# Patient Record
Sex: Female | Born: 1950 | Race: White | Hispanic: No | Marital: Married | Smoking: Never smoker
Health system: Southern US, Community
[De-identification: ages and names within clinical notes are randomized; demographics above are authoritative.]

## PROBLEM LIST (undated history)

## (undated) DIAGNOSIS — I5189 Other ill-defined heart diseases: Secondary | ICD-10-CM

## (undated) DIAGNOSIS — Z85828 Personal history of other malignant neoplasm of skin: Secondary | ICD-10-CM

## (undated) DIAGNOSIS — R002 Palpitations: Secondary | ICD-10-CM

## (undated) DIAGNOSIS — M8580 Other specified disorders of bone density and structure, unspecified site: Secondary | ICD-10-CM

## (undated) DIAGNOSIS — R0981 Nasal congestion: Secondary | ICD-10-CM

## (undated) DIAGNOSIS — L570 Actinic keratosis: Secondary | ICD-10-CM

## (undated) DIAGNOSIS — C801 Malignant (primary) neoplasm, unspecified: Secondary | ICD-10-CM

## (undated) DIAGNOSIS — K219 Gastro-esophageal reflux disease without esophagitis: Secondary | ICD-10-CM

## (undated) DIAGNOSIS — G4730 Sleep apnea, unspecified: Secondary | ICD-10-CM

## (undated) DIAGNOSIS — I10 Essential (primary) hypertension: Secondary | ICD-10-CM

## (undated) DIAGNOSIS — R0789 Other chest pain: Secondary | ICD-10-CM

## (undated) DIAGNOSIS — T7840XA Allergy, unspecified, initial encounter: Secondary | ICD-10-CM

## (undated) DIAGNOSIS — N289 Disorder of kidney and ureter, unspecified: Secondary | ICD-10-CM

## (undated) HISTORY — DX: Palpitations: R00.2

## (undated) HISTORY — PX: BASAL CELL CARCINOMA EXCISION: SHX1214

## (undated) HISTORY — DX: Other chest pain: R07.89

## (undated) HISTORY — DX: Other ill-defined heart diseases: I51.89

## (undated) HISTORY — PX: TONSILLECTOMY AND ADENOIDECTOMY: SHX28

## (undated) HISTORY — DX: Gastro-esophageal reflux disease without esophagitis: K21.9

## (undated) HISTORY — DX: Allergy, unspecified, initial encounter: T78.40XA

## (undated) HISTORY — DX: Essential (primary) hypertension: I10

## (undated) HISTORY — DX: Actinic keratosis: L57.0

## (undated) HISTORY — DX: Other specified disorders of bone density and structure, unspecified site: M85.80

## (undated) HISTORY — DX: Sleep apnea, unspecified: G47.30

## (undated) HISTORY — DX: Nasal congestion: R09.81

---
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
## 2000-02-27 DIAGNOSIS — Z85828 Personal history of other malignant neoplasm of skin: Secondary | ICD-10-CM

## 2000-02-27 HISTORY — DX: Personal history of other malignant neoplasm of skin: Z85.828

## 2006-03-21 ENCOUNTER — Encounter: Payer: Self-pay | Admitting: General Practice

## 2006-03-29 ENCOUNTER — Encounter: Payer: Self-pay | Admitting: General Practice

## 2008-09-27 ENCOUNTER — Ambulatory Visit: Payer: Self-pay | Admitting: Gastroenterology

## 2009-08-17 ENCOUNTER — Ambulatory Visit: Payer: Self-pay | Admitting: Family Medicine

## 2010-08-18 ENCOUNTER — Ambulatory Visit: Payer: Self-pay | Admitting: Family Medicine

## 2010-09-20 ENCOUNTER — Ambulatory Visit: Payer: Self-pay | Admitting: Family Medicine

## 2010-12-15 ENCOUNTER — Ambulatory Visit: Payer: Self-pay | Admitting: Anesthesiology

## 2010-12-22 ENCOUNTER — Ambulatory Visit: Payer: Self-pay | Admitting: Unknown Physician Specialty

## 2011-01-24 ENCOUNTER — Encounter: Payer: Self-pay | Admitting: Unknown Physician Specialty

## 2011-01-27 ENCOUNTER — Encounter: Payer: Self-pay | Admitting: Unknown Physician Specialty

## 2011-10-16 ENCOUNTER — Ambulatory Visit: Payer: Self-pay | Admitting: Family Medicine

## 2012-06-09 ENCOUNTER — Ambulatory Visit: Payer: Self-pay

## 2012-09-23 ENCOUNTER — Ambulatory Visit: Payer: Self-pay | Admitting: Family Medicine

## 2012-10-16 ENCOUNTER — Ambulatory Visit: Payer: Self-pay | Admitting: Family Medicine

## 2013-10-22 ENCOUNTER — Ambulatory Visit: Payer: Self-pay | Admitting: Family Medicine

## 2013-12-25 ENCOUNTER — Ambulatory Visit: Payer: Self-pay | Admitting: Unknown Physician Specialty

## 2014-01-29 ENCOUNTER — Ambulatory Visit: Payer: Self-pay | Admitting: Unknown Physician Specialty

## 2014-10-13 ENCOUNTER — Encounter: Payer: Self-pay | Admitting: Family Medicine

## 2014-10-13 ENCOUNTER — Ambulatory Visit (INDEPENDENT_AMBULATORY_CARE_PROVIDER_SITE_OTHER): Payer: PRIVATE HEALTH INSURANCE | Admitting: Family Medicine

## 2014-10-13 VITALS — BP 136/74 | HR 81 | Temp 98.7°F | Ht 63.3 in | Wt 141.0 lb

## 2014-10-13 DIAGNOSIS — Z0000 Encounter for general adult medical examination without abnormal findings: Secondary | ICD-10-CM

## 2014-10-13 DIAGNOSIS — G4733 Obstructive sleep apnea (adult) (pediatric): Secondary | ICD-10-CM

## 2014-10-13 DIAGNOSIS — Z9989 Dependence on other enabling machines and devices: Secondary | ICD-10-CM

## 2014-10-13 NOTE — Progress Notes (Signed)
   BP 136/74 mmHg  Pulse 81  Temp(Src) 98.7 F (37.1 C)  Ht 5' 3.3" (1.608 m)  Wt 141 lb (63.957 kg)  BMI 24.74 kg/m2  SpO2 99%   Subjective:    Patient ID: Terri Melendez, female    DOB: [birth year], 64 y.o.   MRN: [id number]  HPI: Terri Melendez is a 64 y.o. female  Chief Complaint  Patient presents with  . Annual Exam   patient doing well with no specific complaints has recently been diagnosed with  sleep apnea using CPAP and doing well. Uses faithfully. Taking allergy shots and nose spray with good relief.   Relevant past medical, surgical, family and social history reviewed and updated as indicated. Interim medical history since our last visit reviewed. Allergies and medications reviewed and updated.  Review of Systems  Constitutional: Negative.   HENT: Negative.   Eyes: Negative.   Respiratory: Negative.   Cardiovascular: Negative.   Gastrointestinal: Negative.   Endocrine: Negative.   Genitourinary: Negative.   Musculoskeletal: Negative.   Skin: Negative.   Allergic/Immunologic: Negative.   Neurological: Negative.   Hematological: Negative.   Psychiatric/Behavioral: Negative.     Per HPI unless specifically indicated above     Objective:    BP 136/74 mmHg  Pulse 81  Temp(Src) 98.7 F (37.1 C)  Ht 5' 3.3" (1.608 m)  Wt 141 lb (63.957 kg)  BMI 24.74 kg/m2  SpO2 99%  Wt Readings from Last 3 Encounters:  10/13/14 141 lb (63.957 kg)  09/08/13 139 lb (63.05 kg)    Physical Exam  Constitutional: She is oriented to person, place, and time. She appears well-developed and well-nourished.  HENT:  Head: Normocephalic and atraumatic.  Right Ear: External ear normal.  Left Ear: External ear normal.  Nose: Nose normal.  Mouth/Throat: Oropharynx is clear and moist.  Eyes: Conjunctivae and EOM are normal. Pupils are equal, round, and reactive to light.  Neck: Normal range of motion. Neck supple. Carotid bruit is not present.  Cardiovascular:  Normal rate, regular rhythm and normal heart sounds.   No murmur heard. Pulmonary/Chest: Effort normal and breath sounds normal.  Abdominal: Soft. Bowel sounds are normal. There is no hepatosplenomegaly.  Musculoskeletal: Normal range of motion.  Neurological: She is alert and oriented to person, place, and time.  Skin: No rash noted.  Psychiatric: She has a normal mood and affect. Her behavior is normal. Judgment and thought content normal.    No results found for this or any previous visit.    Assessment & Plan:   Problem List Items Addressed This Visit      Respiratory   OSA on CPAP    The current medical regimen is effective;  continue present plan and medications.        Other Visit Diagnoses    PE (physical exam), annual    -  Primary      patient will have physical exam labs done at work  Follow up plan: Return if symptoms worsen or fail to improve.

## 2014-10-13 NOTE — Assessment & Plan Note (Signed)
The current medical regimen is effective;  continue present plan and medications.  

## 2015-02-14 ENCOUNTER — Encounter: Payer: Self-pay | Admitting: Physician Assistant

## 2015-02-14 ENCOUNTER — Ambulatory Visit: Payer: Self-pay | Admitting: Physician Assistant

## 2015-02-14 VITALS — BP 120/70 | HR 111 | Temp 98.9°F

## 2015-02-14 DIAGNOSIS — J069 Acute upper respiratory infection, unspecified: Secondary | ICD-10-CM

## 2015-02-14 MED ORDER — FLUTICASONE PROPIONATE 50 MCG/ACT NA SUSP: 2.0000 | Freq: Every day | NASAL | Status: DC

## 2015-02-14 MED ORDER — AZITHROMYCIN 250 MG PO TABS: ORAL_TABLET | ORAL | Status: DC

## 2015-02-14 NOTE — Progress Notes (Signed)
S: C/o runny nose and congestion for 3 days, + fever, chills, temp was around 100, denies cp/sob, v/d; mucus is mostly clear and bloody, cough is sporadic, c/o of facial and dental pain.   Using otc meds: sudafed, afrin  O: PE: vitals w elevated hr perrl eomi, normocephalic, tms dull, nasal mucosa red and swollen, throat injected, neck supple no lymph, lungs c t a, cv rrr, neuro intact  A:  Acute sinusitis, uri   P: zpack, flonase, stop sudafed; drink fluids, continue regular meds , use otc meds of choice, return if not improving in 5 days, return earlier if worsening

## 2015-04-18 ENCOUNTER — Ambulatory Visit: Payer: Self-pay | Admitting: Physician Assistant

## 2015-04-18 ENCOUNTER — Encounter: Payer: Self-pay | Admitting: Physician Assistant

## 2015-04-18 VITALS — BP 150/70 | HR 91 | Temp 98.3°F

## 2015-04-18 DIAGNOSIS — M545 Low back pain, unspecified: Secondary | ICD-10-CM

## 2015-04-18 LAB — POCT URINALYSIS DIPSTICK
BILIRUBIN UA: NEGATIVE
GLUCOSE UA: NEGATIVE
Ketones, UA: NEGATIVE
LEUKOCYTES UA: NEGATIVE
NITRITE UA: NEGATIVE
PH UA: 5.5
Protein, UA: NEGATIVE
Spec Grav, UA: 1.01
UROBILINOGEN UA: 0.2

## 2015-04-18 MED ORDER — MELOXICAM 15 MG PO TABS: 15.0000 mg | ORAL_TABLET | Freq: Every day | ORAL | Status: DC

## 2015-04-18 MED ORDER — CYCLOBENZAPRINE HCL 10 MG PO TABS: 10.0000 mg | ORAL_TABLET | Freq: Three times a day (TID) | ORAL | Status: DC | PRN

## 2015-04-18 NOTE — Progress Notes (Signed)
S:  C/o low back pain for few days no known injury, pain is worse with movement, increased with bending over, denies numbness, tingling, or changes in bowel/urinary habits, pain wrap around hip and into llq; tried a tylenol and aleve without any relief, pain makes it difficult to sleep bc she can't get comfortable, denies hx of kidney stones, no uti sx Remainder ros neg  O:  Vitals wnl, nad, lungs c t a, cv rrr, spine nontender, muscles in lower back spasmed , decreased rom with,  Neg slr, pt walks without difficulty, no foot drop noted, n/v intact, ua 1+ blood  A: acute back pain, hematuria  P: flexeril , mobic, use wet heat followed by ice, stretches, return to clinic if not better in 3 t 5 days, return earlier if worsening, return in one week to evaluate blood in urine, no hx of stones

## 2015-04-18 NOTE — Addendum Note (Signed)
Addended by: Rudene Anda T on: 04/18/2015 11:49 AM   Modules accepted: Orders

## 2015-04-21 ENCOUNTER — Ambulatory Visit: Payer: PRIVATE HEALTH INSURANCE | Admitting: Family Medicine

## 2015-04-25 ENCOUNTER — Encounter: Payer: Self-pay | Admitting: Physician Assistant

## 2015-04-25 ENCOUNTER — Ambulatory Visit: Payer: Self-pay | Admitting: Physician Assistant

## 2015-04-25 VITALS — BP 160/80 | HR 106 | Temp 97.9°F

## 2015-04-25 DIAGNOSIS — R319 Hematuria, unspecified: Secondary | ICD-10-CM

## 2015-04-25 LAB — POCT URINALYSIS DIPSTICK
Bilirubin, UA: NEGATIVE
Glucose, UA: NEGATIVE
Ketones, UA: NEGATIVE
Leukocytes, UA: NEGATIVE
Nitrite, UA: NEGATIVE
Protein, UA: NEGATIVE
Spec Grav, UA: 1.015
Urobilinogen, UA: 0.2
pH, UA: 6.5

## 2015-04-25 NOTE — Progress Notes (Signed)
   Subjective: back pain    Patient ID: Terri Melendez, female    DOB: [birth year], 65 y.o.   MRN: [id number]  HPI Patient follow up status one week for back pain. Patient was prescribed Mobic and Flexeril. States back pain has resolved. Also there was a concern 2nd to 1+ blood on dip UA on day of last visit. Patient denies urinary compliant.   Review of Systems Back back and GERD    Objective:   Physical Exam No acute distress. No spinal deformity, no CVA guarding, F/E ROM without spasms. Dip UA revealed Trace Blood.       Assessment & Plan: Resolved back pain  Discontinue Mobic and Flexeril.  Follow up with Family Doctor.

## 2015-09-20 ENCOUNTER — Encounter (INDEPENDENT_AMBULATORY_CARE_PROVIDER_SITE_OTHER): Payer: Self-pay

## 2015-10-05 ENCOUNTER — Ambulatory Visit (INDEPENDENT_AMBULATORY_CARE_PROVIDER_SITE_OTHER): Payer: Managed Care, Other (non HMO) | Admitting: Family Medicine

## 2015-10-05 ENCOUNTER — Encounter: Payer: Self-pay | Admitting: Family Medicine

## 2015-10-05 VITALS — BP 150/78 | HR 81 | Temp 98.0°F | Wt 141.0 lb

## 2015-10-05 DIAGNOSIS — M545 Low back pain, unspecified: Secondary | ICD-10-CM

## 2015-10-05 DIAGNOSIS — S39012A Strain of muscle, fascia and tendon of lower back, initial encounter: Secondary | ICD-10-CM | POA: Diagnosis not present

## 2015-10-05 DIAGNOSIS — S90122A Contusion of left lesser toe(s) without damage to nail, initial encounter: Secondary | ICD-10-CM | POA: Diagnosis not present

## 2015-10-05 LAB — URINALYSIS, ROUTINE W REFLEX MICROSCOPIC
BILIRUBIN UA: NEGATIVE
GLUCOSE, UA: NEGATIVE
KETONES UA: NEGATIVE
Leukocytes, UA: NEGATIVE
Nitrite, UA: NEGATIVE
PH UA: 5 (ref 5.0–7.5)
PROTEIN UA: NEGATIVE
Specific Gravity, UA: <1.005 — ABNORMAL LOW (ref 1.005–1.030)
UUROB: 0.2 mg/dL (ref 0.2–1.0)

## 2015-10-05 LAB — MICROSCOPIC EXAMINATION: WBC UA: NONE SEEN /HPF (ref 0–?)

## 2015-10-05 NOTE — Progress Notes (Signed)
BP (!) 150/78 (BP Location: Left Arm, Patient Position: Sitting, Cuff Size: Small)   Pulse 81   Temp 98 F (36.7 C)   Wt 141 lb (64 kg)   SpO2 96%   BMI 24.74 kg/m    Subjective:    Patient ID: Terri Melendez, female    DOB: Sep 23, 1950, 65 y.o.   MRN: [id number]  HPI: ARYKAH Melendez is a 65 y.o. female  Chief Complaint  Patient presents with  . Back Pain    lower back, right hip  Patient approximately 5 years ago developed some right hip pain treated with physical therapy got better and nearly completely resolved. Over the last year has had some intermittent right hip pain something like what was going on 5 years ago has done some home PT exercises which helped and is only intermittent problems now. In February this year some unknown incident caused right lower back pain treated in the employee clinic with meloxicam and Flexeril which to helped and symptoms resolved after a week or so. Patient had normal urinalysis during this time which was done 2 times to confirm everything was normal. All was well except for intermittent right hip pain until 5 days ago with slipping and sock feet on her floor at home stubbing her left second toe not falling but catching her self and reaching her back with acute onset of pain in the same area right lower back. Had some leftover Flexeril and meloxicam which is helped is somewhat better. Has not noticed any blood in stool or urine has some mild bowel changes this week but nothing particular. No radicular symptoms no weakness noted in legs. No loss of bowel or bladder control.  Relevant past medical, surgical, family and social history reviewed and updated as indicated. Interim medical history since our last visit reviewed. Allergies and medications reviewed and updated.  Review of Systems  Constitutional: Negative.   Respiratory: Negative.   Cardiovascular: Negative.     Per HPI unless specifically indicated above     Objective:    BP (!) 150/78 (BP Location: Left Arm, Patient Position: Sitting, Cuff Size: Small)   Pulse 81   Temp 98 F (36.7 C)   Wt 141 lb (64 kg)   SpO2 96%   BMI 24.74 kg/m   Wt Readings from Last 3 Encounters:  10/05/15 141 lb (64 kg)  10/13/14 141 lb (64 kg)  09/08/13 139 lb (63 kg)    Physical Exam  Constitutional: She is oriented to person, place, and time. She appears well-developed and well-nourished. No distress.  HENT:  Head: Normocephalic and atraumatic.  Right Ear: Hearing normal.  Left Ear: Hearing normal.  Nose: Nose normal.  Eyes: Conjunctivae and lids are normal. Right eye exhibits no discharge. Left eye exhibits no discharge. No scleral icterus.  Pulmonary/Chest: Effort normal. No respiratory distress.  Abdominal: Soft. Bowel sounds are normal. She exhibits no distension. There is no tenderness.  Musculoskeletal: Normal range of motion.  Back area clear no rash, DTRs normal normal musculoskeletal strength motion no pain with straight leg raising  Neurological: She is alert and oriented to person, place, and time.  Skin: Skin is intact. No rash noted.  Psychiatric: She has a normal mood and affect. Her speech is normal and behavior is normal. Judgment and thought content normal. Cognition and memory are normal.    Results for orders placed or performed in visit on 04/25/15  POCT Urinalysis Dipstick (CPT 81002)  Result Value Ref Range  Color, UA Yellow    Clarity, UA Clear    Glucose, UA Negative    Bilirubin, UA Negative    Ketones, UA Negative    Spec Grav, UA 1.015    Blood, UA Trace    pH, UA 6.5    Protein, UA Negative    Urobilinogen, UA 0.2    Nitrite, UA Negative    Leukocytes, UA Negative Negative  Reviewed urine which is clear    Assessment & Plan:   Problem List Items Addressed This Visit    None    Visit Diagnoses    Right-sided low back pain without sciatica    -  Primary   Relevant Orders   Urinalysis, Routine w reflex microscopic (not at  [facility])   Back strain, initial encounter       Patient had on back care exercise activity lifting use of medications has cyclobenzaprine and meloxicam   Toe contusion, left, initial encounter       Discussed toe contusion possible loss of toenail will observe patient education given       Follow up plan: Return for Physical Exam, As scheduled.

## 2015-10-07 ENCOUNTER — Encounter: Payer: Self-pay | Admitting: Family Medicine

## 2015-10-14 ENCOUNTER — Other Ambulatory Visit: Payer: Self-pay

## 2015-10-14 DIAGNOSIS — Z0000 Encounter for general adult medical examination without abnormal findings: Secondary | ICD-10-CM

## 2015-10-14 NOTE — Progress Notes (Signed)
Patient came in to have blood drawn for testing per Dr. Elta Guadeloupe Crissman's orders.  Patient wants results sent to Dr. Jeananne Rama when they are finalized.

## 2015-10-15 LAB — CMP12+LP+TP+TSH+6AC+CBC/D/PLT
A/G RATIO: 2 (ref 1.2–2.2)
ALBUMIN: 4.5 g/dL (ref 3.6–4.8)
ALT: 14 IU/L (ref 0–32)
AST: 16 IU/L (ref 0–40)
Alkaline Phosphatase: 113 IU/L (ref 39–117)
BASOS ABS: 0 10*3/uL (ref 0.0–0.2)
BUN/Creatinine Ratio: 21 (ref 12–28)
BUN: 14 mg/dL (ref 8–27)
Basos: 0 %
Bilirubin Total: 0.3 mg/dL (ref 0.0–1.2)
CALCIUM: 9.2 mg/dL (ref 8.7–10.3)
CHOL/HDL RATIO: 3.5 ratio (ref 0.0–4.4)
CHOLESTEROL TOTAL: 227 mg/dL — AB (ref 100–199)
Chloride: 100 mmol/L (ref 96–106)
Creatinine, Ser: 0.67 mg/dL (ref 0.57–1.00)
EOS (ABSOLUTE): 0.3 10*3/uL (ref 0.0–0.4)
Eos: 4 %
Estimated CHD Risk: 0.6 times avg. (ref 0.0–1.0)
FREE THYROXINE INDEX: 1.9 (ref 1.2–4.9)
GFR calc non Af Amer: 94 mL/min/{1.73_m2} (ref >59)
GFR, EST AFRICAN AMERICAN: 108 mL/min/{1.73_m2} (ref >59)
GGT: 16 IU/L (ref 0–60)
GLOBULIN, TOTAL: 2.2 g/dL (ref 1.5–4.5)
Glucose: 86 mg/dL (ref 65–99)
HDL: 65 mg/dL (ref >39)
Hematocrit: 38 % (ref 34.0–46.6)
Hemoglobin: 12.6 g/dL (ref 11.1–15.9)
IMMATURE GRANS (ABS): 0 10*3/uL (ref 0.0–0.1)
IMMATURE GRANULOCYTES: 0 %
Iron: 81 ug/dL (ref 27–139)
LDH: 178 IU/L (ref 119–226)
LDL Calculated: 146 mg/dL — ABNORMAL HIGH (ref 0–99)
LYMPHS: 24 %
Lymphocytes Absolute: 1.5 10*3/uL (ref 0.7–3.1)
MCH: 29 pg (ref 26.6–33.0)
MCHC: 33.2 g/dL (ref 31.5–35.7)
MCV: 88 fL (ref 79–97)
MONOCYTES: 7 %
MONOS ABS: 0.5 10*3/uL (ref 0.1–0.9)
NEUTROS ABS: 3.9 10*3/uL (ref 1.4–7.0)
NEUTROS PCT: 65 %
PHOSPHORUS: 3.4 mg/dL (ref 2.5–4.5)
PLATELETS: 263 10*3/uL (ref 150–379)
POTASSIUM: 5.1 mmol/L (ref 3.5–5.2)
RBC: 4.34 x10E6/uL (ref 3.77–5.28)
RDW: 14.1 % (ref 12.3–15.4)
Sodium: 139 mmol/L (ref 134–144)
T3 UPTAKE RATIO: 27 % (ref 24–39)
T4 TOTAL: 7.1 ug/dL (ref 4.5–12.0)
TRIGLYCERIDES: 82 mg/dL (ref 0–149)
TSH: 1.54 u[IU]/mL (ref 0.450–4.500)
Total Protein: 6.7 g/dL (ref 6.0–8.5)
Uric Acid: 4.9 mg/dL (ref 2.5–7.1)
VLDL Cholesterol Cal: 16 mg/dL (ref 5–40)
WBC: 6.1 10*3/uL (ref 3.4–10.8)

## 2015-10-15 LAB — HIV ANTIBODY (ROUTINE TESTING W REFLEX): HIV SCREEN 4TH GENERATION: NONREACTIVE

## 2015-10-15 LAB — URINALYSIS, COMPLETE
Bilirubin, UA: NEGATIVE
GLUCOSE, UA: NEGATIVE
KETONES UA: NEGATIVE
Leukocytes, UA: NEGATIVE
NITRITE UA: NEGATIVE
Protein, UA: NEGATIVE
SPEC GRAV UA: 1.012 (ref 1.005–1.030)
Urobilinogen, Ur: 0.2 mg/dL (ref 0.2–1.0)
pH, UA: 7 (ref 5.0–7.5)

## 2015-10-15 LAB — MICROSCOPIC EXAMINATION
CASTS: NONE SEEN /LPF
WBC, UA: NONE SEEN /hpf (ref 0–?)

## 2015-10-15 LAB — HEPATITIS C ANTIBODY (REFLEX): HCV Ab: <0.1 s/co ratio (ref 0.0–0.9)

## 2015-10-15 LAB — HCV COMMENT:

## 2015-10-17 ENCOUNTER — Ambulatory Visit (INDEPENDENT_AMBULATORY_CARE_PROVIDER_SITE_OTHER): Payer: Managed Care, Other (non HMO) | Admitting: Family Medicine

## 2015-10-17 ENCOUNTER — Encounter: Payer: Self-pay | Admitting: Family Medicine

## 2015-10-17 VITALS — BP 149/75 | HR 85 | Temp 98.0°F | Ht 63.8 in | Wt 139.0 lb

## 2015-10-17 DIAGNOSIS — G4733 Obstructive sleep apnea (adult) (pediatric): Secondary | ICD-10-CM

## 2015-10-17 DIAGNOSIS — Z13820 Encounter for screening for osteoporosis: Secondary | ICD-10-CM | POA: Diagnosis not present

## 2015-10-17 DIAGNOSIS — Z1231 Encounter for screening mammogram for malignant neoplasm of breast: Secondary | ICD-10-CM | POA: Diagnosis not present

## 2015-10-17 DIAGNOSIS — Z23 Encounter for immunization: Secondary | ICD-10-CM | POA: Diagnosis not present

## 2015-10-17 DIAGNOSIS — Z9989 Dependence on other enabling machines and devices: Secondary | ICD-10-CM

## 2015-10-17 NOTE — Progress Notes (Signed)
BP (!) 149/75 (BP Location: Left Arm, Patient Position: Sitting, Cuff Size: Small)   Pulse 85   Temp 98 F (36.7 C)   Ht 5' 3.8" (1.621 m)   Wt 139 lb (63 kg)   SpO2 99%   BMI 24.01 kg/m    Subjective:    Patient ID: [name], female    DOB: Oct 29, 1950, 65 y.o.   MRN: [id number]  HPI: NAKEMA BRUNETTE is a 64 y.o. female  Chief Complaint  Patient presents with  . Annual Exam   all in all doing well using CPAP without problems Uses rare Flexeril and Prilosec. Does use Nasonex every day  Relevant past medical, surgical, family and social history reviewed and updated as indicated. Interim medical history since our last visit reviewed. Allergies and medications reviewed and updated.  Review of Systems  Constitutional: Negative.   HENT: Negative.   Eyes: Negative.   Respiratory: Negative.   Cardiovascular: Negative.   Gastrointestinal: Negative.   Endocrine: Negative.   Genitourinary: Negative.   Musculoskeletal: Negative.   Skin: Negative.   Allergic/Immunologic: Negative.   Neurological: Negative.   Hematological: Negative.   Psychiatric/Behavioral: Negative.     Per HPI unless specifically indicated above     Objective:    BP (!) 149/75 (BP Location: Left Arm, Patient Position: Sitting, Cuff Size: Small)   Pulse 85   Temp 98 F (36.7 C)   Ht 5' 3.8" (1.621 m)   Wt 139 lb (63 kg)   SpO2 99%   BMI 24.01 kg/m   Wt Readings from Last 3 Encounters:  10/17/15 139 lb (63 kg)  10/05/15 141 lb (64 kg)  10/13/14 141 lb (64 kg)    Physical Exam  Constitutional: She is oriented to person, place, and time. She appears well-developed and well-nourished.  HENT:  Head: Normocephalic and atraumatic.  Right Ear: External ear normal.  Left Ear: External ear normal.  Nose: Nose normal.  Mouth/Throat: Oropharynx is clear and moist.  Eyes: Conjunctivae and EOM are normal. Pupils are equal, round, and reactive to light.  Neck: Normal range of motion.  Neck supple. Carotid bruit is not present.  Cardiovascular: Normal rate, regular rhythm and normal heart sounds.   No murmur heard. Pulmonary/Chest: Effort normal and breath sounds normal. She exhibits no mass. Right breast exhibits no mass, no skin change and no tenderness. Left breast exhibits no mass, no skin change and no tenderness. Breasts are symmetrical.  Abdominal: Soft. Bowel sounds are normal. There is no hepatosplenomegaly.  Musculoskeletal: Normal range of motion.  Neurological: She is alert and oriented to person, place, and time.  Skin: No rash noted.  Psychiatric: She has a normal mood and affect. Her behavior is normal. Judgment and thought content normal.    Results for orders placed or performed in visit on 10/14/15  Microscopic Examination  Result Value Ref Range   WBC, UA None seen 0 - 5 /hpf   RBC, UA 3-10 (A) 0 - 2 /hpf   Epithelial Cells (non renal) 0-10 0 - 10 /hpf   Casts None seen None seen /lpf   Mucus, UA Present Not Estab.   Bacteria, UA Few None seen/Few  Executive Panel  Result Value Ref Range   Glucose 86 65 - 99 mg/dL   Uric Acid 4.9 2.5 - 7.1 mg/dL   BUN 14 8 - 27 mg/dL   Creatinine, Ser 0.67 0.57 - 1.00 mg/dL   GFR calc non Af Amer 94 >59 mL/min/1.73  GFR calc Af Amer 108 >59 mL/min/1.73   BUN/Creatinine Ratio 21 12 - 28   Sodium 139 134 - 144 mmol/L   Potassium 5.1 3.5 - 5.2 mmol/L   Chloride 100 96 - 106 mmol/L   Calcium 9.2 8.7 - 10.3 mg/dL   Phosphorus 3.4 2.5 - 4.5 mg/dL   Total Protein 6.7 6.0 - 8.5 g/dL   Albumin 4.5 3.6 - 4.8 g/dL   Globulin, Total 2.2 1.5 - 4.5 g/dL   Albumin/Globulin Ratio 2.0 1.2 - 2.2   Bilirubin Total 0.3 0.0 - 1.2 mg/dL   Alkaline Phosphatase 113 39 - 117 IU/L   LDH 178 119 - 226 IU/L   AST 16 0 - 40 IU/L   ALT 14 0 - 32 IU/L   GGT 16 0 - 60 IU/L   Iron 81 27 - 139 ug/dL   Cholesterol, Total 227 (H) 100 - 199 mg/dL   Triglycerides 82 0 - 149 mg/dL   HDL 65 >39 mg/dL   VLDL Cholesterol Cal 16 5 - 40  mg/dL   LDL Calculated 146 (H) 0 - 99 mg/dL   Chol/HDL Ratio 3.5 0.0 - 4.4 ratio units   Estimated CHD Risk 0.6 0.0 - 1.0  times avg.   TSH 1.540 0.450 - 4.500 uIU/mL   T4, Total 7.1 4.5 - 12.0 ug/dL   T3 Uptake Ratio 27 24 - 39 %   Free Thyroxine Index 1.9 1.2 - 4.9   WBC 6.1 3.4 - 10.8 x10E3/uL   RBC 4.34 3.77 - 5.28 x10E6/uL   Hemoglobin 12.6 11.1 - 15.9 g/dL   Hematocrit 38.0 34.0 - 46.6 %   MCV 88 79 - 97 fL   MCH 29.0 26.6 - 33.0 pg   MCHC 33.2 31.5 - 35.7 g/dL   RDW 14.1 12.3 - 15.4 %   Platelets 263 150 - 379 x10E3/uL   Neutrophils 65 %   Lymphs 24 %   Monocytes 7 %   Eos 4 %   Basos 0 %   Neutrophils Absolute 3.9 1.4 - 7.0 x10E3/uL   Lymphocytes Absolute 1.5 0.7 - 3.1 x10E3/uL   Monocytes Absolute 0.5 0.1 - 0.9 x10E3/uL   EOS (ABSOLUTE) 0.3 0.0 - 0.4 x10E3/uL   Basophils Absolute 0.0 0.0 - 0.2 x10E3/uL   Immature Granulocytes 0 %   Immature Grans (Abs) 0.0 0.0 - 0.1 x10E3/uL  Urinalysis, Complete  Result Value Ref Range   Specific Gravity, UA 1.012 1.005 - 1.030   pH, UA 7.0 5.0 - 7.5   Color, UA Yellow Yellow   Appearance Ur Clear Clear   Leukocytes, UA Negative Negative   Protein, UA Negative Negative/Trace   Glucose, UA Negative Negative   Ketones, UA Negative Negative   RBC, UA 1+ (A) Negative   Bilirubin, UA Negative Negative   Urobilinogen, Ur 0.2 0.2 - 1.0 mg/dL   Nitrite, UA Negative Negative   Microscopic Examination See below:   Hepatitis C antibody (reflex)  Result Value Ref Range   HCV Ab <0.1 0.0 - 0.9 s/co ratio  HIV antibody (with reflex)  Result Value Ref Range   HIV Screen 4th Generation wRfx Non Reactive Non Reactive  HCV Comment:  Result Value Ref Range   Comment: Comment       Assessment & Plan:   Problem List Items Addressed This Visit      Respiratory   OSA on CPAP    The current medical regimen is effective;  continue present plan and medications.  Other Visit Diagnoses    Need for Tdap vaccination    -  Primary     Relevant Orders   Tdap vaccine greater than or equal to 7yo IM (Completed)   Encounter for screening mammogram for breast cancer       Relevant Orders   MM Digital Screening   Encounter for screening for osteoporosis       Relevant Orders   DG Bone Density       Follow up plan: Return in about 1 year (around 10/16/2016), or if symptoms worsen or fail to improve, for Physical Exam.

## 2015-10-17 NOTE — Patient Instructions (Signed)
Tdap Vaccine (Tetanus, Diphtheria and Pertussis): What You Need to Know 1. Why get vaccinated? Tetanus, diphtheria and pertussis are very serious diseases. Tdap vaccine can protect us from these diseases. And, Tdap vaccine given to pregnant women can protect newborn babies against pertussis. TETANUS (Lockjaw) is rare in the [place] today. It causes painful muscle tightening and stiffness, usually all over the body.  It can lead to tightening of muscles in the head and neck so you can't open your mouth, swallow, or sometimes even breathe. Tetanus kills about 1 out of 10 people who are infected even after receiving the best medical care. DIPHTHERIA is also rare in the [place] today. It can cause a thick coating to form in the back of the throat.  It can lead to breathing problems, heart failure, paralysis, and death. PERTUSSIS (Whooping Cough) causes severe coughing spells, which can cause difficulty breathing, vomiting and disturbed sleep.  It can also lead to weight loss, incontinence, and rib fractures. Up to 2 in 100 adolescents and 5 in 100 adults with pertussis are hospitalized or have complications, which could include pneumonia or death. These diseases are caused by bacteria. Diphtheria and pertussis are spread from person to person through secretions from coughing or sneezing. Tetanus enters the body through cuts, scratches, or wounds. Before vaccines, as many as 200,000 cases of diphtheria, 200,000 cases of pertussis, and hundreds of cases of tetanus, were reported in the [place] each year. Since vaccination began, reports of cases for tetanus and diphtheria have dropped by about 99% and for pertussis by about 80%. 2. Tdap vaccine Tdap vaccine can protect adolescents and adults from tetanus, diphtheria, and pertussis. One dose of Tdap is routinely given at age 11 or 12. People who did not get Tdap at that age should get it as soon as possible. Tdap is especially important  for healthcare professionals and anyone having close contact with a baby younger than 12 months. Pregnant women should get a dose of Tdap during every pregnancy, to protect the newborn from pertussis. Infants are most at risk for severe, life-threatening complications from pertussis. Another vaccine, called Td, protects against tetanus and diphtheria, but not pertussis. A Td booster should be given every 10 years. Tdap may be given as one of these boosters if you have never gotten Tdap before. Tdap may also be given after a severe cut or burn to prevent tetanus infection. Your doctor or the person giving you the vaccine can give you more information. Tdap may safely be given at the same time as other vaccines. 3. Some people should not get this vaccine  A person who has ever had a life-threatening allergic reaction after a previous dose of any diphtheria, tetanus or pertussis containing vaccine, OR has a severe allergy to any part of this vaccine, should not get Tdap vaccine. Tell the person giving the vaccine about any severe allergies.  Anyone who had coma or long repeated seizures within 7 days after a childhood dose of DTP or DTaP, or a previous dose of Tdap, should not get Tdap, unless a cause other than the vaccine was found. They can still get Td.  Talk to your doctor if you:  have seizures or another nervous system problem,  had severe pain or swelling after any vaccine containing diphtheria, tetanus or pertussis,  ever had a condition called Guillain-Barr Syndrome (GBS),  aren't feeling well on the day the shot is scheduled. 4. Risks With any medicine, including vaccines, there is   a chance of side effects. These are usually mild and go away on their own. Serious reactions are also possible but are rare. Most people who get Tdap vaccine do not have any problems with it. Mild problems following Tdap (Did not interfere with activities)  Pain where the shot was given (about 3 in 4  adolescents or 2 in 3 adults)  Redness or swelling where the shot was given (about 1 person in 5)  Mild fever of at least 100.4F (up to about 1 in 25 adolescents or 1 in 100 adults)  Headache (about 3 or 4 people in 10)  Tiredness (about 1 person in 3 or 4)  Nausea, vomiting, diarrhea, stomach ache (up to 1 in 4 adolescents or 1 in 10 adults)  Chills, sore joints (about 1 person in 10)  Body aches (about 1 person in 3 or 4)  Rash, swollen glands (uncommon) Moderate problems following Tdap (Interfered with activities, but did not require medical attention)  Pain where the shot was given (up to 1 in 5 or 6)  Redness or swelling where the shot was given (up to about 1 in 16 adolescents or 1 in 12 adults)  Fever over 102F (about 1 in 100 adolescents or 1 in 250 adults)  Headache (about 1 in 7 adolescents or 1 in 10 adults)  Nausea, vomiting, diarrhea, stomach ache (up to 1 or 3 people in 100)  Swelling of the entire arm where the shot was given (up to about 1 in 500). Severe problems following Tdap (Unable to perform usual activities; required medical attention)  Swelling, severe pain, bleeding and redness in the arm where the shot was given (rare). Problems that could happen after any vaccine:  People sometimes faint after a medical procedure, including vaccination. Sitting or lying down for about 15 minutes can help prevent fainting, and injuries caused by a fall. Tell your doctor if you feel dizzy, or have vision changes or ringing in the ears.  Some people get severe pain in the shoulder and have difficulty moving the arm where a shot was given. This happens very rarely.  Any medication can cause a severe allergic reaction. Such reactions from a vaccine are very rare, estimated at fewer than 1 in a million doses, and would happen within a few minutes to a few hours after the vaccination. As with any medicine, there is a very remote chance of a vaccine causing a serious  injury or death. The safety of vaccines is always being monitored. For more information, visit: www.cdc.gov/vaccinesafety/ 5. What if there is a serious problem? What should I look for?  Look for anything that concerns you, such as signs of a severe allergic reaction, very high fever, or unusual behavior.  Signs of a severe allergic reaction can include hives, swelling of the face and throat, difficulty breathing, a fast heartbeat, dizziness, and weakness. These would usually start a few minutes to a few hours after the vaccination. What should I do?  If you think it is a severe allergic reaction or other emergency that can't wait, call 9-1-1 or get the person to the nearest hospital. Otherwise, call your doctor.  Afterward, the reaction should be reported to the Vaccine Adverse Event Reporting System (VAERS). Your doctor might file this report, or you can do it yourself through the VAERS web site at www.vaers.hhs.gov, or by calling 1-800-822-7967. VAERS does not give medical advice.  6. The National Vaccine Injury Compensation Program The National Vaccine Injury Compensation Program (  VICP) is a federal program that was created to compensate people who may have been injured by certain vaccines. Persons who believe they may have been injured by a vaccine can learn about the program and about filing a claim by calling 1-800-338-2382 or visiting the VICP website at www.hrsa.gov/vaccinecompensation. There is a time limit to file a claim for compensation. 7. How can I learn more?  Ask your doctor. He or she can give you the vaccine package insert or suggest other sources of information.  Call your local or state health department.  Contact the Centers for Disease Control and Prevention (CDC):  Call 1-800-232-4636 (1-800-CDC-INFO) or  Visit CDC's website at www.cdc.gov/vaccines CDC Tdap Vaccine VIS (04/21/13)   This information is not intended to replace advice given to you by your health care  provider. Make sure you discuss any questions you have with your health care provider.   Document Released: 08/14/2011 Document Revised: 03/05/2014 Document Reviewed: 05/27/2013 Elsevier Interactive Patient Education 2016 [employer]  

## 2015-10-17 NOTE — Assessment & Plan Note (Signed)
The current medical regimen is effective;  continue present plan and medications.  

## 2015-11-17 ENCOUNTER — Other Ambulatory Visit: Payer: Self-pay | Admitting: Family Medicine

## 2015-11-17 ENCOUNTER — Ambulatory Visit
Admission: RE | Admit: 2015-11-17 | Discharge: 2015-11-17 | Disposition: A | Discharge status: Home / Self Care | Payer: Managed Care, Other (non HMO) | Source: Ambulatory Visit | Attending: Family Medicine | Admitting: Family Medicine

## 2015-11-17 DIAGNOSIS — Z1231 Encounter for screening mammogram for malignant neoplasm of breast: Secondary | ICD-10-CM | POA: Diagnosis not present

## 2015-11-17 DIAGNOSIS — M85852 Other specified disorders of bone density and structure, left thigh: Secondary | ICD-10-CM | POA: Diagnosis not present

## 2015-11-17 DIAGNOSIS — Z13820 Encounter for screening for osteoporosis: Secondary | ICD-10-CM | POA: Diagnosis present

## 2016-01-13 ENCOUNTER — Ambulatory Visit: Payer: Self-pay | Admitting: Physician Assistant

## 2016-01-13 DIAGNOSIS — Z299 Encounter for prophylactic measures, unspecified: Secondary | ICD-10-CM

## 2016-01-16 NOTE — Progress Notes (Signed)
Patient in office today for influenza shot only given IM left deltoid

## 2016-02-06 ENCOUNTER — Encounter: Payer: Self-pay | Admitting: Family Medicine

## 2016-05-07 ENCOUNTER — Encounter: Payer: Self-pay | Admitting: Physician Assistant

## 2016-05-07 ENCOUNTER — Ambulatory Visit: Payer: Self-pay | Admitting: Physician Assistant

## 2016-05-07 VITALS — BP 160/90 | HR 114 | Temp 98.5°F

## 2016-05-07 DIAGNOSIS — M25512 Pain in left shoulder: Secondary | ICD-10-CM

## 2016-05-07 MED ORDER — MELOXICAM 15 MG PO TABS: 15.0000 mg | ORAL_TABLET | Freq: Every day | ORAL | 0 refills | Status: DC

## 2016-05-07 MED ORDER — METHYLPREDNISOLONE 4 MG PO TBPK: ORAL_TABLET | ORAL | 0 refills | Status: DC

## 2016-05-07 MED ORDER — CYCLOBENZAPRINE HCL 10 MG PO TABS: 10.0000 mg | ORAL_TABLET | Freq: Three times a day (TID) | ORAL | 0 refills | Status: DC | PRN

## 2016-05-07 NOTE — Progress Notes (Signed)
S: c/o left shoulder and left arm pain for 5 days, thought she slept wrong at first, took mobic and flexeril which helped but they are both expired meds, pain reproduced with movement, some tingling into both arms but no weakness, denies cp/sob/neck or jaw pain, remainder ros neg  O: vitals wnl, nad, appears well, cspine normal, left shoulder tender at trapezious and supraspinatus, tender at area below scapula, grips = b/l, lungs c t a, cv rrr  A: acute left shoulder pain secondary to muscle spasm and ?bursitis  P: medrol dose pack, flexeril, rx for mobic, to only be used after finished with steroids if needed

## 2016-05-09 ENCOUNTER — Emergency Department: Payer: Managed Care, Other (non HMO)

## 2016-05-09 ENCOUNTER — Encounter: Payer: Self-pay | Admitting: Emergency Medicine

## 2016-05-09 ENCOUNTER — Emergency Department
Admission: EM | Admit: 2016-05-09 | Discharge: 2016-05-09 | Disposition: A | Discharge status: Home / Self Care | Payer: Managed Care, Other (non HMO) | Attending: Emergency Medicine | Admitting: Emergency Medicine

## 2016-05-09 DIAGNOSIS — E041 Nontoxic single thyroid nodule: Secondary | ICD-10-CM | POA: Diagnosis not present

## 2016-05-09 DIAGNOSIS — R0789 Other chest pain: Secondary | ICD-10-CM | POA: Insufficient documentation

## 2016-05-09 DIAGNOSIS — Z859 Personal history of malignant neoplasm, unspecified: Secondary | ICD-10-CM | POA: Diagnosis not present

## 2016-05-09 DIAGNOSIS — Z79899 Other long term (current) drug therapy: Secondary | ICD-10-CM | POA: Diagnosis not present

## 2016-05-09 DIAGNOSIS — I10 Essential (primary) hypertension: Secondary | ICD-10-CM | POA: Diagnosis not present

## 2016-05-09 HISTORY — DX: Malignant (primary) neoplasm, unspecified: C80.1

## 2016-05-09 LAB — BASIC METABOLIC PANEL
Anion gap: 8 (ref 5–15)
BUN: 26 mg/dL — ABNORMAL HIGH (ref 6–20)
CHLORIDE: 103 mmol/L (ref 101–111)
CO2: 26 mmol/L (ref 22–32)
Calcium: 9.4 mg/dL (ref 8.9–10.3)
Creatinine, Ser: 0.56 mg/dL (ref 0.44–1.00)
GFR calc non Af Amer: >60 mL/min (ref >60)
Glucose, Bld: 149 mg/dL — ABNORMAL HIGH (ref 65–99)
POTASSIUM: 4.2 mmol/L (ref 3.5–5.1)
SODIUM: 137 mmol/L (ref 135–145)

## 2016-05-09 LAB — CBC
HEMATOCRIT: 39.4 % (ref 35.0–47.0)
Hemoglobin: 13.5 g/dL (ref 12.0–16.0)
MCH: 30.3 pg (ref 26.0–34.0)
MCHC: 34.1 g/dL (ref 32.0–36.0)
MCV: 88.7 fL (ref 80.0–100.0)
Platelets: 310 10*3/uL (ref 150–440)
RBC: 4.45 MIL/uL (ref 3.80–5.20)
RDW: 13.6 % (ref 11.5–14.5)
WBC: 14.2 10*3/uL — AB (ref 3.6–11.0)

## 2016-05-09 LAB — TROPONIN I

## 2016-05-09 MED ORDER — IOPAMIDOL (ISOVUE-370) INJECTION 76%
75.0000 mL | Freq: Once | INTRAVENOUS | Status: AC | PRN
  Administered 2016-05-09: 75 mL via INTRAVENOUS
  Filled 2016-05-09: qty 75

## 2016-05-09 NOTE — ED Provider Notes (Signed)
[facility] Emergency Department Provider Note   ____________________________________________   First MD Initiated Contact with Patient 05/09/16 1703     (approximate)  I have reviewed the triage vital signs and the nursing notes.   HISTORY  Chief Complaint Chest Pain    HPI Terri Melendez is a 66 y.o. female reports me she is having pain over the left shoulders, hence been present for about a week and she is been on steroid Dosepak, pain medication, and seemed like he was getting better but then yesterday the pain worsen seemed to go from her left shoulder towards the left upper chest. Pain is better now. Not worsened by exertion, but is worsened by use of the left upper extremity. No numbness tingling or weakness.  She denies having "chest pain" but more describes that she would say is a feeling of tightness across the shoulder upper arm that radiates towards her left upper chest. Denies any personal history of heart disease. Symptoms have been present for about a week.  No nausea or vomiting. No rash. No trouble breathing    Past Medical History:  Diagnosis Date  . Cancer ([place])   . Hypertension   . Osteopenia   . Sinus congestion     Patient Active Problem List   Diagnosis Date Noted  . OSA on CPAP 10/13/2014    Past Surgical History:  Procedure Laterality Date  . BASAL CELL CARCINOMA EXCISION    . TONSILLECTOMY AND ADENOIDECTOMY      Prior to Admission medications   Medication Sig Start Date End Date Taking? Authorizing Provider  cyclobenzaprine (FLEXERIL) 10 MG tablet Take 1 tablet (10 mg total) by mouth 3 (three) times daily as needed for muscle spasms. 05/07/16   Versie Starks, PA-C  fluocinonide gel (LIDEX) 0.05 % apply to affected area [place]. 10/06/15   Historical Provider, MD  meloxicam (MOBIC) 15 MG tablet Take 1 tablet (15 mg total) by mouth daily. 05/07/16   Versie Starks, PA-C  methylPREDNISolone  (MEDROL DOSEPAK) 4 MG TBPK tablet Take 6 pills on day one then decrease by 1 pill each day 05/07/16   Versie Starks, PA-C  mometasone (NASONEX) 50 MCG/ACT nasal spray Place 2 sprays into the nose daily.    Historical Provider, MD  omeprazole (PRILOSEC) 40 MG capsule Take 40 mg by mouth daily.    Historical Provider, MD    Allergies Patient has no known allergies.  Family History  Problem Relation Age of Onset  . Cancer Mother     lung  . Breast cancer Maternal Aunt     Social History Social History  Substance Use Topics  . Smoking status: Never Smoker  . Smokeless tobacco: Never Used  . Alcohol use Yes     Comment: on occasion    Review of Systems Constitutional: No fever/chills Eyes: No visual changes. ENT: No sore throat. Cardiovascular: See history of present illness Respiratory: Denies shortness of breath. Gastrointestinal: No abdominal pain.  No nausea, no vomiting.  No diarrhea.  No constipation. Genitourinary: Negative for dysuria. Musculoskeletal: Negative for back pain. Skin: Negative for rash. Neurological: Negative for headaches, focal weakness or numbness.  10-point ROS otherwise negative.  ____________________________________________   PHYSICAL EXAM:  VITAL SIGNS: ED Triage Vitals [05/09/16 1341]  Enc Vitals Group     BP (!) 161/76     Pulse Rate (!) 120     Resp 20     Temp 98.9 F (  37.2 C)     Temp Source Oral     SpO2 100 %     Weight 139 lb (63 kg)     Height      Head Circumference      Peak Flow      Pain Score 5     Pain Loc      Pain Edu?      Excl. in [place]?     Constitutional: Alert and oriented. Well appearing and in no acute distress. Eyes: Conjunctivae are normal. PERRL. EOMI. Head: Atraumatic. Nose: No congestion/rhinnorhea. Mouth/Throat: Mucous membranes are moist.  Oropharynx non-erythematous. Neck: No stridor.  No cervical tenderness. No pain or tenderness reported over the carotids. Moves the neck well without pain or  difficulty. Patient does have tenderness across the left superior trapezius region, reports a somewhat sharp pain and discomfort over the trapezius with range of motion. Cardiovascular: Normal rate, regular rhythm. Grossly normal heart sounds.  Good peripheral circulation. Respiratory: Normal respiratory effort.  No retractions. Lungs CTAB. Gastrointestinal: Soft and nontender. No distention.  Musculoskeletal: No lower extremity tenderness nor edema.  No joint effusions. No edema or swelling in the upper arms bilaterally. Full range of motion with distal median ulnar and radial function preserved the upper extremities bilaterally, though she does report discomfort over the trapezius with range of motion on the left arm. No deformities. No rash. Neurologic:  Normal speech and language. No gross focal neurologic deficits are appreciated.  Skin:  Skin is warm, dry and intact. No rash noted. Psychiatric: Mood and affect are normal. Speech and behavior are normal.  ____________________________________________   LABS (all labs ordered are listed, but only abnormal results are displayed)  Labs Reviewed  BASIC METABOLIC PANEL - Abnormal; Notable for the following:       Result Value   Glucose, Bld 149 (*)    BUN 26 (*)    All other components within normal limits  CBC - Abnormal; Notable for the following:    WBC 14.2 (*)    All other components within normal limits  TROPONIN I  TROPONIN I   ____________________________________________  EKG  Reviewed and interpreted by me at 1335 Ventricular rate 120 PR 140 QTc 460 Sinus tachycardia, no evidence of ischemic change ____________________________________________  RADIOLOGY  Dg Chest 2 View  Result Date: 05/09/2016 CLINICAL DATA:  Mid chest pain and tightness today. EXAM: CHEST  2 VIEW COMPARISON:  None. FINDINGS: The lungs are clear. Heart size is normal. No pneumothorax or pleural effusion. No bony abnormality. IMPRESSION: Negative  chest. Electronically Signed   By: Inge Rise M.D.   On: 05/09/2016 14:22   Ct Angio Chest Pe W Or Wo Contrast  Result Date: 05/09/2016 CLINICAL DATA:  Chest and left shoulder pain EXAM: CT ANGIOGRAPHY CHEST WITH CONTRAST TECHNIQUE: Multidetector CT imaging of the chest was performed using the standard protocol during bolus administration of intravenous contrast. Multiplanar CT image reconstructions and MIPs were obtained to evaluate the vascular anatomy. CONTRAST:  75 mL Isovue 370 intravenous COMPARISON:  Chest x-ray 05/09/2016 FINDINGS: Cardiovascular: Satisfactory opacification of the pulmonary arteries to the segmental level. No evidence of pulmonary embolism. Non aneurysmal aorta. Mild atherosclerotic calcification. No dissection. Minimal coronary artery calcification. Normal heart size. No pericardial effusion. Mediastinum/Nodes: No enlarged mediastinal, hilar, or axillary lymph nodes. Trachea and esophagus demonstrate no significant findings. Subcentimeter hypodense nodule right lobe of thyroid gland. Lungs/Pleura: Lungs are clear. No pleural effusion or pneumothorax. Upper Abdomen: Heterogenous fatty infiltration of  the pancreas. No acute abnormality in the upper abdomen. Musculoskeletal: No chest wall abnormality. No acute or significant osseous findings. Degenerative changes of the spine. Review of the MIP images confirms the above findings. IMPRESSION: 1. Negative for acute pulmonary embolus or aortic dissection 2. 8 mm hypodense nodule right lobe of thyroid gland. 3. Clear lung fields Electronically Signed   By: Donavan Foil M.D.   On: 05/09/2016 18:25                                Repeat EKG performed at [facility] rate 90 QRS 80 QTc 450 No signs of ischemia or ectopy.  ____________                    ________________________________   PROCEDURES  Procedure(s) performed: None  Procedures  Critical Care performed:  No  ____________________________________________   INITIAL IMPRESSION / ASSESSMENT AND PLAN / ED COURSE  Pertinent labs & imaging results that were available during my care of the patient were reviewed by me and considered in my medical decision making (see chart for details).  Patient presents for evaluation of ongoing pain over the left upper back and shoulder. History seems most consistent with musculoskeletal, noticed after moving bookshelves and cleaning the floor, and somewhat reproducible by examination. No associated neurologic defects. Very atypical of coronary disease, has no obvious risk factors for acute coronary syndrome and her EKG and troponins are reassuring. Symptoms atypical of coronary syndrome.  ----------------------------------------- 7:06 PM on 05/09/2016 -----------------------------------------  Resting comfortably. Heart score, less than 3, low risk. Patient's symptoms very atypical of coronary disease. Appears most likely musculoskeletal.  Discharge patient, careful return precautions and follow-up recommendations discussed. She'll set up follow-up with cardiology, and also with her ear nose and throat doctor.        ____________________________________________   FINAL CLINICAL IMPRESSION(S) / ED DIAGNOSES  Final diagnoses:  Chest discomfort  Thyroid nodule      NEW MEDICATIONS STARTED DURING THIS VISIT:  New Prescriptions   No medications on file     Note:  This document was prepared using Dragon voice recognition software and may include unintentional dictation errors.     Delman Kitten, MD 05/09/16 [phone]

## 2016-05-09 NOTE — Discharge Instructions (Signed)
°  Please follow up with the recommended doctor as instructed above in these documents regarding today?s emergent visit and your recent symptoms to discuss further management.  Continue to take your regular medications. If you are not doing so already, please also take a daily baby aspirin (81 mg), at least until you follow up with your doctor.  Return to the Emergency Department (ED) if you experience any further chest pain/pressure/tightness, difficulty breathing, or sudden sweating, or other symptoms that concern you.  Please also set up follow-up with your ENT doctor, Dr. Tami Ribas for further follow-up on your thyroid nodule.

## 2016-05-09 NOTE — ED Triage Notes (Signed)
Pt with c/o chest pain and left shoulder pain started last week. Started on prednisone, flexeril and mobic for possible pulled muscle. Not sure if symptoms are related to new meds or something different.

## 2016-05-14 ENCOUNTER — Encounter: Payer: Self-pay | Admitting: Family Medicine

## 2016-05-14 ENCOUNTER — Ambulatory Visit (INDEPENDENT_AMBULATORY_CARE_PROVIDER_SITE_OTHER): Payer: Managed Care, Other (non HMO) | Admitting: Family Medicine

## 2016-05-14 VITALS — BP 147/86 | HR 70 | Ht 64.0 in | Wt 142.8 lb

## 2016-05-14 DIAGNOSIS — E7800 Pure hypercholesterolemia, unspecified: Secondary | ICD-10-CM

## 2016-05-14 DIAGNOSIS — Z09 Encounter for follow-up examination after completed treatment for conditions other than malignant neoplasm: Secondary | ICD-10-CM | POA: Diagnosis not present

## 2016-05-14 LAB — LP+ALT+AST PICCOLO, WAIVED
ALT (SGPT) Piccolo, Waived: 15 U/L (ref 10–47)
AST (SGOT) Piccolo, Waived: 20 U/L (ref 11–38)
CHOL/HDL RATIO PICCOLO,WAIVE: 3.5 mg/dL
Cholesterol Piccolo, Waived: 216 mg/dL — ABNORMAL HIGH (ref <200)
HDL Chol Piccolo, Waived: 62 mg/dL (ref >59)
LDL CHOL CALC PICCOLO WAIVED: 114 mg/dL — AB (ref <100)
Triglycerides Piccolo,Waived: 200 mg/dL — ABNORMAL HIGH (ref <150)
VLDL Chol Calc Piccolo,Waive: 40 mg/dL — ABNORMAL HIGH (ref <30)

## 2016-05-14 NOTE — Assessment & Plan Note (Signed)
On chart review patient showing history of elevated cholesterol. With coronary atherosclerosis on CT scan consider cholesterol medications. Patient's cholesterol repeated today showing LDL of 114. We will wait on further recommendation from cardiology before starting a statin.

## 2016-05-14 NOTE — Progress Notes (Signed)
BP (!) 147/86   Pulse 70   Ht 5\' 4"  (1.626 m)   Wt 142 lb 12.8 oz (64.8 kg)   SpO2 99%   BMI 24.51 kg/m    Subjective:    Patient ID: Terri Melendez, female    DOB: 03-Feb-1951, 66 y.o.   MRN: [id number]  HPI: Terri Melendez is a 66 y.o. female  Chief Complaint  Patient presents with  . Hospitalization Follow-up    Patient presents for evaluation of ongoing pain over the left upper back and shoulder. History seems most consistent with musculoskeletal, noticed after moving bookshelves and cleaning the floor, and somewhat reproducible by examination. No associated neurologic defects. Very atypical of coronary disease, has no obvious risk factors for acute coronary syndrome and her EKG and troponins are reassuring. Symptoms atypical of coronary syndrome.  . Shoulder Pain    Left pain, 2 weeks ago banged elbow very bad, could it be contributing?   Patient with complicated history see ER notes for more details including CT scan report. Patient with continued some left shoulder pain and nonspecific neck and back shoulder symptoms.  Relevant past medical, surgical, family and social history reviewed and updated as indicated. Interim medical history since our last visit reviewed. Allergies and medications reviewed and updated.  Review of Systems  Constitutional: Negative.   Respiratory: Negative.   Cardiovascular: Negative.     Per HPI unless specifically indicated above     Objective:    BP (!) 147/86   Pulse 70   Ht 5\' 4"  (1.626 m)   Wt 142 lb 12.8 oz (64.8 kg)   SpO2 99%   BMI 24.51 kg/m   Wt Readings from Last 3 Encounters:  05/14/16 142 lb 12.8 oz (64.8 kg)  05/09/16 139 lb (63 kg)  10/17/15 139 lb (63 kg)    Physical Exam  Constitutional: She is oriented to person, place, and time. She appears well-developed and well-nourished.  HENT:  Head: Normocephalic and atraumatic.  Eyes: Conjunctivae and EOM are normal.  Neck: Normal range of motion.    Cardiovascular: Normal rate, regular rhythm and normal heart sounds.   Pulmonary/Chest: Effort normal and breath sounds normal.  Musculoskeletal: Normal range of motion.  Left arm elbow full normal exam with normal strength no bony or muscular or tendinous tenderness. Neck shoulder clear.  Neurological: She is alert and oriented to person, place, and time.  Skin: No erythema.  Psychiatric: She has a normal mood and affect. Her behavior is normal. Judgment and thought content normal.    Results for orders placed or performed during the hospital encounter of 26/94/85  Basic metabolic panel  Result Value Ref Range   Sodium 137 135 - 145 mmol/L   Potassium 4.2 3.5 - 5.1 mmol/L   Chloride 103 101 - 111 mmol/L   CO2 26 22 - 32 mmol/L   Glucose, Bld 149 (H) 65 - 99 mg/dL   BUN 26 (H) 6 - 20 mg/dL   Creatinine, Ser 0.56 0.44 - 1.00 mg/dL   Calcium 9.4 8.9 - 10.3 mg/dL   GFR calc non Af Amer >60 >60 mL/min   GFR calc Af Amer >60 >60 mL/min   Anion gap 8 5 - 15  CBC  Result Value Ref Range   WBC 14.2 (H) 3.6 - 11.0 K/uL   RBC 4.45 3.80 - 5.20 MIL/uL   Hemoglobin 13.5 12.0 - 16.0 g/dL   HCT 39.4 35.0 - 47.0 %   MCV 88.7 80.0 -  100.0 fL   MCH 30.3 26.0 - 34.0 pg   MCHC 34.1 32.0 - 36.0 g/dL   RDW 13.6 11.5 - 14.5 %   Platelets 310 150 - 440 K/uL  Troponin I  Result Value Ref Range   Troponin I <0.03 <0.03 ng/mL  Troponin I  Result Value Ref Range   Troponin I <0.03 <0.03 ng/mL      Assessment & Plan:   Problem List Items Addressed This Visit      Other   Hypercholesteremia    On chart review patient showing history of elevated cholesterol. With coronary atherosclerosis on CT scan consider cholesterol medications. Patient's cholesterol repeated today showing LDL of 114. We will wait on further recommendation from cardiology before starting a statin.      Relevant Orders   LP+ALT+AST [place], Waived    Other Visit Diagnoses    Hospital discharge follow-up    -  Primary      For nonspecific chest symptoms patient has cardiology appointment later this week.  Follow up plan: Return for As scheduled.

## 2016-06-06 ENCOUNTER — Other Ambulatory Visit: Payer: Self-pay | Admitting: Unknown Physician Specialty

## 2016-06-06 DIAGNOSIS — E041 Nontoxic single thyroid nodule: Secondary | ICD-10-CM

## 2016-06-11 ENCOUNTER — Ambulatory Visit
Admission: RE | Admit: 2016-06-11 | Discharge: 2016-06-11 | Disposition: A | Discharge status: Home / Self Care | Payer: Managed Care, Other (non HMO) | Source: Ambulatory Visit | Attending: Unknown Physician Specialty | Admitting: Unknown Physician Specialty

## 2016-06-11 DIAGNOSIS — E041 Nontoxic single thyroid nodule: Secondary | ICD-10-CM | POA: Insufficient documentation

## 2016-06-12 ENCOUNTER — Other Ambulatory Visit: Payer: Self-pay | Admitting: Unknown Physician Specialty

## 2016-06-12 DIAGNOSIS — E041 Nontoxic single thyroid nodule: Secondary | ICD-10-CM

## 2016-10-05 ENCOUNTER — Telehealth: Payer: Self-pay | Admitting: Family Medicine

## 2016-10-05 DIAGNOSIS — Z131 Encounter for screening for diabetes mellitus: Secondary | ICD-10-CM

## 2016-10-05 DIAGNOSIS — I10 Essential (primary) hypertension: Secondary | ICD-10-CM

## 2016-10-05 DIAGNOSIS — E7800 Pure hypercholesterolemia, unspecified: Secondary | ICD-10-CM

## 2016-10-05 DIAGNOSIS — Z1329 Encounter for screening for other suspected endocrine disorder: Secondary | ICD-10-CM

## 2016-10-05 NOTE — Telephone Encounter (Signed)
Labs order, printed and faxed to Occup[facility] at [phone].

## 2016-10-05 NOTE — Telephone Encounter (Signed)
Pt would like to get [place] [facility] free of cost and she would like to have the orders sent to occupational health phone number is [phone].

## 2016-10-10 ENCOUNTER — Other Ambulatory Visit: Payer: Self-pay

## 2016-10-10 DIAGNOSIS — Z299 Encounter for prophylactic measures, unspecified: Secondary | ICD-10-CM

## 2016-10-10 NOTE — Progress Notes (Signed)
Patient came in to have blood drawn per Dr. Rance Muir orders.

## 2016-10-11 LAB — CMP12+LP+TP+TSH+6AC+CBC/D/PLT
ALBUMIN: 4.3 g/dL (ref 3.6–4.8)
ALK PHOS: 114 IU/L (ref 39–117)
ALT: 11 IU/L (ref 0–32)
AST: 15 IU/L (ref 0–40)
Albumin/Globulin Ratio: 2.2 (ref 1.2–2.2)
BILIRUBIN TOTAL: 0.3 mg/dL (ref 0.0–1.2)
BUN/Creatinine Ratio: 17 (ref 12–28)
BUN: 12 mg/dL (ref 8–27)
Basophils Absolute: 0 10*3/uL (ref 0.0–0.2)
Basos: 1 %
CHOLESTEROL TOTAL: 217 mg/dL — AB (ref 100–199)
Calcium: 9.1 mg/dL (ref 8.7–10.3)
Chloride: 103 mmol/L (ref 96–106)
Chol/HDL Ratio: 3.9 ratio (ref 0.0–4.4)
Creatinine, Ser: 0.69 mg/dL (ref 0.57–1.00)
EOS (ABSOLUTE): 0.2 10*3/uL (ref 0.0–0.4)
Eos: 4 %
Estimated CHD Risk: 0.8 times avg. (ref 0.0–1.0)
FREE THYROXINE INDEX: 1.9 (ref 1.2–4.9)
GFR calc Af Amer: 106 mL/min/{1.73_m2} (ref >59)
GFR calc non Af Amer: 92 mL/min/{1.73_m2} (ref >59)
GGT: 15 IU/L (ref 0–60)
Globulin, Total: 2 g/dL (ref 1.5–4.5)
Glucose: 80 mg/dL (ref 65–99)
HDL: 55 mg/dL (ref >39)
HEMOGLOBIN: 12.5 g/dL (ref 11.1–15.9)
Hematocrit: 38.1 % (ref 34.0–46.6)
IMMATURE GRANS (ABS): 0 10*3/uL (ref 0.0–0.1)
IMMATURE GRANULOCYTES: 0 %
Iron: 78 ug/dL (ref 27–139)
LDH: 178 IU/L (ref 119–226)
LDL CALC: 145 mg/dL — AB (ref 0–99)
LYMPHS: 26 %
Lymphocytes Absolute: 1.5 10*3/uL (ref 0.7–3.1)
MCH: 29 pg (ref 26.6–33.0)
MCHC: 32.8 g/dL (ref 31.5–35.7)
MCV: 88 fL (ref 79–97)
MONOS ABS: 0.5 10*3/uL (ref 0.1–0.9)
Monocytes: 8 %
NEUTROS PCT: 61 %
Neutrophils Absolute: 3.5 10*3/uL (ref 1.4–7.0)
Phosphorus: 3.6 mg/dL (ref 2.5–4.5)
Platelets: 255 10*3/uL (ref 150–379)
Potassium: 4.7 mmol/L (ref 3.5–5.2)
RBC: 4.31 x10E6/uL (ref 3.77–5.28)
RDW: 13.6 % (ref 12.3–15.4)
Sodium: 140 mmol/L (ref 134–144)
T3 Uptake Ratio: 27 % (ref 24–39)
T4, Total: 7 ug/dL (ref 4.5–12.0)
TRIGLYCERIDES: 87 mg/dL (ref 0–149)
TSH: 1.08 u[IU]/mL (ref 0.450–4.500)
Total Protein: 6.3 g/dL (ref 6.0–8.5)
Uric Acid: 4.1 mg/dL (ref 2.5–7.1)
VLDL CHOLESTEROL CAL: 17 mg/dL (ref 5–40)
WBC: 5.7 10*3/uL (ref 3.4–10.8)

## 2016-10-15 ENCOUNTER — Encounter: Payer: Self-pay | Admitting: Family Medicine

## 2016-10-17 ENCOUNTER — Encounter: Payer: Self-pay | Admitting: Family Medicine

## 2016-10-17 ENCOUNTER — Ambulatory Visit (INDEPENDENT_AMBULATORY_CARE_PROVIDER_SITE_OTHER): Payer: Managed Care, Other (non HMO) | Admitting: Family Medicine

## 2016-10-17 VITALS — BP 155/83 | HR 72 | Ht 64.96 in | Wt 139.0 lb

## 2016-10-17 DIAGNOSIS — G4733 Obstructive sleep apnea (adult) (pediatric): Secondary | ICD-10-CM | POA: Diagnosis not present

## 2016-10-17 DIAGNOSIS — Z124 Encounter for screening for malignant neoplasm of cervix: Secondary | ICD-10-CM | POA: Diagnosis not present

## 2016-10-17 DIAGNOSIS — Z0000 Encounter for general adult medical examination without abnormal findings: Secondary | ICD-10-CM | POA: Diagnosis not present

## 2016-10-17 DIAGNOSIS — Z131 Encounter for screening for diabetes mellitus: Secondary | ICD-10-CM | POA: Diagnosis not present

## 2016-10-17 DIAGNOSIS — Z1329 Encounter for screening for other suspected endocrine disorder: Secondary | ICD-10-CM

## 2016-10-17 DIAGNOSIS — Z9989 Dependence on other enabling machines and devices: Secondary | ICD-10-CM | POA: Diagnosis not present

## 2016-10-17 DIAGNOSIS — E7800 Pure hypercholesterolemia, unspecified: Secondary | ICD-10-CM | POA: Diagnosis not present

## 2016-10-17 DIAGNOSIS — Z13220 Encounter for screening for lipoid disorders: Secondary | ICD-10-CM

## 2016-10-17 LAB — URINALYSIS, ROUTINE W REFLEX MICROSCOPIC
Bilirubin, UA: NEGATIVE
GLUCOSE, UA: NEGATIVE
KETONES UA: NEGATIVE
LEUKOCYTES UA: NEGATIVE
NITRITE UA: NEGATIVE
Protein, UA: NEGATIVE
Urobilinogen, Ur: 0.2 mg/dL (ref 0.2–1.0)
pH, UA: 5.5 (ref 5.0–7.5)

## 2016-10-17 LAB — MICROSCOPIC EXAMINATION
Bacteria, UA: NONE SEEN
RBC, UA: NONE SEEN /hpf (ref 0–?)

## 2016-10-17 NOTE — Addendum Note (Signed)
Addended by: Gerda Diss A on: 10/17/2016 04:18 PM   Modules accepted: Orders

## 2016-10-17 NOTE — Assessment & Plan Note (Addendum)
The current medical regimen is effective;  continue present plan and medications. Uses faithfully and without problems

## 2016-10-17 NOTE — Assessment & Plan Note (Signed)
Diet no sx or problems

## 2016-10-17 NOTE — Progress Notes (Signed)
BP (!) 155/83   Pulse 72   Ht 5' 4.96" (1.65 m)   Wt 139 lb (63 kg)   SpO2 97%   BMI 23.16 kg/m    Subjective:    Patient ID: Terri Melendez, female    DOB: [birth year], 66 y.o.   MRN: [id number]  HPI: Terri Melendez is a 66 y.o. female  Chief Complaint  Patient presents with  . Annual Exam  Patient follow-up had cardiac workup which was all normal from cardiology with through physical therapy for shoulder and neck and symptoms have resolved and doing well. Uses CPAP without problems and faithfully. Takes allergy medication and reflux medication otherwise stable.   Relevant past medical, surgical, family and social history reviewed and updated as indicated. Interim medical history since our last visit reviewed. Allergies and medications reviewed and updated.  Review of Systems  Constitutional: Negative.   HENT: Negative.   Eyes: Negative.   Respiratory: Negative.   Cardiovascular: Negative.   Gastrointestinal: Negative.   Endocrine: Negative.   Genitourinary: Negative.   Musculoskeletal: Negative.   Skin: Negative.   Allergic/Immunologic: Negative.   Neurological: Negative.   Hematological: Negative.   Psychiatric/Behavioral: Negative.     Per HPI unless specifically indicated above     Objective:    BP (!) 155/83   Pulse 72   Ht 5' 4.96" (1.65 m)   Wt 139 lb (63 kg)   SpO2 97%   BMI 23.16 kg/m   Wt Readings from Last 3 Encounters:  10/17/16 139 lb (63 kg)  05/14/16 142 lb 12.8 oz (64.8 kg)  05/09/16 139 lb (63 kg)    Physical Exam  Constitutional: She is oriented to person, place, and time. She appears well-developed and well-nourished.  HENT:  Head: Normocephalic and atraumatic.  Right Ear: External ear normal.  Left Ear: External ear normal.  Nose: Nose normal.  Mouth/Throat: Oropharynx is clear and moist.  Eyes: Pupils are equal, round, and reactive to light. Conjunctivae and EOM are normal.  Neck: Normal range of motion. Neck  supple. Carotid bruit is not present.  Cardiovascular: Normal rate, regular rhythm and normal heart sounds.   No murmur heard. Pulmonary/Chest: Effort normal and breath sounds normal. She exhibits no mass. Right breast exhibits no mass, no skin change and no tenderness. Left breast exhibits no mass, no skin change and no tenderness. Breasts are symmetrical.  Abdominal: Soft. Bowel sounds are normal. There is no hepatosplenomegaly.  Genitourinary: Vagina normal and uterus normal. No vaginal discharge found.  Musculoskeletal: Normal range of motion.  Neurological: She is alert and oriented to person, place, and time.  Skin: No rash noted.  Psychiatric: She has a normal mood and affect. Her behavior is normal. Judgment and thought content normal.    Results for orders placed or performed in visit on 10/10/16  Executive Panel  Result Value Ref Range   Glucose 80 65 - 99 mg/dL   Uric Acid 4.1 2.5 - 7.1 mg/dL   BUN 12 8 - 27 mg/dL   Creatinine, Ser 0.69 0.57 - 1.00 mg/dL   GFR calc non Af Amer 92 >59 mL/min/1.73   GFR calc Af Amer 106 >59 mL/min/1.73   BUN/Creatinine Ratio 17 12 - 28   Sodium 140 134 - 144 mmol/L   Potassium 4.7 3.5 - 5.2 mmol/L   Chloride 103 96 - 106 mmol/L   Calcium 9.1 8.7 - 10.3 mg/dL   Phosphorus 3.6 2.5 - 4.5 mg/dL   Total Protein  6.3 6.0 - 8.5 g/dL   Albumin 4.3 3.6 - 4.8 g/dL   Globulin, Total 2.0 1.5 - 4.5 g/dL   Albumin/Globulin Ratio 2.2 1.2 - 2.2   Bilirubin Total 0.3 0.0 - 1.2 mg/dL   Alkaline Phosphatase 114 39 - 117 IU/L   LDH 178 119 - 226 IU/L   AST 15 0 - 40 IU/L   ALT 11 0 - 32 IU/L   GGT 15 0 - 60 IU/L   Iron 78 27 - 139 ug/dL   Cholesterol, Total 217 (H) 100 - 199 mg/dL   Triglycerides 87 0 - 149 mg/dL   HDL 55 >39 mg/dL   VLDL Cholesterol Cal 17 5 - 40 mg/dL   LDL Calculated 145 (H) 0 - 99 mg/dL   Chol/HDL Ratio 3.9 0.0 - 4.4 ratio   Estimated CHD Risk 0.8 0.0 - 1.0 times avg.   TSH 1.080 0.450 - 4.500 uIU/mL   T4, Total 7.0 4.5 - 12.0  ug/dL   T3 Uptake Ratio 27 24 - 39 %   Free Thyroxine Index 1.9 1.2 - 4.9   WBC 5.7 3.4 - 10.8 x10E3/uL   RBC 4.31 3.77 - 5.28 x10E6/uL   Hemoglobin 12.5 11.1 - 15.9 g/dL   Hematocrit 38.1 34.0 - 46.6 %   MCV 88 79 - 97 fL   MCH 29.0 26.6 - 33.0 pg   MCHC 32.8 31.5 - 35.7 g/dL   RDW 13.6 12.3 - 15.4 %   Platelets 255 150 - 379 x10E3/uL   Neutrophils 61 Not Estab. %   Lymphs 26 Not Estab. %   Monocytes 8 Not Estab. %   Eos 4 Not Estab. %   Basos 1 Not Estab. %   Neutrophils Absolute 3.5 1.4 - 7.0 x10E3/uL   Lymphocytes Absolute 1.5 0.7 - 3.1 x10E3/uL   Monocytes Absolute 0.5 0.1 - 0.9 x10E3/uL   EOS (ABSOLUTE) 0.2 0.0 - 0.4 x10E3/uL   Basophils Absolute 0.0 0.0 - 0.2 x10E3/uL   Immature Granulocytes 0 Not Estab. %   Immature Grans (Abs) 0.0 0.0 - 0.1 x10E3/uL      Assessment & Plan:   Problem List Items Addressed This Visit      Respiratory   OSA on CPAP    The current medical regimen is effective;  continue present plan and medications. Uses faithfully and without problems         Other   Hypercholesteremia - Primary    Diet no sx or problems        Other Visit Diagnoses    Annual physical exam       Relevant Orders   Urinalysis, Routine w reflex microscopic   Screening cholesterol level       Thyroid disorder screen       Screening for diabetes mellitus (DM)       Relevant Orders   Urinalysis, Routine w reflex microscopic       Follow up plan: Return for Physical Exam.

## 2016-10-19 LAB — IGP, APTIMA HPV, RFX 16/18,45
HPV Aptima: NEGATIVE
PAP SMEAR COMMENT: 0

## 2016-10-21 ENCOUNTER — Encounter: Payer: Self-pay | Admitting: Family Medicine

## 2016-11-04 IMAGING — MG MM DIGITAL SCREENING BILAT W/ CAD
5 series · 5 of 5 positions shown · non-contrast
Comparison: Previous exam(s).

CLINICAL DATA: Screening.

EXAM:
DIGITAL SCREENING BILATERAL MAMMOGRAM WITH CAD

[L MLO (1 of 2)]
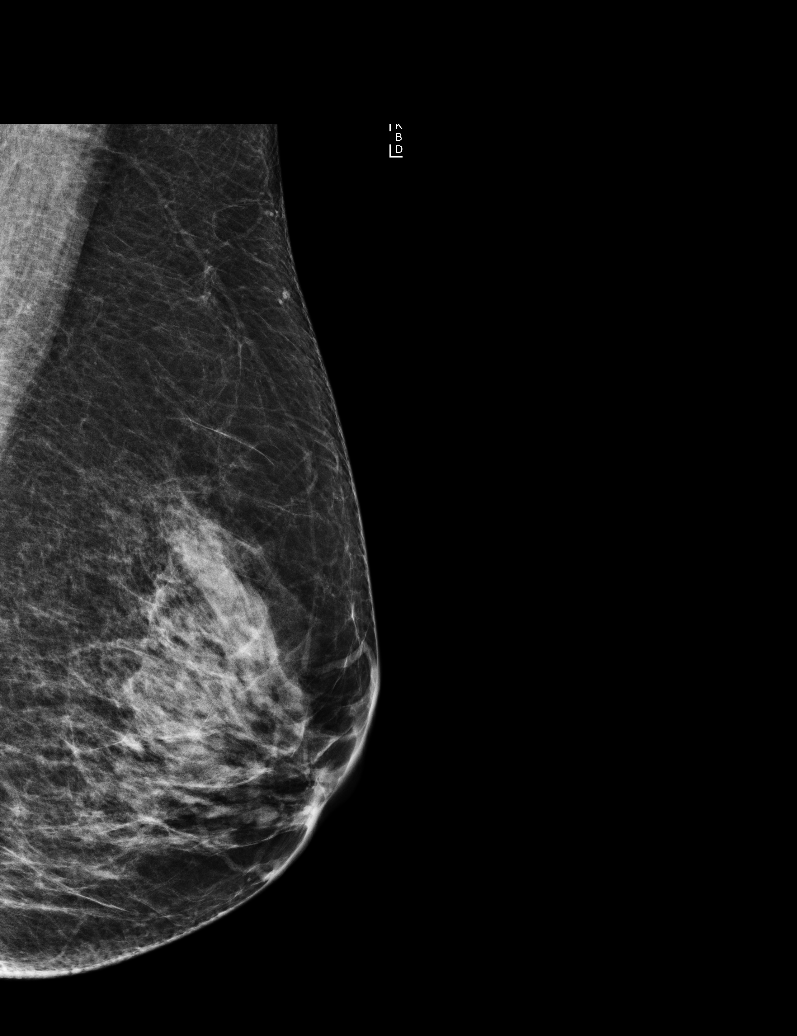

[R MLO]
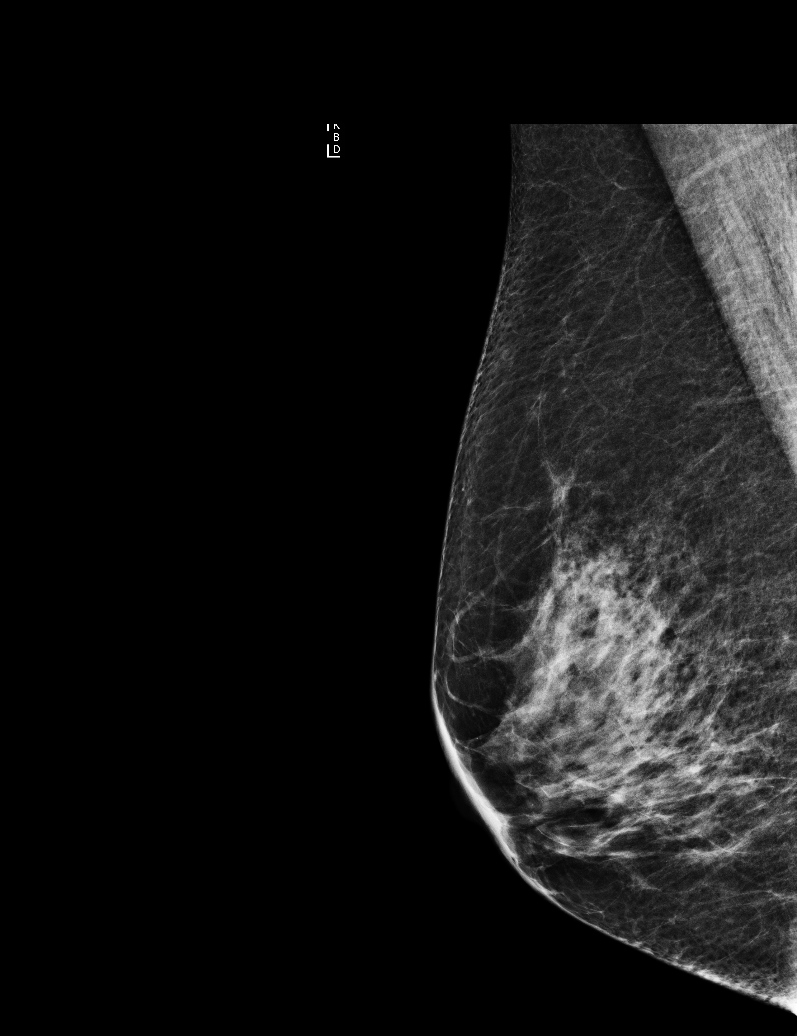

[L CC]
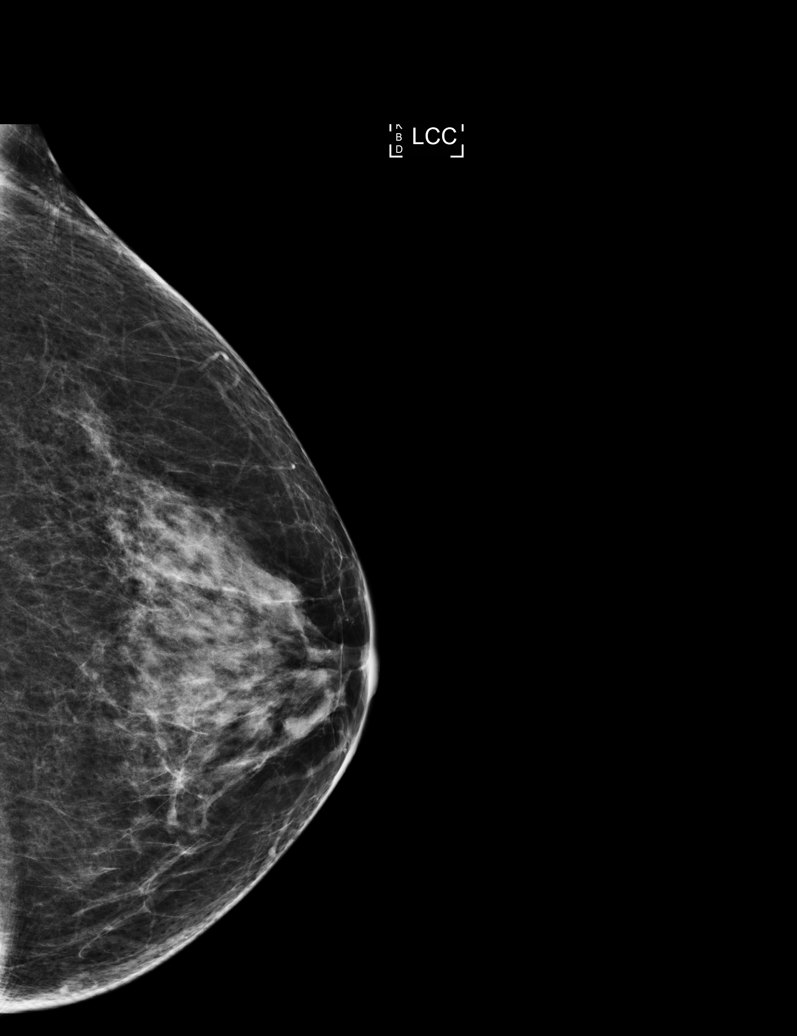

[L MLO (2 of 2)]
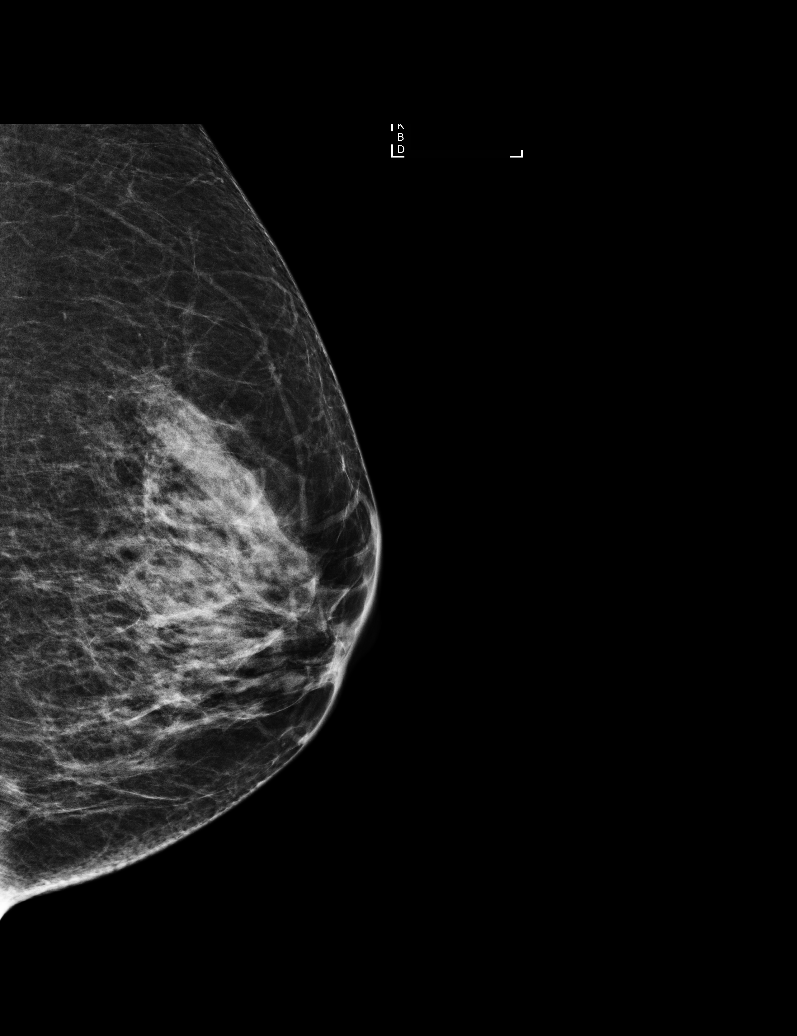

[R CC]
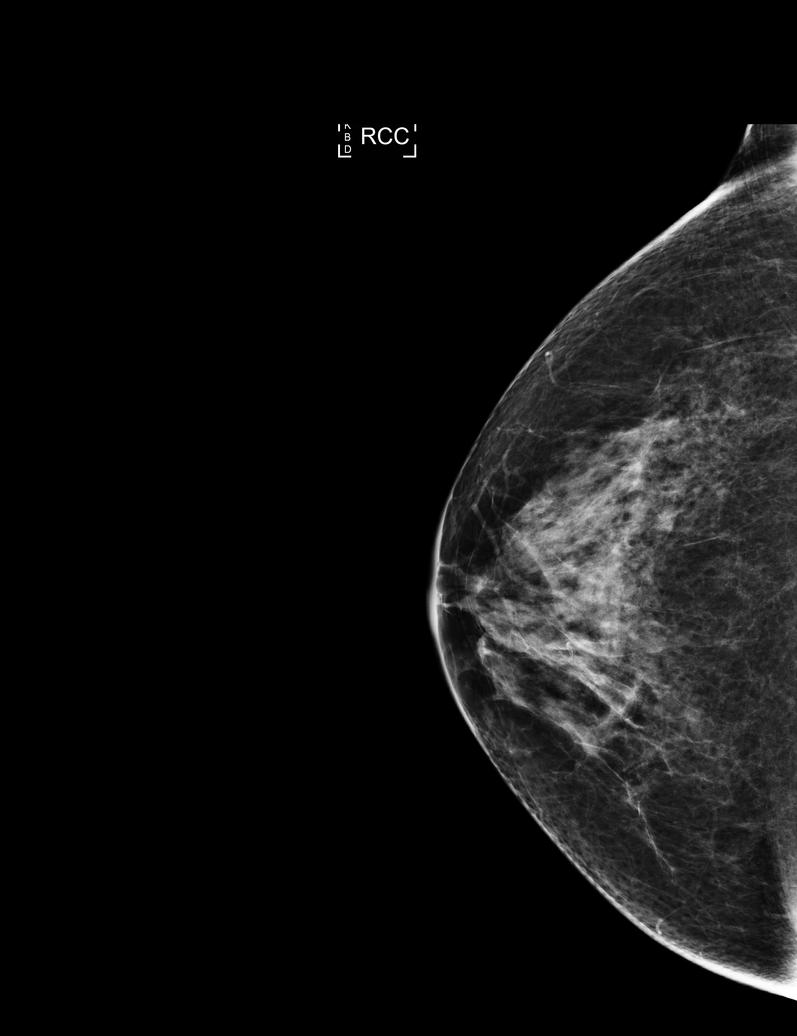

[5 of 5 positions shown; findings below may reference images not displayed]

ACR Breast Density Category c: The breast tissue is heterogeneously
dense, which may obscure small masses.
FINDINGS: There are no findings suspicious for malignancy. Images were
processed with CAD.
IMPRESSION: No mammographic evidence of malignancy. A result letter of this
screening mammogram will be mailed directly to the patient.

RECOMMENDATION:
Screening mammogram in one year. (Code:YJ-2-FEZ)

BI-RADS CATEGORY  1: Negative.

## 2016-12-31 ENCOUNTER — Encounter: Payer: Self-pay | Admitting: Family Medicine

## 2017-01-04 ENCOUNTER — Other Ambulatory Visit: Payer: Self-pay | Admitting: Family Medicine

## 2017-01-04 DIAGNOSIS — Z1231 Encounter for screening mammogram for malignant neoplasm of breast: Secondary | ICD-10-CM

## 2017-01-30 ENCOUNTER — Ambulatory Visit
Admission: RE | Admit: 2017-01-30 | Discharge: 2017-01-30 | Disposition: A | Discharge status: Home / Self Care | Payer: Managed Care, Other (non HMO) | Source: Ambulatory Visit | Attending: Family Medicine | Admitting: Family Medicine

## 2017-01-30 DIAGNOSIS — Z1231 Encounter for screening mammogram for malignant neoplasm of breast: Secondary | ICD-10-CM | POA: Insufficient documentation

## 2017-02-25 ENCOUNTER — Ambulatory Visit: Payer: Managed Care, Other (non HMO) | Admitting: Family Medicine

## 2017-02-25 ENCOUNTER — Encounter: Payer: Self-pay | Admitting: Family Medicine

## 2017-02-25 VITALS — BP 152/70 | HR 81 | Temp 98.2°F | Wt 143.2 lb

## 2017-02-25 DIAGNOSIS — J0100 Acute maxillary sinusitis, unspecified: Secondary | ICD-10-CM | POA: Diagnosis not present

## 2017-02-25 MED ORDER — AMOXICILLIN-POT CLAVULANATE 875-125 MG PO TABS: 1.0000 | ORAL_TABLET | Freq: Two times a day (BID) | ORAL | 0 refills | Status: DC

## 2017-02-25 MED ORDER — GUAIFENESIN ER 600 MG PO TB12: 600.0000 mg | ORAL_TABLET | Freq: Two times a day (BID) | ORAL | 0 refills | Status: DC | PRN

## 2017-02-25 NOTE — Progress Notes (Signed)
BP (!) 152/70 (BP Location: Right Arm, Patient Position: Sitting, Cuff Size: Normal)   Pulse 81   Temp 98.2 F (36.8 C) (Oral)   Wt 143 lb 3.2 oz (65 kg)   SpO2 100%   BMI 23.86 kg/m    Subjective:    Patient ID: Terri Melendez, female    DOB: March 11, 1950, 66 y.o.   MRN: [id number]  HPI: Terri Melendez is a 66 y.o. female  Chief Complaint  Patient presents with  . Sinusitis    x's 5 days. Been taking dyquil/nyquil, not much relief. Very tight, unable to successfully blow nose.  . Nasal Congestion  . Cough   Congestion, sinus pain and pressure, sore throat, cough, fatigue x 5+ days. Denis fever, chills, body aches, CP. Taking dayquil and nyquil with temporary relief and dose the nasonex spray regularly. Husband sick with similar sxs. Does have a hx of sinus issues.   Past Medical History:  Diagnosis Date  . Cancer ([place])   . Hypertension   . Osteopenia   . Sinus congestion    Social History   Socioeconomic History  . Marital status: Married    Spouse name: Not on file  . Number of children: Not on file  . Years of education: Not on file  . Highest education level: Not on file  Social Needs  . Financial resource strain: Not on file  . Food insecurity - worry: Not on file  . Food insecurity - inability: Not on file  . Transportation needs - medical: Not on file  . Transportation needs - non-medical: Not on file  Occupational History  . Not on file  Tobacco Use  . Smoking status: Never Smoker  . Smokeless tobacco: Never Used  Substance and Sexual Activity  . Alcohol use: Yes    Comment: on occasion  . Drug use: No  . Sexual activity: Not on file  Other Topics Concern  . Not on file  Social History Narrative  . Not on file    Relevant past medical, surgical, family and social history reviewed and updated as indicated. Interim medical history since our last visit reviewed. Allergies and medications reviewed and updated.  Review of Systems    Constitutional: Positive for fatigue.  HENT: Positive for congestion, sinus pressure, sinus pain and sore throat.   Eyes: Negative.   Respiratory: Positive for cough.   Cardiovascular: Negative.   Gastrointestinal: Negative.   Genitourinary: Negative.   Musculoskeletal: Negative.   Skin: Negative.   Neurological: Negative.   Psychiatric/Behavioral: Negative.     Per HPI unless specifically indicated above     Objective:    BP (!) 152/70 (BP Location: Right Arm, Patient Position: Sitting, Cuff Size: Normal)   Pulse 81   Temp 98.2 F (36.8 C) (Oral)   Wt 143 lb 3.2 oz (65 kg)   SpO2 100%   BMI 23.86 kg/m   Wt Readings from Last 3 Encounters:  02/25/17 143 lb 3.2 oz (65 kg)  10/17/16 139 lb (63 kg)  05/14/16 142 lb 12.8 oz (64.8 kg)    Physical Exam  Results for orders placed or performed in visit on 10/17/16  Microscopic Examination  Result Value Ref Range   WBC, UA 0-5 0 - 5 /hpf   RBC, UA None seen 0 - 2 /hpf   Epithelial Cells (non renal) 0-10 0 - 10 /hpf   Bacteria, UA None seen None seen/Few  Urinalysis, Routine w reflex microscopic  Result Value Ref  Range   Specific Gravity, UA <1.005 (L) 1.005 - 1.030   pH, UA 5.5 5.0 - 7.5   Color, UA Yellow Yellow   Appearance Ur Cloudy (A) Clear   Leukocytes, UA Negative Negative   Protein, UA Negative Negative/Trace   Glucose, UA Negative Negative   Ketones, UA Negative Negative   RBC, UA 2+ (A) Negative   Bilirubin, UA Negative Negative   Urobilinogen, Ur 0.2 0.2 - 1.0 mg/dL   Nitrite, UA Negative Negative   Microscopic Examination See below:   IGP, Aptima HPV, rfx 16/18,45  Result Value Ref Range   DIAGNOSIS: Comment    Specimen adequacy: Comment    Clinician Provided ICD10 Comment    Performed by: Comment    PAP Smear Comment .    Note: Comment    Test Methodology Comment    HPV Aptima Negative Negative      Assessment & Plan:   Problem List Items Addressed This Visit    None    Visit Diagnoses     Acute maxillary sinusitis, recurrence not specified    -  Primary   Will treat with augmentin, plain mucinex, sinus rinses, nasonex spray. Discussed supportive care. F/u if no improvement   Relevant Medications   amoxicillin-clavulanate (AUGMENTIN) 875-125 MG tablet   guaiFENesin (MUCINEX) 600 MG 12 hr tablet       Follow up plan: Return if symptoms worsen or fail to improve.

## 2017-02-28 NOTE — Patient Instructions (Signed)
Follow up as needed

## 2017-03-15 ENCOUNTER — Ambulatory Visit: Payer: Self-pay | Admitting: Medical

## 2017-03-15 VITALS — BP 135/65 | HR 99 | Temp 99.0°F | Resp 16

## 2017-03-15 DIAGNOSIS — R0982 Postnasal drip: Secondary | ICD-10-CM

## 2017-03-15 NOTE — Progress Notes (Signed)
   Subjective:    Patient ID: Terri Melendez, female    DOB: 01/19/1951, 67 y.o.   MRN: [id number]  HPI 67 yo female in non acute distress Seen on the  31st of December by Dr. Tami Ribas treated with Amoxil x 10 days and Mucinex. Did seem to get better. Then las week started with a "tickle in my throat", and  coughing last night while laying down.Denies fever or chills , denies shortness of breath, or chest pain.  Mild nasal congestion at times.  Review of Systems  Constitutional: Negative for chills and fever.  HENT: Positive for congestion (mild) and postnasal drip. Negative for ear pain, nosebleeds, rhinorrhea, sinus pressure, sinus pain and sore throat.   Respiratory: Positive for cough. Negative for shortness of breath.   Cardiovascular: Negative for chest pain.  Gastrointestinal: Negative for abdominal pain.  Genitourinary: Negative for dysuria.  Skin: Negative for rash.  Neurological: Negative for dizziness, syncope, light-headedness and headaches.  Psychiatric/Behavioral: Negative for behavioral problems, self-injury and suicidal ideas. The patient is not nervous/anxious.        Objective:   Physical Exam  Constitutional: She is oriented to person, place, and time. She appears well-developed and well-nourished.  HENT:  Head: Normocephalic and atraumatic.  Right Ear: External ear normal.  Left Ear: External ear normal.  Nose: Nose normal.  Eyes: Conjunctivae and EOM are normal. Pupils are equal, round, and reactive to light.  Neck: Normal range of motion. Neck supple.  Cardiovascular: Normal rate, regular rhythm and normal heart sounds.  Pulmonary/Chest: Effort normal and breath sounds normal.  Lymphadenopathy:    She has no cervical adenopathy.  Neurological: She is alert and oriented to person, place, and time.  Skin: Skin is warm and dry.  Psychiatric: She has a normal mood and affect. Her behavior is normal. Judgment and thought content normal.     Vascular  looking uvula no erythema or swelling noted. Clearing throat in room a lot.    Assessment & Plan:  PND/ sore throat most likely from PND OTC Zyrtec or Claritin , take as directed.  Stop Nasonex x 7 days to allow right medial nare to heal. Avoid picking nose. No need to do nettie pot. After one week can restart the Nasonex. Follow up with Dr. Tami Ribas or return  here if PND does not improve.  Patient verbalizes understanding and has no questions at discharge.

## 2017-05-07 ENCOUNTER — Ambulatory Visit: Payer: Managed Care, Other (non HMO) | Admitting: Family Medicine

## 2017-05-08 ENCOUNTER — Encounter: Payer: Self-pay | Admitting: Family Medicine

## 2017-05-08 ENCOUNTER — Ambulatory Visit: Payer: Managed Care, Other (non HMO) | Admitting: Family Medicine

## 2017-05-08 VITALS — BP 142/75 | HR 82 | Temp 98.2°F | Wt 141.8 lb

## 2017-05-08 DIAGNOSIS — L989 Disorder of the skin and subcutaneous tissue, unspecified: Secondary | ICD-10-CM

## 2017-05-08 DIAGNOSIS — H6592 Unspecified nonsuppurative otitis media, left ear: Secondary | ICD-10-CM | POA: Diagnosis not present

## 2017-05-08 DIAGNOSIS — H60542 Acute eczematoid otitis externa, left ear: Secondary | ICD-10-CM | POA: Diagnosis not present

## 2017-05-08 NOTE — Patient Instructions (Signed)
Epsom salt soaks and pumice stone to exfoliate the thickened areas Salicylic acid solution (usually found in the wart remover section) as directed on bottle. Can also get corn or wart pads to cover the area and further treat

## 2017-05-08 NOTE — Progress Notes (Signed)
BP (!) 142/75   Pulse 82   Temp 98.2 F (36.8 C) (Oral)   Wt 141 lb 12.8 oz (64.3 kg)   SpO2 95%   BMI 23.63 kg/m    Subjective:    Patient ID: Terri Melendez, female    DOB: 1950/11/24, 67 y.o.   MRN: [id number]  HPI: Terri Melendez is a 67 y.o. female  Chief Complaint  Patient presents with  . Ear Itching    pt states she has been having left ear itching for the past couple of weeks  . Foot Problem    pt states she has a place on her foot that she would like looked at. States it has been there for a couple of weeks as well   Left ear itching x several weeks. Denies pain, muffled hearing, discharge, fevers, recent rhinorrhea or other allergy sxs. Only concerned as she's flying across the country in a few days and does not want to have ear issues during this. Has some of her husband's steroid ear drops but has not tried them yet.    Noticed a place on her left foot for about a month, does not bother her unless it's bumped into something, then it's tender. No known injury, has not tried anything OTC for it.   Relevant past medical, surgical, family and social history reviewed and updated as indicated. Interim medical history since our last visit reviewed. Allergies and medications reviewed and updated.  Review of Systems  Per HPI unless specifically indicated above     Objective:    BP (!) 142/75   Pulse 82   Temp 98.2 F (36.8 C) (Oral)   Wt 141 lb 12.8 oz (64.3 kg)   SpO2 95%   BMI 23.63 kg/m   Wt Readings from Last 3 Encounters:  05/08/17 141 lb 12.8 oz (64.3 kg)  02/25/17 143 lb 3.2 oz (65 kg)  10/17/16 139 lb (63 kg)    Physical Exam  Constitutional: She is oriented to person, place, and time. She appears well-developed and well-nourished. No distress.  HENT:  Head: Atraumatic.  Nose: Nose normal.  Mouth/Throat: Oropharynx is clear and moist. No oropharyngeal exudate.  Very mild middle ear effusion left ear Mildly erythematous and flaking  left EAC  Eyes: Conjunctivae are normal. Pupils are equal, round, and reactive to light. No scleral icterus.  Neck: Normal range of motion. Neck supple.  Cardiovascular: Normal rate and normal heart sounds.  Pulmonary/Chest: Effort normal and breath sounds normal.  Musculoskeletal: Normal range of motion.  Neurological: She is alert and oriented to person, place, and time.  Skin: Skin is warm and dry.  Calloused area with central indention forming on posterior left foot  Psychiatric: She has a normal mood and affect. Her behavior is normal.  Nursing note and vitals reviewed.   Results for orders placed or performed in visit on 10/17/16  Microscopic Examination  Result Value Ref Range   WBC, UA 0-5 0 - 5 /hpf   RBC, UA None seen 0 - 2 /hpf   Epithelial Cells (non renal) 0-10 0 - 10 /hpf   Bacteria, UA None seen None seen/Few  Urinalysis, Routine w reflex microscopic  Result Value Ref Range   Specific Gravity, UA <1.005 (L) 1.005 - 1.030   pH, UA 5.5 5.0 - 7.5   Color, UA Yellow Yellow   Appearance Ur Cloudy (A) Clear   Leukocytes, UA Negative Negative   Protein, UA Negative Negative/Trace   Glucose,  UA Negative Negative   Ketones, UA Negative Negative   RBC, UA 2+ (A) Negative   Bilirubin, UA Negative Negative   Urobilinogen, Ur 0.2 0.2 - 1.0 mg/dL   Nitrite, UA Negative Negative   Microscopic Examination See below:   IGP, Aptima HPV, rfx 16/18,45  Result Value Ref Range   DIAGNOSIS: Comment    Specimen adequacy: Comment    Clinician Provided ICD10 Comment    Performed by: Comment    PAP Smear Comment .    Note: Comment    Test Methodology Comment    HPV Aptima Negative Negative      Assessment & Plan:   Problem List Items Addressed This Visit    None    Visit Diagnoses    Eczema of left external ear    -  Primary   States steroid drops are very expensive so will try the ones she already has at home. Recommended some moisturizing oil for canals additionally    Otitis media with effusion, left       Very mild but given upcoming flight recommended a few days of sudafed and regular use of her steroid nasal spray twice daily to keep inner ear pressure down   Skin lesion of foot       Appears to be an early plantar wart on posterior foot, soaks, exfoliation, salycylic acid, wart pads to reduce friction to the area.        Follow up plan: Return if symptoms worsen or fail to improve.

## 2017-05-10 ENCOUNTER — Ambulatory Visit: Payer: Self-pay

## 2017-06-12 ENCOUNTER — Ambulatory Visit
Admission: RE | Admit: 2017-06-12 | Discharge: 2017-06-12 | Disposition: A | Discharge status: Home / Self Care | Payer: Managed Care, Other (non HMO) | Source: Ambulatory Visit | Attending: Unknown Physician Specialty | Admitting: Unknown Physician Specialty

## 2017-06-12 DIAGNOSIS — E041 Nontoxic single thyroid nodule: Secondary | ICD-10-CM | POA: Diagnosis not present

## 2017-10-17 DIAGNOSIS — G4733 Obstructive sleep apnea (adult) (pediatric): Secondary | ICD-10-CM | POA: Diagnosis not present

## 2017-10-22 ENCOUNTER — Encounter: Payer: Self-pay | Admitting: Family Medicine

## 2017-10-22 ENCOUNTER — Ambulatory Visit (INDEPENDENT_AMBULATORY_CARE_PROVIDER_SITE_OTHER): Payer: Medicare HMO | Admitting: Family Medicine

## 2017-10-22 VITALS — BP 145/78 | HR 73 | Ht 64.17 in | Wt 138.0 lb

## 2017-10-22 DIAGNOSIS — Z9989 Dependence on other enabling machines and devices: Secondary | ICD-10-CM | POA: Diagnosis not present

## 2017-10-22 DIAGNOSIS — E7800 Pure hypercholesterolemia, unspecified: Secondary | ICD-10-CM | POA: Diagnosis not present

## 2017-10-22 DIAGNOSIS — Z7189 Other specified counseling: Secondary | ICD-10-CM | POA: Insufficient documentation

## 2017-10-22 DIAGNOSIS — Z1329 Encounter for screening for other suspected endocrine disorder: Secondary | ICD-10-CM | POA: Diagnosis not present

## 2017-10-22 DIAGNOSIS — I10 Essential (primary) hypertension: Secondary | ICD-10-CM

## 2017-10-22 DIAGNOSIS — M545 Low back pain, unspecified: Secondary | ICD-10-CM

## 2017-10-22 DIAGNOSIS — G4733 Obstructive sleep apnea (adult) (pediatric): Secondary | ICD-10-CM | POA: Diagnosis not present

## 2017-10-22 LAB — URINALYSIS, ROUTINE W REFLEX MICROSCOPIC
Bilirubin, UA: NEGATIVE
GLUCOSE, UA: NEGATIVE
Ketones, UA: NEGATIVE
Leukocytes, UA: NEGATIVE
Nitrite, UA: NEGATIVE
Protein, UA: NEGATIVE
Specific Gravity, UA: 1.01 (ref 1.005–1.030)
Urobilinogen, Ur: 0.2 mg/dL (ref 0.2–1.0)
pH, UA: 7.5 (ref 5.0–7.5)

## 2017-10-22 LAB — MICROSCOPIC EXAMINATION
BACTERIA UA: NONE SEEN
WBC, UA: NONE SEEN /hpf (ref 0–5)

## 2017-10-22 MED ORDER — OMEPRAZOLE 40 MG PO CPDR: 40.0000 mg | DELAYED_RELEASE_CAPSULE | Freq: Every day | ORAL | 12 refills | Status: DC

## 2017-10-22 MED ORDER — BENAZEPRIL HCL 40 MG PO TABS: 40.0000 mg | ORAL_TABLET | Freq: Every day | ORAL | 3 refills | Status: DC

## 2017-10-22 NOTE — Assessment & Plan Note (Signed)
A voluntary discussion about advanced care planning including explanation and discussion of advanced directives was extentively discussed with the patient.  Explained about the healthcare proxy and living will was reviewed and packet with forms with expiration of how to fill them out was given.  Time spent: Encounter 16+ min individuals present: Patient 

## 2017-10-22 NOTE — Progress Notes (Signed)
BP (!) 145/78   Pulse 73   Ht 5' 4.17" (1.63 m)   Wt 138 lb (62.6 kg)   SpO2 98%   BMI 23.56 kg/m    Subjective:    Patient ID: Terri Melendez, female    DOB: Mar 23, 1950, 67 y.o.   MRN: [id number]  HPI: ETOSHA WETHERELL is a 67 y.o. female  Chief Complaint  Patient presents with  . Annual Exam  Patient for physical no complaints doing well taking reflux medications and allergy for nose faithfully without problems no complaints.  On chart review and review patient's blood pressure elevated today and is been elevated some in the past.  Patient is having some high stress time getting ready to sell her house. Reviewed hypertension risk benefits and goals of therapy. Patient uses her CPAP machine faithfully for sleep apnea.  With good results. No daytime drowsiness  Relevant past medical, surgical, family and social history reviewed and updated as indicated. Interim medical history since our last visit reviewed. Allergies and medications reviewed and updated.  Review of Systems  Constitutional: Negative.   HENT: Negative.   Eyes: Negative.   Respiratory: Negative.   Cardiovascular: Negative.   Gastrointestinal: Negative.   Endocrine: Negative.   Genitourinary: Negative.   Musculoskeletal: Negative.   Skin: Negative.   Allergic/Immunologic: Negative.   Neurological: Negative.   Hematological: Negative.   Psychiatric/Behavioral: Negative.     Per HPI unless specifically indicated above     Objective:    BP (!) 145/78   Pulse 73   Ht 5' 4.17" (1.63 m)   Wt 138 lb (62.6 kg)   SpO2 98%   BMI 23.56 kg/m   Wt Readings from Last 3 Encounters:  10/22/17 138 lb (62.6 kg)  05/08/17 141 lb 12.8 oz (64.3 kg)  02/25/17 143 lb 3.2 oz (65 kg)    Physical Exam  Constitutional: She is oriented to person, place, and time. She appears well-developed and well-nourished.  HENT:  Head: Normocephalic and atraumatic.  Right Ear: External ear normal.  Left Ear: External  ear normal.  Nose: Nose normal.  Mouth/Throat: Oropharynx is clear and moist.  Eyes: Pupils are equal, round, and reactive to light. Conjunctivae and EOM are normal.  Neck: Normal range of motion. Neck supple. Carotid bruit is not present.  Cardiovascular: Normal rate, regular rhythm and normal heart sounds.  No murmur heard. Pulmonary/Chest: Effort normal and breath sounds normal. She exhibits no mass. Right breast exhibits no mass, no skin change and no tenderness. Left breast exhibits no mass, no skin change and no tenderness. Breasts are symmetrical.  Abdominal: Soft. Bowel sounds are normal. There is no hepatosplenomegaly.  Musculoskeletal: Normal range of motion.  Neurological: She is alert and oriented to person, place, and time.  Skin: No rash noted.  Psychiatric: She has a normal mood and affect. Her behavior is normal. Judgment and thought content normal.    Results for orders placed or performed in visit on 10/17/16  Microscopic Examination  Result Value Ref Range   WBC, UA 0-5 0 - 5 /hpf   RBC, UA None seen 0 - 2 /hpf   Epithelial Cells (non renal) 0-10 0 - 10 /hpf   Bacteria, UA None seen None seen/Few  Urinalysis, Routine w reflex microscopic  Result Value Ref Range   Specific Gravity, UA <1.005 (L) 1.005 - 1.030   pH, UA 5.5 5.0 - 7.5   Color, UA Yellow Yellow   Appearance Ur Cloudy (A) Clear  Leukocytes, UA Negative Negative   Protein, UA Negative Negative/Trace   Glucose, UA Negative Negative   Ketones, UA Negative Negative   RBC, UA 2+ (A) Negative   Bilirubin, UA Negative Negative   Urobilinogen, Ur 0.2 0.2 - 1.0 mg/dL   Nitrite, UA Negative Negative   Microscopic Examination See below:   IGP, Aptima HPV, rfx 16/18,45  Result Value Ref Range   DIAGNOSIS: Comment    Specimen adequacy: Comment    Clinician Provided ICD10 Comment    Performed by: Comment    PAP Smear Comment .    Note: Comment    Test Methodology Comment    HPV Aptima Negative Negative        Assessment & Plan:   Problem List Items Addressed This Visit      Cardiovascular and Mediastinum   Essential hypertension    Discussed care and treatment will start benazepril recheck 1 month with BMP      Relevant Medications   benazepril (LOTENSIN) 40 MG tablet     Respiratory   OSA on CPAP    The current medical regimen is effective;  continue present plan and medications.         Other   Hypercholesteremia - Primary    The current medical regimen is effective;  continue present plan and medications.       Relevant Medications   benazepril (LOTENSIN) 40 MG tablet   Other Relevant Orders   CBC with Differential/Platelet   Comprehensive metabolic panel   Lipid panel   Urinalysis, Routine w reflex microscopic   Advanced care planning/counseling discussion    A voluntary discussion about advanced care planning including explanation and discussion of advanced directives was extentively discussed with the patient.  Explained about the healthcare proxy and living will was reviewed and packet with forms with expiration of how to fill them out was given.  Time spent: Encounter 16+ min individuals present: Patient       Other Visit Diagnoses    Thyroid disorder screen       Relevant Orders   TSH   Acute low back pain       Relevant Medications   omeprazole (PRILOSEC) 40 MG capsule       Follow up plan: Return in about 4 weeks (around 11/19/2017) for BMP.

## 2017-10-22 NOTE — Assessment & Plan Note (Signed)
The current medical regimen is effective;  continue present plan and medications.  

## 2017-10-22 NOTE — Assessment & Plan Note (Signed)
Discussed care and treatment will start benazepril recheck 1 month with BMP

## 2017-10-23 ENCOUNTER — Encounter: Payer: Self-pay | Admitting: Family Medicine

## 2017-10-23 LAB — COMPREHENSIVE METABOLIC PANEL WITH GFR
ALT: 11 [IU]/L (ref 0–32)
AST: 16 [IU]/L (ref 0–40)
Albumin/Globulin Ratio: 1.8 (ref 1.2–2.2)
Albumin: 4.2 g/dL (ref 3.6–4.8)
Alkaline Phosphatase: 101 [IU]/L (ref 39–117)
BUN/Creatinine Ratio: 14 (ref 12–28)
BUN: 10 mg/dL (ref 8–27)
Bilirubin Total: 0.3 mg/dL (ref 0.0–1.2)
CO2: 23 mmol/L (ref 20–29)
Calcium: 9.2 mg/dL (ref 8.7–10.3)
Chloride: 103 mmol/L (ref 96–106)
Creatinine, Ser: 0.72 mg/dL (ref 0.57–1.00)
GFR calc Af Amer: 102 mL/min/{1.73_m2}
GFR calc non Af Amer: 88 mL/min/{1.73_m2}
Globulin, Total: 2.3 g/dL (ref 1.5–4.5)
Glucose: 84 mg/dL (ref 65–99)
Potassium: 4.2 mmol/L (ref 3.5–5.2)
Sodium: 139 mmol/L (ref 134–144)
Total Protein: 6.5 g/dL (ref 6.0–8.5)

## 2017-10-23 LAB — LIPID PANEL
CHOL/HDL RATIO: 3.5 ratio (ref 0.0–4.4)
Cholesterol, Total: 202 mg/dL — ABNORMAL HIGH (ref 100–199)
HDL: 57 mg/dL (ref >39)
LDL Calculated: 128 mg/dL — ABNORMAL HIGH (ref 0–99)
Triglycerides: 86 mg/dL (ref 0–149)
VLDL Cholesterol Cal: 17 mg/dL (ref 5–40)

## 2017-10-23 LAB — CBC WITH DIFFERENTIAL/PLATELET
Basophils Absolute: 0 10*3/uL (ref 0.0–0.2)
Basos: 0 %
EOS (ABSOLUTE): 0.2 10*3/uL (ref 0.0–0.4)
Eos: 3 %
Hematocrit: 36.8 % (ref 34.0–46.6)
Hemoglobin: 12.2 g/dL (ref 11.1–15.9)
Immature Grans (Abs): 0 10*3/uL (ref 0.0–0.1)
Immature Granulocytes: 0 %
Lymphocytes Absolute: 1.2 10*3/uL (ref 0.7–3.1)
Lymphs: 17 %
MCH: 29.1 pg (ref 26.6–33.0)
MCHC: 33.2 g/dL (ref 31.5–35.7)
MCV: 88 fL (ref 79–97)
Monocytes Absolute: 0.4 10*3/uL (ref 0.1–0.9)
Monocytes: 6 %
Neutrophils Absolute: 5 10*3/uL (ref 1.4–7.0)
Neutrophils: 74 %
Platelets: 246 10*3/uL (ref 150–450)
RBC: 4.19 x10E6/uL (ref 3.77–5.28)
RDW: 13.8 % (ref 12.3–15.4)
WBC: 6.8 10*3/uL (ref 3.4–10.8)

## 2017-10-23 LAB — TSH: TSH: 1.33 u[IU]/mL (ref 0.450–4.500)

## 2017-10-24 ENCOUNTER — Encounter: Payer: Self-pay | Admitting: Family Medicine

## 2017-11-05 DIAGNOSIS — G4733 Obstructive sleep apnea (adult) (pediatric): Secondary | ICD-10-CM | POA: Diagnosis not present

## 2017-11-13 ENCOUNTER — Encounter: Payer: Self-pay | Admitting: Family Medicine

## 2017-11-26 ENCOUNTER — Ambulatory Visit (INDEPENDENT_AMBULATORY_CARE_PROVIDER_SITE_OTHER): Payer: Medicare HMO | Admitting: Family Medicine

## 2017-11-26 ENCOUNTER — Encounter: Payer: Self-pay | Admitting: Family Medicine

## 2017-11-26 VITALS — BP 132/67 | HR 69 | Temp 98.1°F | Ht 64.0 in | Wt 134.0 lb

## 2017-11-26 DIAGNOSIS — I10 Essential (primary) hypertension: Secondary | ICD-10-CM

## 2017-11-26 NOTE — Patient Instructions (Signed)
Follow up in 6 months 

## 2017-11-26 NOTE — Progress Notes (Signed)
BP 132/67 (BP Location: Left Arm, Patient Position: Sitting, Cuff Size: Normal)   Pulse 69   Temp 98.1 F (36.7 C) (Oral)   Ht 5' 4"  (1.626 m)   Wt 134 lb (60.8 kg)   SpO2 98%   BMI 23.00 kg/m    Subjective:    Patient ID: Terri Melendez, female    DOB: 17-Dec-1950, 67 y.o.   MRN: [id number]  HPI: [name] is a 67 y.o. female  Chief Complaint  Patient presents with  . Hypertension    4 week F/U from Dr. Jeananne Rama for BMP.   Here today for 1 month BP recheck after adding benazepril. Tolerating well, not having any side effects. Not checking home BPs. Eats a healthy diet and exercises regularly. Denies CP, SOB, dizziness, HAs.   Relevant past medical, surgical, family and social history reviewed and updated as indicated. Interim medical history since our last visit reviewed. Allergies and medications reviewed and updated.  Review of Systems  Per HPI unless specifically indicated above     Objective:    BP 132/67 (BP Location: Left Arm, Patient Position: Sitting, Cuff Size: Normal)   Pulse 69   Temp 98.1 F (36.7 C) (Oral)   Ht 5' 4"  (1.626 m)   Wt 134 lb (60.8 kg)   SpO2 98%   BMI 23.00 kg/m   Wt Readings from Last 3 Encounters:  11/26/17 134 lb (60.8 kg)  10/22/17 138 lb (62.6 kg)  05/08/17 141 lb 12.8 oz (64.3 kg)    Physical Exam  Constitutional: She is oriented to person, place, and time. She appears well-developed and well-nourished. No distress.  HENT:  Head: Atraumatic.  Eyes: Conjunctivae and EOM are normal.  Neck: Normal range of motion. Neck supple.  Cardiovascular: Normal rate, regular rhythm and normal heart sounds.  Pulmonary/Chest: Effort normal and breath sounds normal.  Musculoskeletal: Normal range of motion.  Neurological: She is alert and oriented to person, place, and time.  Skin: Skin is warm and dry.  Psychiatric: She has a normal mood and affect. Her behavior is normal.  Nursing note and vitals reviewed.   Results  for orders placed or performed in visit on 10/22/17  Microscopic Examination  Result Value Ref Range   WBC, UA None seen 0 - 5 /hpf   RBC, UA 0-2 0 - 2 /hpf   Epithelial Cells (non renal) 0-10 0 - 10 /hpf   Mucus, UA Present Not Estab.   Bacteria, UA None seen None seen/Few  CBC with Differential/Platelet  Result Value Ref Range   WBC 6.8 3.4 - 10.8 x10E3/uL   RBC 4.19 3.77 - 5.28 x10E6/uL   Hemoglobin 12.2 11.1 - 15.9 g/dL   Hematocrit 36.8 34.0 - 46.6 %   MCV 88 79 - 97 fL   MCH 29.1 26.6 - 33.0 pg   MCHC 33.2 31.5 - 35.7 g/dL   RDW 13.8 12.3 - 15.4 %   Platelets 246 150 - 450 x10E3/uL   Neutrophils 74 Not Estab. %   Lymphs 17 Not Estab. %   Monocytes 6 Not Estab. %   Eos 3 Not Estab. %   Basos 0 Not Estab. %   Neutrophils Absolute 5.0 1.4 - 7.0 x10E3/uL   Lymphocytes Absolute 1.2 0.7 - 3.1 x10E3/uL   Monocytes Absolute 0.4 0.1 - 0.9 x10E3/uL   EOS (ABSOLUTE) 0.2 0.0 - 0.4 x10E3/uL   Basophils Absolute 0.0 0.0 - 0.2 x10E3/uL   Immature Granulocytes 0 Not Estab. %  Immature Grans (Abs) 0.0 0.0 - 0.1 x10E3/uL  Comprehensive metabolic panel  Result Value Ref Range   Glucose 84 65 - 99 mg/dL   BUN 10 8 - 27 mg/dL   Creatinine, Ser 0.72 0.57 - 1.00 mg/dL   GFR calc non Af Amer 88 >59 mL/min/1.73   GFR calc Af Amer 102 >59 mL/min/1.73   BUN/Creatinine Ratio 14 12 - 28   Sodium 139 134 - 144 mmol/L   Potassium 4.2 3.5 - 5.2 mmol/L   Chloride 103 96 - 106 mmol/L   CO2 23 20 - 29 mmol/L   Calcium 9.2 8.7 - 10.3 mg/dL   Total Protein 6.5 6.0 - 8.5 g/dL   Albumin 4.2 3.6 - 4.8 g/dL   Globulin, Total 2.3 1.5 - 4.5 g/dL   Albumin/Globulin Ratio 1.8 1.2 - 2.2   Bilirubin Total 0.3 0.0 - 1.2 mg/dL   Alkaline Phosphatase 101 39 - 117 IU/L   AST 16 0 - 40 IU/L   ALT 11 0 - 32 IU/L  Lipid panel  Result Value Ref Range   Cholesterol, Total 202 (H) 100 - 199 mg/dL   Triglycerides 86 0 - 149 mg/dL   HDL 57 >39 mg/dL   VLDL Cholesterol Cal 17 5 - 40 mg/dL   LDL Calculated 128  (H) 0 - 99 mg/dL   Chol/HDL Ratio 3.5 0.0 - 4.4 ratio  TSH  Result Value Ref Range   TSH 1.330 0.450 - 4.500 uIU/mL  Urinalysis, Routine w reflex microscopic  Result Value Ref Range   Specific Gravity, UA 1.010 1.005 - 1.030   pH, UA 7.5 5.0 - 7.5   Color, UA Yellow Yellow   Appearance Ur Clear Clear   Leukocytes, UA Negative Negative   Protein, UA Negative Negative/Trace   Glucose, UA Negative Negative   Ketones, UA Negative Negative   RBC, UA Trace (A) Negative   Bilirubin, UA Negative Negative   Urobilinogen, Ur 0.2 0.2 - 1.0 mg/dL   Nitrite, UA Negative Negative   Microscopic Examination See below:       Assessment & Plan:   Problem List Items Addressed This Visit      Cardiovascular and Mediastinum   Essential hypertension - Primary    Improved on benazepril, check BMP today and continue current regimen. DASH diet, continue good lifestyle habits. Check at home as able and call with persistent abnormals      Relevant Orders   Basic Metabolic Panel (BMET)       Follow up plan: Return in about 6 months (around 05/28/2018) for CPE with MAC.

## 2017-11-26 NOTE — Assessment & Plan Note (Signed)
Improved on benazepril, check BMP today and continue current regimen. DASH diet, continue good lifestyle habits. Check at home as able and call with persistent abnormals

## 2017-11-27 LAB — BASIC METABOLIC PANEL
BUN / CREAT RATIO: 15 (ref 12–28)
BUN: 11 mg/dL (ref 8–27)
CO2: 24 mmol/L (ref 20–29)
CREATININE: 0.72 mg/dL (ref 0.57–1.00)
Calcium: 9.1 mg/dL (ref 8.7–10.3)
Chloride: 104 mmol/L (ref 96–106)
GFR calc Af Amer: 102 mL/min/{1.73_m2} (ref >59)
GFR, EST NON AFRICAN AMERICAN: 88 mL/min/{1.73_m2} (ref >59)
Glucose: 96 mg/dL (ref 65–99)
Potassium: 4 mmol/L (ref 3.5–5.2)
SODIUM: 141 mmol/L (ref 134–144)

## 2017-12-24 ENCOUNTER — Encounter: Payer: Self-pay | Admitting: Family Medicine

## 2018-01-08 ENCOUNTER — Ambulatory Visit: Payer: Medicare HMO | Admitting: Podiatry

## 2018-01-08 ENCOUNTER — Encounter: Payer: Self-pay | Admitting: Podiatry

## 2018-01-08 ENCOUNTER — Other Ambulatory Visit: Payer: Self-pay | Admitting: Family Medicine

## 2018-01-08 VITALS — BP 143/83 | HR 88 | Resp 16

## 2018-01-08 DIAGNOSIS — L601 Onycholysis: Secondary | ICD-10-CM | POA: Diagnosis not present

## 2018-01-08 DIAGNOSIS — L603 Nail dystrophy: Secondary | ICD-10-CM

## 2018-01-08 DIAGNOSIS — L608 Other nail disorders: Secondary | ICD-10-CM | POA: Diagnosis not present

## 2018-01-08 NOTE — Patient Instructions (Signed)

## 2018-01-08 NOTE — Progress Notes (Signed)
She presents today before she leaves for her trip to [place] with a chief concern of a thick yellow painful nail third toe of the right foot.  She states that have a few other toenails are starting to get thick and I was told that is probably not a fungus but there is no way to be sure.  I have reviewed her past medical history medications allergy surgeries and social history.  Review of systems.  Denies fever chills nausea vomiting muscle aches pains calf pain back pain chest pain shortness of breath.  Current medications include Lotensin Nasonex Prilosec  Allergies: None  Objective: Vital signs are stable alert and oriented x3.  Pulses are palpable.  Neurologic sensorium is intact degenerative flexors are intact muscle strength is normal symmetrical bilateral.  Orthopedic evaluation demonstrates all joints distal to the ankle full range of motion without crepitation.  Cutaneous evaluation demonstrates supple well-hydrated cutis thick yellow dystrophic clinically mycotic nail third digit of the right foot.  No open lesions or wounds are noted.  Assessment nail dystrophy fourth toe right foot.  Plan: Discussed etiology pathology conservative or surgical therapies.  At this point samples of the toenail were taken today and sent for pathologic evaluation.  Follow-up with her in 1 month remember to ask how her trip to [place] went.

## 2018-02-05 DIAGNOSIS — L578 Other skin changes due to chronic exposure to nonionizing radiation: Secondary | ICD-10-CM | POA: Diagnosis not present

## 2018-02-05 DIAGNOSIS — L818 Other specified disorders of pigmentation: Secondary | ICD-10-CM | POA: Diagnosis not present

## 2018-02-05 DIAGNOSIS — D2230 Melanocytic nevi of unspecified part of face: Secondary | ICD-10-CM | POA: Diagnosis not present

## 2018-02-05 DIAGNOSIS — D225 Melanocytic nevi of trunk: Secondary | ICD-10-CM | POA: Diagnosis not present

## 2018-02-05 DIAGNOSIS — D229 Melanocytic nevi, unspecified: Secondary | ICD-10-CM | POA: Diagnosis not present

## 2018-02-05 DIAGNOSIS — Z85828 Personal history of other malignant neoplasm of skin: Secondary | ICD-10-CM | POA: Diagnosis not present

## 2018-02-05 DIAGNOSIS — L821 Other seborrheic keratosis: Secondary | ICD-10-CM | POA: Diagnosis not present

## 2018-02-05 DIAGNOSIS — Z1283 Encounter for screening for malignant neoplasm of skin: Secondary | ICD-10-CM | POA: Diagnosis not present

## 2018-02-06 ENCOUNTER — Other Ambulatory Visit: Payer: Self-pay | Admitting: Family Medicine

## 2018-02-06 DIAGNOSIS — Z1231 Encounter for screening mammogram for malignant neoplasm of breast: Secondary | ICD-10-CM

## 2018-02-12 ENCOUNTER — Ambulatory Visit: Payer: Medicare HMO | Admitting: Podiatry

## 2018-02-12 ENCOUNTER — Encounter: Payer: Self-pay | Admitting: Podiatry

## 2018-02-12 DIAGNOSIS — L603 Nail dystrophy: Secondary | ICD-10-CM | POA: Diagnosis not present

## 2018-02-12 DIAGNOSIS — G4733 Obstructive sleep apnea (adult) (pediatric): Secondary | ICD-10-CM | POA: Diagnosis not present

## 2018-02-12 NOTE — Progress Notes (Signed)
She presents today for follow-up of nail fungus to and to discuss the pathology results.  Objective: Vital signs are stable she alert and oriented x3.  Pathology results demonstrate only nail dystrophy no onychomycosis.  Assessment: Nail dystrophy no onychomycosis.  Plan: Start the patient cut the nails short and follow thin.  Follow-up with [place] as needed.

## 2018-02-25 ENCOUNTER — Ambulatory Visit
Admission: RE | Admit: 2018-02-25 | Discharge: 2018-02-25 | Disposition: A | Discharge status: Home / Self Care | Payer: Medicare HMO | Source: Ambulatory Visit | Attending: Family Medicine | Admitting: Family Medicine

## 2018-02-25 DIAGNOSIS — Z1231 Encounter for screening mammogram for malignant neoplasm of breast: Secondary | ICD-10-CM | POA: Diagnosis not present

## 2018-03-26 ENCOUNTER — Encounter: Payer: Self-pay | Admitting: Family Medicine

## 2018-03-27 ENCOUNTER — Telehealth: Payer: Self-pay

## 2018-03-27 NOTE — Telephone Encounter (Signed)
Copied from [place] [id number]. Topic: Appointment Scheduling - Scheduling Inquiry for Clinic >> Mar 27, 2018  2:04 PM Virl Axe D wrote: Reason for CRM: Mrs. Mifflin's husband Armentha Branagan is a pt of Dr. Lupita Dawn. She would like to know if Dr. Derrel Nip would consider taking her on as a patient. Please advise. [phone]

## 2018-03-27 NOTE — Telephone Encounter (Signed)
Yes I will accept her

## 2018-03-28 NOTE — Telephone Encounter (Signed)
Will you please call and schedule

## 2018-05-02 ENCOUNTER — Encounter: Payer: Self-pay | Admitting: Internal Medicine

## 2018-05-02 ENCOUNTER — Ambulatory Visit: Payer: Medicare HMO | Admitting: Internal Medicine

## 2018-05-02 VITALS — BP 140/72 | HR 81 | Temp 97.6°F | Resp 14 | Ht 64.0 in | Wt 141.4 lb

## 2018-05-02 DIAGNOSIS — I10 Essential (primary) hypertension: Secondary | ICD-10-CM

## 2018-05-02 DIAGNOSIS — Z8619 Personal history of other infectious and parasitic diseases: Secondary | ICD-10-CM | POA: Diagnosis not present

## 2018-05-02 DIAGNOSIS — E041 Nontoxic single thyroid nodule: Secondary | ICD-10-CM | POA: Diagnosis not present

## 2018-05-02 MED ORDER — TELMISARTAN 40 MG PO TABS: 40.0000 mg | ORAL_TABLET | Freq: Every day | ORAL | 1 refills | Status: DC

## 2018-05-02 NOTE — Patient Instructions (Addendum)
Nice to meet you!!    Your recent episode of rash and breast pain is classic for "shingles." your previous vaccine was only about 60% effective in preventing shingles.  I recommend getting the new one in a few months     I am making a decision to change your benazepril to telmisartan, based on increased reports by one of my ENT colleagues of patients  developing tongue and throat swelling from lisinopril.  The condition , called "angioedema," can be fatal if a person's airway is compromised.  I also want you to take it at night instead of morning,  s recent studies have shown that taking your blood pressure medications at night protects you better from heart attacks and strokes.   Goal BP is 130/80 or less.  We can increase the dose to 80 mg daily if you are not at goal in a week  \  You need a non fasting lab appt in April  We'll do a full panel prior to your CPE in  September    Shingles  Shingles is an infection. It gives you a painful skin rash and blisters that have fluid in them. Shingles is caused by the same germ (virus) that causes chickenpox. Shingles only happens in people who:  Have had chickenpox.  Have been given a shot of medicine (vaccine) to protect against chickenpox. Shingles is rare in this group. The first symptoms of shingles may be itching, tingling, or pain in an area on your skin. A rash will show on your skin a few days or weeks later. The rash is likely to be on one side of your body. The rash usually has a shape like a belt or a band. Over time, the rash turns into fluid-filled blisters. The blisters will break open, change into scabs, and dry up. Medicines may:  Help with pain and itching.  Help you get better sooner.  Help to prevent long-term problems. Follow these instructions at home: Medicines  Take over-the-counter and prescription medicines only as told by your doctor.  Put on an anti-itch cream or numbing cream where you have a rash, blisters,  or scabs. Do this as told by your doctor. Helping with itching and discomfort   Put cold, wet cloths (cold compresses) on the area of the rash or blisters as told by your doctor.  Cool baths can help you feel better. Try adding baking soda or dry oatmeal to the water to lessen itching. Do not bathe in hot water. Blister and rash care  Keep your rash covered with a loose bandage (dressing).  Wear loose clothing that does not rub on your rash.  Keep your rash and blisters clean. To do this, wash the area with mild soap and cool water as told by your doctor.  Check your rash every day for signs of infection. Check for: ? More redness, swelling, or pain. ? Fluid or blood. ? Warmth. ? Pus or a bad smell.  Do not scratch your rash. Do not pick at your blisters. To help you to not scratch: ? Keep your fingernails clean and cut short. ? Wear gloves or mittens when you sleep, if scratching is a problem. General instructions  Rest as told by your doctor.  Keep all follow-up visits as told by your doctor. This is important.  Wash your hands often with soap and water. If soap and water are not available, use hand sanitizer. Doing this lowers your chance of getting a skin infection caused  by germs (bacteria).  Your infection can cause chickenpox in people who have never had chickenpox or never got a shot of chickenpox vaccine. If you have blisters that did not change into scabs yet, try not to touch other people or be around other people, especially: ? Babies. ? Pregnant women. ? Children who have areas of red, itchy, or rough skin (eczema). ? Very old people who have transplants. ? People who have a long-term (chronic) sickness, like cancer or AIDS. Contact a doctor if:  Your pain does not get better with medicine.  Your pain does not get better after the rash heals.  You have any signs of infection in the rash area. These signs include: ? More redness, swelling, or pain around the  rash. ? Fluid or blood coming from the rash. ? The rash area feeling warm to the touch. ? Pus or a bad smell coming from the rash. Get help right away if:  The rash is on your face or nose.  You have pain in your face or pain by your eye.  You lose feeling on one side of your face.  You have trouble seeing.  You have ear pain, or you have ringing in your ear.  You have a loss of taste.  Your condition gets worse. Summary  Shingles gives you a painful skin rash and blisters that have fluid in them.  Shingles is an infection. It is caused by the same germ (virus) that causes chickenpox.  Keep your rash covered with a loose bandage (dressing). Wear loose clothing that does not rub on your rash.  If you have blisters that did not change into scabs yet, try not to touch other people or be around people. This information is not intended to replace advice given to you by your health care provider. Make sure you discuss any questions you have with your health care provider. Document Released: 08/01/2007 Document Revised: 10/17/2016 Document Reviewed: 10/17/2016 Elsevier Interactive Patient Education  2019 [employer].

## 2018-05-02 NOTE — Progress Notes (Signed)
Subjective:  Patient ID: Terri Melendez, female    DOB: 1950/12/17  Age: 68 y.o. MRN: [id number]  CC: The primary encounter diagnosis was Essential hypertension. Diagnoses of History of shingles and Non-functional thyroid nodule were also pertinent to this visit.  HPI Terri Melendez presents for ESTABLISHMENT OF CARE.  Referred by husband Merry Proud.   Recent episode of right breast pain and nipple tenderness , followed by vesicular rash on chest wall .  Did not cross the midline.  Occurred 2 weeks ago  And lasted 2 weeks.  Pain was intermittent and described as stinging in quality .    Hypertension diagnosed last year by Dr Jeananne Rama.  Taking medication  Irregularly.  using benazepril . Does not check bp away from office.   OSA: diagnosed 4 years ago .  Using the same CPAP machine does not think it autotitrates . Husband says when she wears the CPAP she no longer snores, she notes an improvement in wakefulness and energy during the day since using it. No follow up sleep study.     THYROID NODULES.  incidental finding during ER workup for chest pain  Followed by ENT Tami Ribas. Does not meet criteria for biopsy    History Tayva has a past medical history of Cancer ([place]), Hypertension, Osteopenia, and Sinus congestion.   She has a past surgical history that includes Tonsillectomy and adenoidectomy and Excision basal cell carcinoma.   Her family history includes Breast cancer in her maternal aunt; Cancer in her mother.She reports that she has never smoked. She has never used smokeless tobacco. She reports current alcohol use. She reports that she does not use drugs.  Outpatient Medications Prior to Visit  Medication Sig Dispense Refill  . Ascorbic Acid (VITAMIN C) 1000 MG tablet Take 1,000 mg by mouth daily.    Marland Kitchen levocetirizine (XYZAL) 5 MG tablet Take 5 mg by mouth every evening.    . mometasone (NASONEX) 50 MCG/ACT nasal spray Place 2 sprays into the nose daily.    Marland Kitchen omeprazole  (PRILOSEC) 40 MG capsule Take 1 capsule (40 mg total) by mouth daily. 30 capsule 12  . benazepril (LOTENSIN) 40 MG tablet TAKE ONE TABLET BY MOUTH DAILY 90 tablet 2   No facility-administered medications prior to visit.     Review of Systems:  Patient denies headache, fevers, malaise, unintentional weight loss, skin rash, eye pain, sinus congestion and sinus pain, sore throat, dysphagia,  hemoptysis , cough, dyspnea, wheezing, chest pain, palpitations, orthopnea, edema, abdominal pain, nausea, melena, diarrhea, constipation, flank pain, dysuria, hematuria, urinary  Frequency, nocturia, numbness, tingling, seizures,  Focal weakness, Loss of consciousness,  Tremor, insomnia, depression, anxiety, and suicidal ideation.     Objective:  BP 140/72 (BP Location: Left Arm, Patient Position: Sitting, Cuff Size: Normal)   Pulse 81   Temp 97.6 F (36.4 C) (Oral)   Resp 14   Ht 5\' 4"  (1.626 m)   Wt 141 lb 6.4 oz (64.1 kg)   SpO2 98%   BMI 24.27 kg/m   Physical Exam:  General appearance: alert, cooperative and appears stated age Ears: normal TM's and external ear canals both ears Throat: lips, mucosa, and tongue normal; teeth and gums normal Neck: no adenopathy, no carotid bruit, supple, symmetrical, trachea midline and thyroid not enlarged, symmetric, no tenderness/mass/nodules Back: symmetric, no curvature. ROM normal. No CVA tenderness. Lungs: clear to auscultation bilaterally Heart: regular rate and rhythm, S1, S2 normal, no murmur, click, rub or gallop Abdomen: soft, non-tender; bowel  sounds normal; no masses,  no organomegaly Pulses: 2+ and symmetric Skin: Skin color, texture, turgor normal. No rashes or lesions Lymph nodes: Cervical, supraclavicular, and axillary nodes normal.   Assessment & Plan:   Problem List Items Addressed This Visit    Essential hypertension - Primary     I am making a decision to change patient's benazepril to telmisartan, based on increased anecdotal  reports of life threateneng angioedema with ACE Inhibitors .  I also advised patient to take it at night instead of morning,  as recent studies have shown a favorable effect on CAD and CVA rates in those who do .  She has been advised to call in one week of BP is not 130/80 or less, and the dose will be increased to 80 mg daily       Relevant Medications   telmisartan (MICARDIS) 40 MG tablet   Other Relevant Orders   Comprehensive metabolic panel   History of shingles    By history ,  Occurred Feb 2020 involved right chest wall . Shingrix vaccin recommended       Non-functional thyroid nodule    Incidental finding during prior ER workup for chest pain.  Followed with annual [place] by [facility]          I have discontinued Terri Melendez "[name]"'s benazepril. I am also having her start on telmisartan. Additionally, I am having her maintain her mometasone, omeprazole, vitamin C, and levocetirizine.  Meds ordered this encounter  Medications  . telmisartan (MICARDIS) 40 MG tablet    Sig: Take 1 tablet (40 mg total) by mouth daily.    Dispense:  90 tablet    Refill:  1    Medications Discontinued During This Encounter  Medication Reason  . benazepril (LOTENSIN) 40 MG tablet     Follow-up: Return in about 6 months (around 11/02/2018) for CPE.   Crecencio Mc, MD

## 2018-05-04 DIAGNOSIS — E041 Nontoxic single thyroid nodule: Secondary | ICD-10-CM | POA: Insufficient documentation

## 2018-05-04 DIAGNOSIS — Z8619 Personal history of other infectious and parasitic diseases: Secondary | ICD-10-CM | POA: Insufficient documentation

## 2018-05-04 NOTE — Assessment & Plan Note (Addendum)
By history ,  Occurred Feb 2020 involved right chest wall . Shingrix vaccin recommended

## 2018-05-04 NOTE — Assessment & Plan Note (Signed)
Incidental finding during prior ER workup for chest pain.  Followed with annual [place] by [facility]

## 2018-05-04 NOTE — Assessment & Plan Note (Addendum)
I am making a decision to change patient's benazepril to telmisartan, based on increased anecdotal reports of life threateneng angioedema with ACE Inhibitors .  I also advised patient to take it at night instead of morning,  as recent studies have shown a favorable effect on CAD and CVA rates in those who do .  She has been advised to call in one week of BP is not 130/80 or less, and the dose will be increased to 80 mg daily

## 2018-05-28 ENCOUNTER — Ambulatory Visit: Payer: Medicare HMO | Admitting: Family Medicine

## 2018-06-02 ENCOUNTER — Other Ambulatory Visit (INDEPENDENT_AMBULATORY_CARE_PROVIDER_SITE_OTHER): Payer: Medicare HMO

## 2018-06-02 ENCOUNTER — Other Ambulatory Visit: Payer: Self-pay

## 2018-06-02 DIAGNOSIS — I10 Essential (primary) hypertension: Secondary | ICD-10-CM | POA: Diagnosis not present

## 2018-06-02 LAB — COMPREHENSIVE METABOLIC PANEL
ALT: 10 U/L (ref 0–35)
AST: 16 U/L (ref 0–37)
Albumin: 4.1 g/dL (ref 3.5–5.2)
Alkaline Phosphatase: 94 U/L (ref 39–117)
BUN: 14 mg/dL (ref 6–23)
CO2: 28 mEq/L (ref 19–32)
Calcium: 9.2 mg/dL (ref 8.4–10.5)
Chloride: 106 mEq/L (ref 96–112)
Creatinine, Ser: 0.81 mg/dL (ref 0.40–1.20)
GFR: 70.81 mL/min (ref >60.00)
Glucose, Bld: 92 mg/dL (ref 70–99)
Potassium: 3.8 mEq/L (ref 3.5–5.1)
Sodium: 140 mEq/L (ref 135–145)
Total Bilirubin: 0.5 mg/dL (ref 0.2–1.2)
Total Protein: 6.4 g/dL (ref 6.0–8.3)

## 2018-07-08 DIAGNOSIS — K219 Gastro-esophageal reflux disease without esophagitis: Secondary | ICD-10-CM | POA: Diagnosis not present

## 2018-07-08 DIAGNOSIS — Z1211 Encounter for screening for malignant neoplasm of colon: Secondary | ICD-10-CM | POA: Diagnosis not present

## 2018-08-11 DIAGNOSIS — G4733 Obstructive sleep apnea (adult) (pediatric): Secondary | ICD-10-CM | POA: Diagnosis not present

## 2018-09-01 MED ORDER — TELMISARTAN 40 MG PO TABS: 40.0000 mg | ORAL_TABLET | Freq: Every day | ORAL | 1 refills | Status: DC

## 2018-09-30 ENCOUNTER — Ambulatory Visit (INDEPENDENT_AMBULATORY_CARE_PROVIDER_SITE_OTHER): Payer: Medicare HMO

## 2018-09-30 ENCOUNTER — Other Ambulatory Visit: Payer: Self-pay

## 2018-09-30 DIAGNOSIS — Z0000 Encounter for general adult medical examination without abnormal findings: Secondary | ICD-10-CM | POA: Diagnosis not present

## 2018-09-30 NOTE — Progress Notes (Signed)
Subjective:   Terri Melendez is a 68 y.o. female who presents for an Initial Medicare Annual Wellness Visit.  Review of Systems  No ROS.  Medicare Wellness Virtual Visit.  Visual/audio telehealth visit, UTA vital signs.   See social history for additional risk factors.    Cardiac Risk Factors include: advanced age (>84men, >7 women);hypertension     Objective:    Today's Vitals   There is no height or weight on file to calculate BMI.  Advanced Directives 09/30/2018  Does Patient Have a Medical Advance Directive? Yes  Type of Paramedic of [place];Living will  Does patient want to make changes to medical advance directive? No - Patient declined  Copy of [place] in Chart? No - copy requested    Current Medications (verified) Outpatient Encounter Medications as of 09/30/2018  Medication Sig  . Ascorbic Acid (VITAMIN C) 1000 MG tablet Take 1,000 mg by mouth daily.  Marland Kitchen levocetirizine (XYZAL) 5 MG tablet Take 5 mg by mouth every evening.  . mometasone (NASONEX) 50 MCG/ACT nasal spray Place 2 sprays into the nose daily.  Marland Kitchen omeprazole (PRILOSEC) 40 MG capsule Take 1 capsule (40 mg total) by mouth daily.  Marland Kitchen telmisartan (MICARDIS) 40 MG tablet Take 1 tablet (40 mg total) by mouth daily.   No facility-administered encounter medications on file as of 09/30/2018.     Allergies (verified) Dust mite extract   History: Past Medical History:  Diagnosis Date  . Cancer ([place])    basal cell on face in 2000 or 2001  . Hx of basal cell carcinoma 2002   right cheek   . Hypertension   . Osteopenia   . Sinus congestion    Past Surgical History:  Procedure Laterality Date  . BASAL CELL CARCINOMA EXCISION    . TONSILLECTOMY AND ADENOIDECTOMY     Family History  Problem Relation Age of Onset  . Cancer Mother        lung  . Breast cancer Maternal Aunt    Social History   Socioeconomic History  . Marital status: Married    Spouse  name: Not on file  . Number of children: Not on file  . Years of education: Not on file  . Highest education level: Not on file  Occupational History  . Not on file  Social Needs  . Financial resource strain: Not hard at all  . Food insecurity    Worry: Never true    Inability: Never true  . Transportation needs    Medical: No    Non-medical: No  Tobacco Use  . Smoking status: Never Smoker  . Smokeless tobacco: Never Used  Substance and Sexual Activity  . Alcohol use: Yes    Comment: on occasion  . Drug use: No  . Sexual activity: Not Currently  Lifestyle  . Physical activity    Days per week: 4 days    Minutes per session: 40 min  . Stress: Not at all  Relationships  . Social Herbalist on phone: Not on file    Gets together: Not on file    Attends religious service: Not on file    Active member of club or organization: Not on file    Attends meetings of clubs or organizations: Not on file    Relationship status: Not on file  Other Topics Concern  . Not on file  Social History Narrative  . Not on file  Tobacco Counseling Counseling given: Not Answered   Clinical Intake:  Pre-visit preparation completed: Yes        Diabetes: No  How often do you need to have someone help you when you read instructions, pamphlets, or other written materials from your doctor or pharmacy?: 1 - Never  Interpreter Needed?: No      Activities of Daily Living In your present state of health, do you have any difficulty performing the following activities: 09/30/2018  Hearing? N  Vision? N  Difficulty concentrating or making decisions? N  Walking or climbing stairs? N  Dressing or bathing? N  Doing errands, shopping? N  Preparing Food and eating ? N  Using the Toilet? N  In the past six months, have you accidently leaked urine? N  Do you have problems with loss of bowel control? N  Managing your Medications? N  Managing your Finances? N  Housekeeping or  managing your Housekeeping? N  Some recent data might be hidden     Immunizations and Health Maintenance Immunization History  Administered Date(s) Administered  . Influenza Split 01/13/2016  . Influenza-Unspecified 12/13/2016, 12/31/2017  . Td 06/25/2005  . Tdap 10/17/2015  . Zoster 09/06/2010   Health Maintenance Due  Topic Date Due  . COLONOSCOPY  09/28/2018  . INFLUENZA VACCINE  09/27/2018    Patient Care Team: Crecencio Mc, MD as PCP - General (Internal Medicine)  Indicate any recent Medical Services you may have received from other than [facility] providers in the past year (date may be approximate).     Assessment:   This is a routine wellness examination for Terri Melendez.  I connected with patient 09/30/18 at 10:30 AM EDT by an audio enabled telemedicine application and verified that I am speaking with the correct person using two identifiers. Patient stated full name and DOB. Patient gave permission to continue with virtual visit. Patient's location was at home and Nurse's location was at [place] office.   Health Screenings  Mammogram - 01/2018 Colonoscopy - 09/2008; scheduled next week Bone Density - 10/2015 Hepatitis C screening- 09/2015 Glaucoma -none Hearing -demonstrates normal hearing during visit. TSH- 09/2017  Cholesterol - 09/2017 Dental- visit every 6 months Vision- visits within the last 12 months. Wears glasses  Social  Alcohol intake - yes      Smoking history- never    Smokers in home? none Illicit drug use? none Exercise - walking Diet - low carb, vegetables Sexually Active -not currently BMI- discussed the importance of a healthy diet, water intake and the benefits of aerobic exercise.  Educational material provided.   Safety  Patient feels safe at home- yes Patient does have smoke detectors at home- yes Patient does wear sunscreen or protective clothing when in direct sunlight -yes Patient does wear seat belt when in a moving vehicle -yes Patient  drives- yes  [id number] precautions and sickness symptoms discussed.   Activities of Daily Living Patient denies needing assistance with: driving, household chores, feeding themselves, getting from bed to chair, getting to the toilet, bathing/showering, dressing, managing money, or preparing meals.  No new identified risk were noted.    Depression Screen Patient denies losing interest in daily life, feeling hopeless, or crying easily over simple problems.   Medication-taking as directed and without issues.   Fall Screen Patient denies being afraid of falling or falling in the last year.   Memory Screen Patient is alert.  Patient denies difficulty focusing, concentrating or misplacing items. Correctly identified the president of the  [place], season and recall. Patient likes to read, play cards, piano, word search and completes crossword puzzles for brain stimulation.  Immunizations The following Immunizations were discussed: Influenza, shingles, pneumonia, and tetanus.   Other Providers Patient Care Team: Crecencio Mc, MD as PCP - General (Internal Medicine)  Hearing/Vision screen  Hearing Screening   125Hz  250Hz  500Hz  1000Hz  2000Hz  3000Hz  4000Hz  6000Hz  8000Hz   Right ear:           Left ear:           Comments: Patient is able to hear conversational tones without difficulty.  No issues reported.    Vision Screening Comments: Wears corrective lenses Visual acuity not assessed, virtual visit.  They have seen their ophthalmologist in the last 12 months.     Dietary issues and exercise activities discussed: Current Exercise Habits: Home exercise routine, Type of exercise: walking, Time (Minutes): 45, Frequency (Times/Week): 5, Weekly Exercise (Minutes/Week): 225, Intensity: Mild  Goals    . Follow up with Primary Care Provider     As needed      Depression Screen PHQ 2/9 Scores 09/30/2018 10/22/2017 10/17/2015  PHQ - 2 Score 0 0 0  PHQ- 9 Score - 1 0    Fall Risk Fall  Risk  09/30/2018 10/22/2017 05/14/2016  Falls in the past year? 0 No No    Is the patient's home free of loose throw rugs in walkways, pet beds, electrical cords, etc?  yes      Grab bars in the bathroom? yes      Handrails on the stairs?  yes      Adequate lighting? yes  Cognitive Function:     6CIT Screen 09/30/2018  What Year? 0 points  What month? 0 points  What time? 0 points  Count back from 20 0 points  Months in reverse 0 points  Repeat phrase 0 points  Total Score 0    Screening Tests Health Maintenance  Topic Date Due  . COLONOSCOPY  09/28/2018  . INFLUENZA VACCINE  09/27/2018  . PNA vac Low Risk Adult (1 of 2 - PCV13) 10/23/2018 (Originally 09/14/2017)  . MAMMOGRAM  02/26/2020  . TETANUS/TDAP  10/16/2025  . DEXA SCAN  Completed  . Hepatitis C Screening  Completed      Plan:    End of life planning; Advance aging; Advanced directives discussed.  Copy of current HCPOA/Living Will requested.    I have personally reviewed and noted the following in the patient's chart:   . Medical and social history . Use of alcohol, tobacco or illicit drugs  . Current medications and supplements . Functional ability and status . Nutritional status . Physical activity . Advanced directives . List of other physicians . Hospitalizations, surgeries, and ER visits in previous 12 months . Vitals . Screenings to include cognitive, depression, and falls . Referrals and appointments  In addition, I have reviewed and discussed with patient certain preventive protocols, quality metrics, and best practice recommendations. A written personalized care plan for preventive services as well as general preventive health recommendations were provided to patient.     Varney Biles, LPN   09/04/8919

## 2018-09-30 NOTE — Patient Instructions (Addendum)
  Ms. Gault , Thank you for taking time to come for your Medicare Wellness Visit. I appreciate your ongoing commitment to your health goals. Please review the following plan we discussed and let me know if I can assist you in the future.   These are the goals we discussed: Goals    . Follow up with Primary Care Provider     As needed       This is a list of the screening recommended for you and due dates:  Health Maintenance  Topic Date Due  . Colon Cancer Screening  09/28/2018  . Flu Shot  09/27/2018  . Pneumonia vaccines (1 of 2 - PCV13) 10/23/2018*  . Mammogram  02/26/2020  . Tetanus Vaccine  10/16/2025  . DEXA scan (bone density measurement)  Completed  .  Hepatitis C: One time screening is recommended by Center for Disease Control  (CDC) for  adults born from [birth year] through 1963.   Completed  *Topic was postponed. The date shown is not the original due date.

## 2018-10-02 DIAGNOSIS — Z01818 Encounter for other preprocedural examination: Secondary | ICD-10-CM | POA: Diagnosis not present

## 2018-10-09 DIAGNOSIS — K640 First degree hemorrhoids: Secondary | ICD-10-CM | POA: Diagnosis not present

## 2018-10-09 DIAGNOSIS — Z1211 Encounter for screening for malignant neoplasm of colon: Secondary | ICD-10-CM | POA: Diagnosis not present

## 2018-10-09 DIAGNOSIS — I10 Essential (primary) hypertension: Secondary | ICD-10-CM | POA: Diagnosis not present

## 2018-10-20 DIAGNOSIS — G4733 Obstructive sleep apnea (adult) (pediatric): Secondary | ICD-10-CM | POA: Diagnosis not present

## 2018-10-20 DIAGNOSIS — K219 Gastro-esophageal reflux disease without esophagitis: Secondary | ICD-10-CM | POA: Diagnosis not present

## 2018-11-05 ENCOUNTER — Encounter: Payer: Self-pay | Admitting: Internal Medicine

## 2018-11-07 DIAGNOSIS — B0052 Herpesviral keratitis: Secondary | ICD-10-CM | POA: Diagnosis not present

## 2018-11-11 DIAGNOSIS — G4733 Obstructive sleep apnea (adult) (pediatric): Secondary | ICD-10-CM | POA: Diagnosis not present

## 2018-11-13 ENCOUNTER — Encounter: Payer: Medicare HMO | Admitting: Internal Medicine

## 2018-11-17 ENCOUNTER — Telehealth: Payer: Self-pay | Admitting: *Deleted

## 2018-11-17 NOTE — Telephone Encounter (Signed)
Copied from [place] [id number]. Topic: Appointment Scheduling - Scheduling Inquiry for Clinic >> Nov 17, 2018 10:48 AM Sheran Luz wrote: Patient's husband is scheduled for flu shot on 9/29- Patient would like to come at same time. No availability to schedule.

## 2018-11-22 ENCOUNTER — Ambulatory Visit (INDEPENDENT_AMBULATORY_CARE_PROVIDER_SITE_OTHER): Payer: Medicare HMO

## 2018-11-22 ENCOUNTER — Other Ambulatory Visit: Payer: Self-pay

## 2018-11-22 DIAGNOSIS — Z23 Encounter for immunization: Secondary | ICD-10-CM

## 2018-12-09 ENCOUNTER — Encounter: Payer: Medicare HMO | Admitting: Internal Medicine

## 2018-12-09 ENCOUNTER — Encounter

## 2018-12-09 ENCOUNTER — Other Ambulatory Visit: Payer: Self-pay

## 2018-12-11 ENCOUNTER — Other Ambulatory Visit: Payer: Self-pay

## 2018-12-11 ENCOUNTER — Ambulatory Visit (INDEPENDENT_AMBULATORY_CARE_PROVIDER_SITE_OTHER): Payer: Medicare HMO | Admitting: Internal Medicine

## 2018-12-11 ENCOUNTER — Encounter: Payer: Self-pay | Admitting: Internal Medicine

## 2018-12-11 VITALS — BP 118/58 | HR 74 | Temp 97.3°F | Resp 14 | Ht 64.0 in | Wt 140.8 lb

## 2018-12-11 DIAGNOSIS — Z23 Encounter for immunization: Secondary | ICD-10-CM | POA: Diagnosis not present

## 2018-12-11 DIAGNOSIS — E041 Nontoxic single thyroid nodule: Secondary | ICD-10-CM | POA: Diagnosis not present

## 2018-12-11 DIAGNOSIS — E7800 Pure hypercholesterolemia, unspecified: Secondary | ICD-10-CM

## 2018-12-11 DIAGNOSIS — Z0000 Encounter for general adult medical examination without abnormal findings: Secondary | ICD-10-CM | POA: Diagnosis not present

## 2018-12-11 DIAGNOSIS — E559 Vitamin D deficiency, unspecified: Secondary | ICD-10-CM | POA: Diagnosis not present

## 2018-12-11 DIAGNOSIS — R5383 Other fatigue: Secondary | ICD-10-CM | POA: Diagnosis not present

## 2018-12-11 DIAGNOSIS — G4733 Obstructive sleep apnea (adult) (pediatric): Secondary | ICD-10-CM | POA: Diagnosis not present

## 2018-12-11 MED ORDER — ZOSTER VAC RECOMB ADJUVANTED 50 MCG/0.5ML IM SUSR: 0.5000 mL | Freq: Once | INTRAMUSCULAR | 1 refills | Status: AC

## 2018-12-11 NOTE — Progress Notes (Signed)
Patient ID: Terri Melendez, female    DOB: Jun 26, 1950  Age: 68 y.o. MRN: [id number]  The patient is here for annual  Preventive  examination and management of other chronic and acute problems.  Colonoscopy at [facility] In  August 2020 no polyps  [facility] thyroid  US for nodules by [email or web address]  PAP SMEAR NORMAL 2018    The risk factors are reflected in the social history.   The roster of all physicians providing medical care to patient - is listed in the Snapshot section of the chart.  Activities of daily living:  The patient is 100% independent in all ADLs: dressing, toileting, feeding as well as independent mobility  Home safety : The patient has smoke detectors in the home. They wear seatbelts.  There are no firearms at home. There is no violence in the home.   There is no risks for hepatitis, STDs or HIV. There is no   history of blood transfusion. They have no travel history to infectious disease endemic areas of the world.  The patient has seen their dentist in the last six month. They have seen their eye doctor in the last year. They admit to slight hearing difficulty with regard to whispered voices and some television programs.  They have deferred audiologic testing in the last year.  They do not  have excessive sun exposure. Discussed the need for sun protection: hats, long sleeves and use of sunscreen if there is significant sun exposure.   Diet: the importance of a healthy diet is discussed. They do have a healthy diet.  The benefits of regular aerobic exercise were discussed. Terri Melendez walks 4 times per week ,  20 minutes.   Depression screen: there are no signs or vegative symptoms of depression- irritability, change in appetite, anhedonia, sadness/tearfullness.  Cognitive assessment: the patient manages all their financial and personal affairs and is actively engaged. They could relate day,date,year and events; recalled 2/3 objects at 3 minutes; performed clock-face test  normally.  The following portions of the patient's history were reviewed and updated as appropriate: allergies, current medications, past family history, past medical history,  past surgical history, past social history  and problem list.  Visual acuity was not assessed per patient preference since Terri Melendez has regular follow up with Terri Melendez ophthalmologist. Hearing and body mass index were assessed and reviewed.   During the course of the visit the patient was educated and counseled about appropriate screening and preventive services including : fall prevention , diabetes screening, nutrition counseling, colorectal cancer screening, and recommended immunizations.    CC: The primary encounter diagnosis was Non-functional thyroid nodule. Diagnoses of Hypercholesteremia, Vitamin D deficiency, Fatigue, unspecified type, Need for 23-polyvalent pneumococcal polysaccharide vaccine, Need for vaccination with 13-polyvalent pneumococcal conjugate vaccine, and Encounter for preventive health examination were also pertinent to this visit.  History Terri Melendez has a past medical history of Cancer ([place]), basal cell carcinoma (2002), Hypertension, Osteopenia, and Sinus congestion.   Terri Melendez has a past surgical history that includes Tonsillectomy and adenoidectomy and Excision basal cell carcinoma.   Terri Melendez family history includes Breast cancer in Terri Melendez maternal aunt; Cancer in Terri Melendez mother.Terri Melendez reports that Terri Melendez has never smoked. Terri Melendez has never used smokeless tobacco. Terri Melendez reports current alcohol use. Terri Melendez reports that Terri Melendez does not use drugs.  Outpatient Medications Prior to Visit  Medication Sig Dispense Refill  . levocetirizine (XYZAL) 5 MG tablet Take 5 mg by mouth every evening.    . mometasone (NASONEX) 50 MCG/ACT  nasal spray Place 2 sprays into the nose daily.    Marland Kitchen telmisartan (MICARDIS) 40 MG tablet Take 1 tablet (40 mg total) by mouth daily. 90 tablet 1  . pantoprazole (PROTONIX) 40 MG tablet Take 40 mg by mouth daily.     .  Ascorbic Acid (VITAMIN C) 1000 MG tablet Take 1,000 mg by mouth daily.    Marland Kitchen omeprazole (PRILOSEC) 40 MG capsule Take 1 capsule (40 mg total) by mouth daily. 30 capsule 12   No facility-administered medications prior to visit.     Review of Systems   Patient denies headache, fevers, malaise, unintentional weight loss, skin rash, eye pain, sinus congestion and sinus pain, sore throat, dysphagia,  hemoptysis , cough, dyspnea, wheezing, chest pain, palpitations, orthopnea, edema, abdominal pain, nausea, melena, diarrhea, constipation, flank pain, dysuria, hematuria, urinary  Frequency, nocturia, numbness, tingling, seizures,  Focal weakness, Loss of consciousness,  Tremor, insomnia, depression, anxiety, and suicidal ideation.      Objective:  BP (!) 118/58 (BP Location: Left Arm, Patient Position: Sitting, Cuff Size: Normal)   Pulse 74   Temp (!) 97.3 F (36.3 C) (Temporal)   Resp 14   Ht 5\' 4"  (1.626 m)   Wt 140 lb 12.8 oz (63.9 kg)   SpO2 98%   BMI 24.17 kg/m   Physical Exam  General appearance: alert, cooperative and appears stated age Ears: normal TM's and external ear canals both ears Throat: lips, mucosa, and tongue normal; teeth and gums normal Neck: no adenopathy, no carotid bruit, supple, symmetrical, trachea midline and thyroid not enlarged, symmetric, no tenderness/mass/nodules Back: symmetric, no curvature. ROM normal. No CVA tenderness. Lungs: clear to auscultation bilaterally Heart: regular rate and rhythm, S1, S2 normal, no murmur, click, rub or gallop Abdomen: soft, non-tender; bowel sounds normal; no masses,  no organomegaly Pulses: 2+ and symmetric Skin: Skin color, texture, turgor normal. No rashes or lesions Lymph nodes: Cervical, supraclavicular, and axillary nodes normal.   Assessment & Plan:   Problem List Items Addressed This Visit      Unprioritized   Hypercholesteremia   Relevant Orders   Lipid panel   Non-functional thyroid nodule - Primary    Relevant Orders   TSH   Encounter for preventive health examination    age appropriate education and counseling updated, referrals for preventative services and immunizations addressed, dietary and smoking counseling addressed, most recent labs reviewed.  I have personally reviewed and have noted:  1) the patient's medical and social history 2) The pt's use of alcohol, tobacco, and illicit drugs 3) The patient's current medications and supplements 4) Functional ability including ADL's, fall risk, home safety risk, hearing and visual impairment 5) Diet and physical activities 6) Evidence for depression or mood disorder 7) The patient's height, weight, and BMI have been recorded in the chart  I have made referrals, and provided counseling and education based on review of the above       Other Visit Diagnoses    Vitamin D deficiency       Relevant Orders   VITAMIN D 25 Hydroxy (Vit-D Deficiency, Fractures)   Fatigue, unspecified type       Relevant Orders   Comprehensive metabolic panel   CBC with Differential/Platelet   Need for 23-polyvalent pneumococcal polysaccharide vaccine       Need for vaccination with 13-polyvalent pneumococcal conjugate vaccine       Relevant Orders   Pneumococcal conjugate vaccine 13-valent IM (Completed)      I have discontinued  Terri Risen "[name]"'s omeprazole and vitamin C. I am also having Terri Melendez start on Zoster Vaccine Adjuvanted. Additionally, I am having Terri Melendez maintain Terri Melendez mometasone, levocetirizine, telmisartan, and pantoprazole.  Meds ordered this encounter  Medications  . Zoster Vaccine Adjuvanted [facility]) injection    Sig: Inject 0.5 mLs into the muscle once for 1 dose.    Dispense:  1 each    Refill:  1    Medications Discontinued During This Encounter  Medication Reason  . omeprazole (PRILOSEC) 40 MG capsule Change in therapy  . Ascorbic Acid (VITAMIN C) 1000 MG tablet Patient has not taken in last 30 days    Follow-up: No  follow-ups on file.   Crecencio Mc, MD

## 2018-12-11 NOTE — Patient Instructions (Addendum)
Please return for fasting labs at your leisure   You received the Pneumonia vaccine today called "pREVNAR"  The ShingRx vaccine is now available in local pharmacIes and is much more protective than the old one  Zostavax  (it is about 97%  Effective in preventing shingles). .   It is therefore ADVISED for all interested adults over 50 to prevent shingles so I have printed you a prescription for it.  (it requires a 2nd dose 2 to 6 months after the first one) .  It will cause you to have flu  like symptoms for 2 days If your pharmacy is not available to give it to you,  The [facility] pharmacy will give it to you.  They are located on the ground floor of the [place] [facility] After Age 85 After age 52, you are at a higher risk for certain long-term diseases and infections as well as injuries from falls. Falls are a major cause of broken bones and head injuries in people who are older than age 21. Getting regular preventive care can help to keep you healthy and well. Preventive care includes getting regular testing and making lifestyle changes as recommended by your health care provider. Talk with your health care provider about:  Which screenings and tests you should have. A screening is a test that checks for a disease when you have no symptoms.  A diet and exercise plan that is right for you. What should I know about screenings and tests to prevent falls? Screening and testing are the best ways to find a health problem early. Early diagnosis and treatment give you the best chance of managing medical conditions that are common after age 66. Certain conditions and lifestyle choices may make you more likely to have a fall. Your health care provider may recommend:  Regular vision checks. Poor vision and conditions such as cataracts can make you more likely to have a fall. If you wear glasses, make sure to get your prescription updated if your vision changes.   Medicine review. Work with your health care provider to regularly review all of the medicines you are taking, including over-the-counter medicines. Ask your health care provider about any side effects that may make you more likely to have a fall. Tell your health care provider if any medicines that you take make you feel dizzy or sleepy.  Osteoporosis screening. Osteoporosis is a condition that causes the bones to get weaker. This can make the bones weak and cause them to break more easily.  Blood pressure screening. Blood pressure changes and medicines to control blood pressure can make you feel dizzy.  Strength and balance checks. Your health care provider may recommend certain tests to check your strength and balance while standing, walking, or changing positions.  Foot health exam. Foot pain and numbness, as well as not wearing proper footwear, can make you more likely to have a fall.  Depression screening. You may be more likely to have a fall if you have a fear of falling, feel emotionally low, or feel unable to do activities that you used to do.  Alcohol use screening. Using too much alcohol can affect your balance and may make you more likely to have a fall. What actions can I take to lower my risk of falls? General instructions  Talk with your health care provider about your risks for falling. Tell your health care provider if: ? You fall. Be sure to tell  your health care provider about all falls, even ones that seem minor. ? You feel dizzy, sleepy, or off-balance.  Take over-the-counter and prescription medicines only as told by your health care provider. These include any supplements.  Eat a healthy diet and maintain a healthy weight. A healthy diet includes low-fat dairy products, low-fat (lean) meats, and fiber from whole grains, beans, and lots of fruits and vegetables. Home safety  Remove any tripping hazards, such as rugs, cords, and clutter.  Install safety equipment such  as grab bars in bathrooms and safety rails on stairs.  Keep rooms and walkways well-lit. Activity   Follow a regular exercise program to stay fit. This will help you maintain your balance. Ask your health care provider what types of exercise are appropriate for you.  If you need a cane or walker, use it as recommended by your health care provider.  Wear supportive shoes that have nonskid soles. Lifestyle  Do not drink alcohol if your health care provider tells you not to drink.  If you drink alcohol, limit how much you have: ? 0-1 drink a day for women. ? 0-2 drinks a day for men.  Be aware of how much alcohol is in your drink. In the [place] one drink equals one typical bottle of beer (12 oz), one-half glass of wine (5 oz), or one shot of hard liquor (1 oz).  Do not use any products that contain nicotine or tobacco, such as cigarettes and e-cigarettes. If you need help quitting, ask your health care provider. Summary  Having a healthy lifestyle and getting preventive care can help to protect your health and wellness after age 69.  Screening and testing are the best way to find a health problem early and help you avoid having a fall. Early diagnosis and treatment give you the best chance for managing medical conditions that are more common for people who are older than age 81.  Falls are a major cause of broken bones and head injuries in people who are older than age 99. Take precautions to prevent a fall at home.  Work with your health care provider to learn what changes you can make to improve your health and wellness and to prevent falls. This information is not intended to replace advice given to you by your health care provider. Make sure you discuss any questions you have with your health care provider. Document Released: 12/26/2016 Document Revised: 06/05/2018 Document Reviewed: 12/26/2016 Elsevier Patient Education  2020 [employer].

## 2018-12-13 DIAGNOSIS — Z0000 Encounter for general adult medical examination without abnormal findings: Secondary | ICD-10-CM | POA: Insufficient documentation

## 2018-12-13 NOTE — Assessment & Plan Note (Signed)

## 2018-12-18 ENCOUNTER — Other Ambulatory Visit: Payer: Self-pay

## 2018-12-18 ENCOUNTER — Other Ambulatory Visit (INDEPENDENT_AMBULATORY_CARE_PROVIDER_SITE_OTHER): Payer: Medicare HMO

## 2018-12-18 DIAGNOSIS — E7800 Pure hypercholesterolemia, unspecified: Secondary | ICD-10-CM | POA: Diagnosis not present

## 2018-12-18 DIAGNOSIS — E041 Nontoxic single thyroid nodule: Secondary | ICD-10-CM

## 2018-12-18 DIAGNOSIS — E559 Vitamin D deficiency, unspecified: Secondary | ICD-10-CM

## 2018-12-18 DIAGNOSIS — R5383 Other fatigue: Secondary | ICD-10-CM

## 2018-12-18 LAB — LIPID PANEL
Cholesterol: 229 mg/dL — ABNORMAL HIGH (ref 0–200)
HDL: 53.3 mg/dL (ref >39.00)
LDL Cholesterol: 163 mg/dL — ABNORMAL HIGH (ref 0–99)
NonHDL: 175.58
Total CHOL/HDL Ratio: 4
Triglycerides: 61 mg/dL (ref 0.0–149.0)
VLDL: 12.2 mg/dL (ref 0.0–40.0)

## 2018-12-18 LAB — CBC WITH DIFFERENTIAL/PLATELET
Basophils Absolute: 0 10*3/uL (ref 0.0–0.1)
Basophils Relative: 0.7 % (ref 0.0–3.0)
Eosinophils Absolute: 0.2 10*3/uL (ref 0.0–0.7)
Eosinophils Relative: 3.6 % (ref 0.0–5.0)
HCT: 35.5 % — ABNORMAL LOW (ref 36.0–46.0)
Hemoglobin: 12 g/dL (ref 12.0–15.0)
Lymphocytes Relative: 28.6 % (ref 12.0–46.0)
Lymphs Abs: 1.9 10*3/uL (ref 0.7–4.0)
MCHC: 33.9 g/dL (ref 30.0–36.0)
MCV: 90 fl (ref 78.0–100.0)
Monocytes Absolute: 0.5 10*3/uL (ref 0.1–1.0)
Monocytes Relative: 8.1 % (ref 3.0–12.0)
Neutro Abs: 3.9 10*3/uL (ref 1.4–7.7)
Neutrophils Relative %: 59 % (ref 43.0–77.0)
Platelets: 242 10*3/uL (ref 150.0–400.0)
RBC: 3.95 Mil/uL (ref 3.87–5.11)
RDW: 14.5 % (ref 11.5–15.5)
WBC: 6.5 10*3/uL (ref 4.0–10.5)

## 2018-12-18 LAB — COMPREHENSIVE METABOLIC PANEL
ALT: 11 U/L (ref 0–35)
AST: 17 U/L (ref 0–37)
Albumin: 4.2 g/dL (ref 3.5–5.2)
Alkaline Phosphatase: 95 U/L (ref 39–117)
BUN: 17 mg/dL (ref 6–23)
CO2: 29 mEq/L (ref 19–32)
Calcium: 9.1 mg/dL (ref 8.4–10.5)
Chloride: 106 mEq/L (ref 96–112)
Creatinine, Ser: 0.73 mg/dL (ref 0.40–1.20)
GFR: 79.7 mL/min (ref >60.00)
Glucose, Bld: 77 mg/dL (ref 70–99)
Potassium: 4.1 mEq/L (ref 3.5–5.1)
Sodium: 142 mEq/L (ref 135–145)
Total Bilirubin: 0.4 mg/dL (ref 0.2–1.2)
Total Protein: 6.2 g/dL (ref 6.0–8.3)

## 2018-12-18 LAB — VITAMIN D 25 HYDROXY (VIT D DEFICIENCY, FRACTURES): VITD: 23.17 ng/mL — ABNORMAL LOW (ref 30.00–100.00)

## 2018-12-18 LAB — TSH: TSH: 1.07 u[IU]/mL (ref 0.35–4.50)

## 2019-01-11 DIAGNOSIS — G4733 Obstructive sleep apnea (adult) (pediatric): Secondary | ICD-10-CM | POA: Diagnosis not present

## 2019-01-19 ENCOUNTER — Other Ambulatory Visit: Payer: Self-pay | Admitting: Internal Medicine

## 2019-01-19 DIAGNOSIS — Z1231 Encounter for screening mammogram for malignant neoplasm of breast: Secondary | ICD-10-CM

## 2019-02-09 DIAGNOSIS — G4733 Obstructive sleep apnea (adult) (pediatric): Secondary | ICD-10-CM | POA: Diagnosis not present

## 2019-02-11 DIAGNOSIS — L820 Inflamed seborrheic keratosis: Secondary | ICD-10-CM | POA: Diagnosis not present

## 2019-02-11 DIAGNOSIS — D229 Melanocytic nevi, unspecified: Secondary | ICD-10-CM | POA: Diagnosis not present

## 2019-02-11 DIAGNOSIS — Z1283 Encounter for screening for malignant neoplasm of skin: Secondary | ICD-10-CM | POA: Diagnosis not present

## 2019-02-11 DIAGNOSIS — L578 Other skin changes due to chronic exposure to nonionizing radiation: Secondary | ICD-10-CM | POA: Diagnosis not present

## 2019-02-11 DIAGNOSIS — Z85828 Personal history of other malignant neoplasm of skin: Secondary | ICD-10-CM | POA: Diagnosis not present

## 2019-02-11 DIAGNOSIS — L821 Other seborrheic keratosis: Secondary | ICD-10-CM | POA: Diagnosis not present

## 2019-02-11 DIAGNOSIS — D225 Melanocytic nevi of trunk: Secondary | ICD-10-CM | POA: Diagnosis not present

## 2019-02-11 DIAGNOSIS — L814 Other melanin hyperpigmentation: Secondary | ICD-10-CM | POA: Diagnosis not present

## 2019-03-12 DIAGNOSIS — G4733 Obstructive sleep apnea (adult) (pediatric): Secondary | ICD-10-CM | POA: Diagnosis not present

## 2019-03-13 ENCOUNTER — Ambulatory Visit
Admission: RE | Admit: 2019-03-13 | Discharge: 2019-03-13 | Disposition: A | Discharge status: Home / Self Care | Payer: Medicare HMO | Source: Ambulatory Visit | Attending: Internal Medicine | Admitting: Internal Medicine

## 2019-03-13 DIAGNOSIS — Z1231 Encounter for screening mammogram for malignant neoplasm of breast: Secondary | ICD-10-CM | POA: Diagnosis not present

## 2019-03-30 MED ORDER — TELMISARTAN 40 MG PO TABS: 40.0000 mg | ORAL_TABLET | Freq: Every day | ORAL | 1 refills | Status: DC

## 2019-04-02 ENCOUNTER — Other Ambulatory Visit: Payer: Medicare HMO

## 2019-09-20 ENCOUNTER — Other Ambulatory Visit: Payer: Self-pay

## 2019-09-20 ENCOUNTER — Encounter: Payer: Self-pay | Admitting: Emergency Medicine

## 2019-09-20 DIAGNOSIS — R82998 Other abnormal findings in urine: Secondary | ICD-10-CM | POA: Diagnosis not present

## 2019-09-20 DIAGNOSIS — N3001 Acute cystitis with hematuria: Secondary | ICD-10-CM | POA: Diagnosis not present

## 2019-09-20 DIAGNOSIS — Z85828 Personal history of other malignant neoplasm of skin: Secondary | ICD-10-CM | POA: Diagnosis not present

## 2019-09-20 DIAGNOSIS — R350 Frequency of micturition: Secondary | ICD-10-CM | POA: Diagnosis present

## 2019-09-20 DIAGNOSIS — I10 Essential (primary) hypertension: Secondary | ICD-10-CM | POA: Insufficient documentation

## 2019-09-20 DIAGNOSIS — Z79899 Other long term (current) drug therapy: Secondary | ICD-10-CM | POA: Insufficient documentation

## 2019-09-20 LAB — URINALYSIS, COMPLETE (UACMP) WITH MICROSCOPIC
Bilirubin Urine: NEGATIVE
Glucose, UA: NEGATIVE mg/dL
Ketones, ur: NEGATIVE mg/dL
Nitrite: NEGATIVE
Protein, ur: NEGATIVE mg/dL
Specific Gravity, Urine: 1.002 — ABNORMAL LOW (ref 1.005–1.030)
Squamous Epithelial / HPF: NONE SEEN (ref 0–5)
WBC, UA: >50 WBC/hpf — ABNORMAL HIGH (ref 0–5)
pH: 7 (ref 5.0–8.0)

## 2019-09-20 LAB — COMPREHENSIVE METABOLIC PANEL
ALT: 13 U/L (ref 0–44)
AST: 18 U/L (ref 15–41)
Albumin: 4.3 g/dL (ref 3.5–5.0)
Alkaline Phosphatase: 95 U/L (ref 38–126)
Anion gap: 8 (ref 5–15)
BUN: 19 mg/dL (ref 8–23)
CO2: 26 mmol/L (ref 22–32)
Calcium: 9.4 mg/dL (ref 8.9–10.3)
Chloride: 107 mmol/L (ref 98–111)
Creatinine, Ser: 0.85 mg/dL (ref 0.44–1.00)
GFR calc Af Amer: >60 mL/min (ref >60)
GFR calc non Af Amer: >60 mL/min (ref >60)
Glucose, Bld: 103 mg/dL — ABNORMAL HIGH (ref 70–99)
Potassium: 4.2 mmol/L (ref 3.5–5.1)
Sodium: 141 mmol/L (ref 135–145)
Total Bilirubin: 0.9 mg/dL (ref 0.3–1.2)
Total Protein: 6.8 g/dL (ref 6.5–8.1)

## 2019-09-20 LAB — CBC
HCT: 36.6 % (ref 36.0–46.0)
Hemoglobin: 11.9 g/dL — ABNORMAL LOW (ref 12.0–15.0)
MCH: 29.5 pg (ref 26.0–34.0)
MCHC: 32.5 g/dL (ref 30.0–36.0)
MCV: 90.8 fL (ref 80.0–100.0)
Platelets: 233 10*3/uL (ref 150–400)
RBC: 4.03 MIL/uL (ref 3.87–5.11)
RDW: 12.9 % (ref 11.5–15.5)
WBC: 11.6 10*3/uL — ABNORMAL HIGH (ref 4.0–10.5)
nRBC: 0 % (ref 0.0–0.2)

## 2019-09-20 LAB — LIPASE, BLOOD: Lipase: 17 U/L (ref 11–51)

## 2019-09-20 NOTE — ED Triage Notes (Addendum)
Patient with complaint of urinary frequency and pain with urination that started about 16:30 today.  Patient also states lower abdominal pressure.

## 2019-09-21 ENCOUNTER — Emergency Department
Admission: EM | Admit: 2019-09-21 | Discharge: 2019-09-21 | Disposition: A | Discharge status: Home / Self Care | Payer: Medicare HMO | Attending: Emergency Medicine | Admitting: Emergency Medicine

## 2019-09-21 DIAGNOSIS — N3001 Acute cystitis with hematuria: Secondary | ICD-10-CM

## 2019-09-21 MED ORDER — CEPHALEXIN 500 MG PO CAPS: 500.0000 mg | ORAL_CAPSULE | Freq: Three times a day (TID) | ORAL | 0 refills | Status: AC

## 2019-09-21 NOTE — ED Notes (Signed)
Patient discharged home with husband, patient received discharge papers and prescription for antibiotic. Patient appropriate and cooperative. Vital signs taken. NAD noted.

## 2019-09-21 NOTE — ED Notes (Signed)
Patient speaking with Dr. Tamala Julian and myself, patient stated just got back from beach and having symptoms of pain, and pressure of the feeling she has to urinate and she felt she had to go constantly. Denies fever, vomiting. When wiped herself last night saw a pink tinge. No back pain.

## 2019-09-21 NOTE — ED Provider Notes (Signed)
[facility] Emergency Department Provider Note  ____________________________________________   First MD Initiated Contact with Patient 09/21/19 0730     (approximate)  I have reviewed the triage vital signs and the nursing notes.   HISTORY   Chief Complaint Dysuria and Abdominal Pain   HPI Terri Melendez is a 69 y.o. female past medical history of hypertension and GERD who presents for assessment of approximately 2 days of increased urinary frequency associated with some dark-colored urine.  Patient also states she has some burning after she finishes peeing.  She denies any history of urinary tract infections or recent prior similar symptoms.  There is no clear alleviating or aggravating factors.  Patient denies any fevers, chills, cough, chest pain, abdominal pain, back pain, vaginal bleeding, rash, or other acute symptoms.  She does endorse some suprapubic pressure but states this is relieved after going to the bathroom.  No acute concerns at this time.         Past Medical History:  Diagnosis Date  . Cancer ([place])    basal cell on face in 2000 or 2001  . Hx of basal cell carcinoma 2002   right cheek   . Hypertension   . Osteopenia   . Sinus congestion     Patient Active Problem List   Diagnosis Date Noted  . Encounter for preventive health examination 12/13/2018  . History of shingles 05/04/2018  . Non-functional thyroid nodule 05/04/2018  . Essential hypertension 10/22/2017  . Advanced care planning/counseling discussion 10/22/2017  . Hypercholesteremia 05/14/2016  . OSA on CPAP 10/13/2014    Past Surgical History:  Procedure Laterality Date  . BASAL CELL CARCINOMA EXCISION    . TONSILLECTOMY AND ADENOIDECTOMY      Prior to Admission medications   Medication Sig Start Date End Date Taking? Authorizing Provider  cephALEXin (KEFLEX) 500 MG capsule Take 1 capsule (500 mg total) by mouth 3 (three) times daily for 7 days. 09/21/19  09/28/19  Lucrezia Starch, MD  levocetirizine (XYZAL) 5 MG tablet Take 5 mg by mouth every evening.    [provider]  mometasone (NASONEX) 50 MCG/ACT nasal spray Place 2 sprays into the nose daily.    [provider]  pantoprazole (PROTONIX) 40 MG tablet Take 40 mg by mouth daily.  10/20/18   [provider]  telmisartan (MICARDIS) 40 MG tablet Take 1 tablet (40 mg total) by mouth daily. 03/30/19   Crecencio Mc, MD    Allergies Dust mite extract  Family History  Problem Relation Age of Onset  . Cancer Mother        lung  . Breast cancer Maternal Aunt     Social History Social History   Tobacco Use  . Smoking status: Never Smoker  . Smokeless tobacco: Never Used  Vaping Use  . Vaping Use: Never used  Substance Use Topics  . Alcohol use: Yes    Comment: on occasion  . Drug use: No    Review of Systems  Review of Systems  Constitutional: Negative for chills and fever.  Respiratory: Negative for cough.   Cardiovascular: Negative for chest pain.  Gastrointestinal: Positive for abdominal pain ( pressure). Negative for blood in stool, diarrhea, nausea and vomiting.  Genitourinary: Positive for dysuria, frequency and hematuria. Negative for flank pain.  Musculoskeletal: Negative for myalgias.  Neurological: Negative for headaches.  All other systems reviewed and are negative.    ____________________________________________   PHYSICAL EXAM:    VITAL SIGNS:  ED Triage Vitals  Enc Vitals Group     BP 09/20/19 2311 (!) 172/88     Pulse Rate 09/20/19 2311 88     Resp 09/20/19 2311 18     Temp 09/20/19 2311 97.7 F (36.5 C)     Temp Source 09/20/19 2311 Oral     SpO2 09/20/19 2311 100 %     Weight 09/20/19 2312 140 lb (63.5 kg)     Height 09/20/19 2312 5\' 4"  (1.626 m)     Head Circumference --      Peak Flow --      Pain Score 09/20/19 2311 8     Pain Loc --      Pain Edu? --      Excl. in [place]? --    Constitutional: Alert and  oriented. Well appearing and in no acute distress. Eyes: Conjunctivae are normal.  Head: Atraumatic. Nose: No congestion/rhinnorhea. Mouth/Throat: Mucous membranes are moist.  Oropharynx non-erythematous. Neck: No stridor.   Cardiovascular: Normal rate, regular rhythm. Grossly normal heart sounds.  Good peripheral circulation. Respiratory: Normal respiratory effort.  No retractions. Lungs CTAB. Gastrointestinal: Soft and nontender. No distention.  No CVA tenderness. Genitourinary: Deferred Musculoskeletal: No lower extremity tenderness nor edema.  No joint effusions. Neurologic:  Normal speech and language. No gross focal neurologic deficits are appreciated. No gait instability. Skin:  Skin is warm, dry and intact. No rash noted. Psychiatric: Mood and affect are normal. Speech and behavior are normal.  ____________________________________________   LABS (all labs ordered are listed, but only abnormal results are displayed)  Labs Reviewed  COMPREHENSIVE METABOLIC PANEL - Abnormal; Notable for the following components:      Result Value   Glucose, Bld 103 (*)    All other components within normal limits  CBC - Abnormal; Notable for the following components:   WBC 11.6 (*)    Hemoglobin 11.9 (*)    All other components within normal limits  URINALYSIS, COMPLETE (UACMP) WITH MICROSCOPIC - Abnormal; Notable for the following components:   Color, Urine STRAW (*)    APPearance HAZY (*)    Specific Gravity, Urine 1.002 (*)    Hgb urine dipstick LARGE (*)    Leukocytes,Ua LARGE (*)    WBC, UA >50 (*)    Bacteria, UA RARE (*)    All other components within normal limits  URINE CULTURE  LIPASE, BLOOD   ____________________________________________   ____________________________________________  RADIOLOGY  ED MD interpretation:    Official radiology report(s): No results found.  ____________________________________________   PROCEDURES  Procedure(s) performed (including  Critical Care):  Procedures   ____________________________________________   INITIAL IMPRESSION / ASSESSMENT AND PLAN / ED COURSE  @[email or web address] @       Patient presents for assessment of suprapubic fullness associated with urinary frequency and some dark red-colored urine.  Patient is afebrile and hemodynamically stable on arrival although she is noted to have slightly elevated blood pressure.  Patient states she usually takes her blood pressure medications in the evening and slept very poorly last night and we discussed having her have this rechecked by her PCP in 1 week.    Overall patient's exam and labs specifically UA remarkable for large hemoglobin and leukocyte esterase with WBCs greater than 50 is concerning for cystitis with some hematuria.  Low suspicion for pyelonephritis given absence of back pain or fever on history and no CVA tenderness on exam.  No nausea, vomiting, diarrhea or abdominal pain to suggest SBO, acute pancreatitis, diverticulitis.  Urine culture sent.  We will plan to treat patient with 7 days of Keflex.  Discussed return precautions the patient including any development of fevers, back pain, nausea, vomiting, or other acute symptoms.     ____________________________________________   FINAL CLINICAL IMPRESSION(S) / ED DIAGNOSES  Final diagnoses:  Acute cystitis with hematuria   ED Discharge Orders         Ordered    cephALEXin (KEFLEX) 500 MG capsule  3 times daily     Discontinue  Reprint     09/21/19 0738         Note:  This document was prepared using Dragon voice recognition software and may include unintentional dictation errors.   Lucrezia Starch, MD 09/21/19 [phone]

## 2019-09-22 MED ORDER — TELMISARTAN 40 MG PO TABS: 40.0000 mg | ORAL_TABLET | Freq: Every day | ORAL | 0 refills | Status: DC

## 2019-09-23 LAB — URINE CULTURE: Culture: >=100000 — AB

## 2019-10-01 ENCOUNTER — Ambulatory Visit (INDEPENDENT_AMBULATORY_CARE_PROVIDER_SITE_OTHER): Payer: Medicare HMO

## 2019-10-01 VITALS — Ht 64.0 in | Wt 140.0 lb

## 2019-10-01 DIAGNOSIS — Z0000 Encounter for general adult medical examination without abnormal findings: Secondary | ICD-10-CM

## 2019-10-01 NOTE — Progress Notes (Addendum)
Subjective:   Terri Melendez is a 69 y.o. female who presents for Medicare Annual (Subsequent) preventive examination.  Review of Systems    No ROS.  Medicare Wellness Virtual Visit.   Cardiac Risk Factors include: advanced age (>42men, >59 women);hypertension     Objective:    Today's Vitals   10/01/19 1038  Weight: 140 lb (63.5 kg)  Height: 5\' 4"  (1.626 m)   Body mass index is 24.03 kg/m.  Advanced Directives 10/01/2019 09/20/2019 09/30/2018  Does Patient Have a Medical Advance Directive? Yes Yes Yes  Type of Paramedic of [place];Living will - [place];Living will  Does patient want to make changes to medical advance directive? No - Patient declined - No - Patient declined  Copy of [place] in Chart? Yes - validated most recent copy scanned in chart (See row information) - No - copy requested    Current Medications (verified) Outpatient Encounter Medications as of 10/01/2019  Medication Sig  . levocetirizine (XYZAL) 5 MG tablet Take 5 mg by mouth every evening.  . mometasone (NASONEX) 50 MCG/ACT nasal spray Place 2 sprays into the nose daily.  Marland Kitchen omeprazole (PRILOSEC) 40 MG capsule Take 40 mg by mouth daily.  Marland Kitchen telmisartan (MICARDIS) 40 MG tablet Take 1 tablet (40 mg total) by mouth daily.  . [DISCONTINUED] pantoprazole (PROTONIX) 40 MG tablet Take 40 mg by mouth daily.    No facility-administered encounter medications on file as of 10/01/2019.    Allergies (verified) Dust mite extract   History: Past Medical History:  Diagnosis Date  . Cancer ([place])    basal cell on face in 2000 or 2001  . Hx of basal cell carcinoma 2002   right cheek   . Hypertension   . Osteopenia   . Sinus congestion    Past Surgical History:  Procedure Laterality Date  . BASAL CELL CARCINOMA EXCISION    . TONSILLECTOMY AND ADENOIDECTOMY     Family History  Problem Relation Age of Onset  . Cancer Mother        lung  .  Breast cancer Maternal Aunt    Social History   Socioeconomic History  . Marital status: Married    Spouse name: Not on file  . Number of children: Not on file  . Years of education: Not on file  . Highest education level: Not on file  Occupational History  . Not on file  Tobacco Use  . Smoking status: Never Smoker  . Smokeless tobacco: Never Used  Vaping Use  . Vaping Use: Never used  Substance and Sexual Activity  . Alcohol use: Yes    Comment: on occasion  . Drug use: No  . Sexual activity: Not on file  Other Topics Concern  . Not on file  Social History Narrative  . Not on file   Social Determinants of Health   Financial Resource Strain: Low Risk   . Difficulty of Paying Living Expenses: Not hard at all  Food Insecurity: No Food Insecurity  . Worried About Charity fundraiser in the Last Year: Never true  . Ran Out of Food in the Last Year: Never true  Transportation Needs: No Transportation Needs  . Lack of Transportation (Medical): No  . Lack of Transportation (Non-Medical): No  Physical Activity:   . Days of Exercise per Week:   . Minutes of Exercise per Session:   Stress: No Stress Concern Present  . Feeling of Stress :  Not at all  Social Connections:   . Frequency of Communication with Friends and Family:   . Frequency of Social Gatherings with Friends and Family:   . Attends Religious Services:   . Active Member of Clubs or Organizations:   . Attends Archivist Meetings:   Marland Kitchen Marital Status:     Tobacco Counseling Counseling given: Not Answered   Clinical Intake:  Pre-visit preparation completed: Yes        Diabetes: No  How often do you need to have someone help you when you read instructions, pamphlets, or other written materials from your doctor or pharmacy?: 1 - Never   Interpreter Needed?: No      Activities of Daily Living In your present state of health, do you have any difficulty performing the following activities:  10/01/2019  Hearing? N  Vision? N  Difficulty concentrating or making decisions? N  Walking or climbing stairs? N  Dressing or bathing? N  Doing errands, shopping? N  Preparing Food and eating ? N  Using the Toilet? N  In the past six months, have you accidently leaked urine? N  Do you have problems with loss of bowel control? N  Managing your Medications? N  Managing your Finances? N  Housekeeping or managing your Housekeeping? N  Some recent data might be hidden    Patient Care Team: Crecencio Mc, MD as PCP - General (Internal Medicine)  Indicate any recent Medical Services you may have received from other than [facility] providers in the past year (date may be approximate).     Assessment:   This is a routine wellness examination for Terri Melendez.  I connected with [employer] today by telephone and verified that I am speaking with the correct person using two identifiers. Location patient: home Location provider: work Persons participating in the virtual visit: patient, Marine scientist.    I discussed the limitations, risks, security and privacy concerns of performing an evaluation and management service by telephone and the availability of in person appointments. The patient expressed understanding and verbally consented to this telephonic visit.    Interactive audio and video telecommunications were attempted between this provider and patient, however failed, due to patient having technical difficulties OR patient did not have access to video capability.  We continued and completed visit with audio only.  Some vital signs may be absent or patient reported.   Hearing/Vision screen  Hearing Screening   125Hz  250Hz  500Hz  1000Hz  2000Hz  3000Hz  4000Hz  6000Hz  8000Hz   Right ear:           Left ear:           Comments: Patient is able to hear conversational tones without difficulty.  No issues reported.  Vision Screening Comments: Followed by  Wears corrective lenses Cataract extraction,  bilateral Visual acuity not assessed, virtual visit.  They have seen their ophthalmologist in the last 12 months.    Dietary issues and exercise activities discussed: Current Exercise Habits: Home exercise routine, Type of exercise: walking, Time (Minutes): 30, Frequency (Times/Week): 7, Weekly Exercise (Minutes/Week): 210, Intensity: Mild  Goals    . Follow up with Primary Care Provider     As needed      Depression Screen PHQ 2/9 Scores 10/01/2019 09/30/2018 10/22/2017 10/17/2015  PHQ - 2 Score 0 0 0 0  PHQ- 9 Score - - 1 0    Fall Risk Fall Risk  10/01/2019 12/11/2018 09/30/2018 10/22/2017 05/14/2016  Falls in the past year? 0 0 0 No  No  Number falls in past yr: 0 - - - -  Follow up Falls evaluation completed Falls evaluation completed - - -   Handrails in use when climbing stairs? Yes  Home free of loose throw rugs in walkways, pet beds, electrical cords, etc? Yes  Adequate lighting in your home to reduce risk of falls? Yes   ASSISTIVE DEVICES UTILIZED TO PREVENT FALLS: Life alert? No  Use of a cane, walker or w/c? No  Grab bars in the bathroom? No  Shower chair or bench in shower? No  Elevated toilet seat or a handicapped toilet? No   TIMED UP AND GO:  Was the test performed? No . Virtual visit.   Cognitive Function: MMSE - Mini Mental State Exam 10/01/2019  Not completed: Unable to complete     6CIT Screen 09/30/2018  What Year? 0 points  What month? 0 points  What time? 0 points  Count back from 20 0 points  Months in reverse 0 points  Repeat phrase 0 points  Total Score 0    Immunizations Immunization History  Administered Date(s) Administered  . Fluad Quad(high Dose 65+) 11/22/2018  . Influenza Split 01/13/2016  . Influenza-Unspecified 12/13/2016, 12/31/2017  . PFIZER SARS-COV-2 Vaccination 04/10/2019, 05/05/2019  . Pneumococcal Conjugate-13 12/11/2018  . Td 06/25/2005  . Tdap 10/17/2015  . Zoster 09/06/2010     Health Maintenance Health Maintenance   Topic Date Due  . INFLUENZA VACCINE  12/14/2019 (Originally 09/27/2019)  . PNA vac Low Risk Adult (2 of 2 - PPSV23) 12/11/2019  . MAMMOGRAM  03/12/2021  . TETANUS/TDAP  10/16/2025  . COLONOSCOPY  10/08/2028  . DEXA SCAN  Completed  . COVID-19 Vaccine  Completed  . Hepatitis C Screening  Completed   Dental Screening: Recommended annual dental exams for proper oral hygiene  Community Resource Referral / Chronic Care Management: CRR required this visit?  No   CCM required this visit?  No      Plan:   Keep all routine maintenance appointments.   Cpe 12/14/19 @ 8:00; fasting  I have personally reviewed and noted the following in the patient's chart:   . Medical and social history . Use of alcohol, tobacco or illicit drugs  . Current medications and supplements . Functional ability and status . Nutritional status . Physical activity . Advanced directives . List of other physicians . Hospitalizations, surgeries, and ER visits in previous 12 months . Vitals . Screenings to include cognitive, depression, and falls . Referrals and appointments  In addition, I have reviewed and discussed with patient certain preventive protocols, quality metrics, and best practice recommendations. A written personalized care plan for preventive services as well as general preventive health recommendations were provided to patient via mychart.     OBrien-Blaney,  L, LPN   03/03/1094      I have reviewed the above information and agree with above.   Deborra Medina, MD

## 2019-10-01 NOTE — Patient Instructions (Addendum)
[name] , Thank you for taking time to come for your Medicare Wellness Visit. I appreciate your ongoing commitment to your health goals. Please review the following plan we discussed and let me know if I can assist you in the future.   These are the goals we discussed: Goals    . Follow up with Primary Care Provider     As needed       This is a list of the screening recommended for you and due dates:  Health Maintenance  Topic Date Due  . Flu Shot  12/14/2019*  . Pneumonia vaccines (2 of 2 - PPSV23) 12/11/2019  . Mammogram  03/12/2021  . Tetanus Vaccine  10/16/2025  . Colon Cancer Screening  10/08/2028  . DEXA scan (bone density measurement)  Completed  . COVID-19 Vaccine  Completed  .  Hepatitis C: One time screening is recommended by Center for Disease Control  (CDC) for  adults born from [birth year] through 1963.   Completed  *Topic was postponed. The date shown is not the original due date.    Immunizations Immunization History  Administered Date(s) Administered  . Fluad Quad(high Dose 65+) 11/22/2018  . Influenza Split 01/13/2016  . Influenza-Unspecified 12/13/2016, 12/31/2017  . Pneumococcal Conjugate-13 12/11/2018  . Td 06/25/2005  . Tdap 10/17/2015  . Zoster 09/06/2010    Keep all routine maintenance appointments.   Cpe 12/14/19 @ 8:00; fasting  Advanced directives: yes on file  Conditions/risks identified: none new  Follow up in one year for your annual wellness visit    Preventive Care 65 Years and Older, Female Preventive care refers to lifestyle choices and visits with your health care provider that can promote health and wellness. What does preventive care include?  A yearly physical exam. This is also called an annual well check.  Dental exams once or twice a year.  Routine eye exams. Ask your health care provider how often you should have your eyes checked.  Personal lifestyle choices, including:  Daily care of your teeth and  gums.  Regular physical activity.  Eating a healthy diet.  Avoiding tobacco and drug use.  Limiting alcohol use.  Practicing safe sex.  Taking low-dose aspirin every day.  Taking vitamin and mineral supplements as recommended by your health care provider. What happens during an annual well check? The services and screenings done by your health care provider during your annual well check will depend on your age, overall health, lifestyle risk factors, and family history of disease. Counseling  Your health care provider may ask you questions about your:  Alcohol use.  Tobacco use.  Drug use.  Emotional well-being.  Home and relationship well-being.  Sexual activity.  Eating habits.  History of falls.  Memory and ability to understand (cognition).  Work and work Statistician.  Reproductive health. Screening  You may have the following tests or measurements:  Height, weight, and BMI.  Blood pressure.  Lipid and cholesterol levels. These may be checked every 5 years, or more frequently if you are over 59 years old.  Skin check.  Lung cancer screening. You may have this screening every year starting at age 5 if you have a 30-pack-year history of smoking and currently smoke or have quit within the past 15 years.  Fecal occult blood test (FOBT) of the stool. You may have this test every year starting at age 34.  Flexible sigmoidoscopy or colonoscopy. You may have a sigmoidoscopy every 5 years or a colonoscopy every 10 years  starting at age 39.  Hepatitis C blood test.  Hepatitis B blood test.  Sexually transmitted disease (STD) testing.  Diabetes screening. This is done by checking your blood sugar (glucose) after you have not eaten for a while (fasting). You may have this done every 1-3 years.  Bone density scan. This is done to screen for osteoporosis. You may have this done starting at age 55.  Mammogram. This may be done every 1-2 years. Talk to your  health care provider about how often you should have regular mammograms. Talk with your health care provider about your test results, treatment options, and if necessary, the need for more tests. Vaccines  Your health care provider may recommend certain vaccines, such as:  Influenza vaccine. This is recommended every year.  Tetanus, diphtheria, and acellular pertussis (Tdap, Td) vaccine. You may need a Td booster every 10 years.  Zoster vaccine. You may need this after age 16.  Pneumococcal 13-valent conjugate (PCV13) vaccine. One dose is recommended after age 25.  Pneumococcal polysaccharide (PPSV23) vaccine. One dose is recommended after age 40. Talk to your health care provider about which screenings and vaccines you need and how often you need them. This information is not intended to replace advice given to you by your health care provider. Make sure you discuss any questions you have with your health care provider. Document Released: 03/11/2015 Document Revised: 11/02/2015 Document Reviewed: 12/14/2014 Elsevier Interactive Patient Education  2017 [place] Prevention in the Home Falls can cause injuries. They can happen to people of all ages. There are many things you can do to make your home safe and to help prevent falls. What can I do on the outside of my home?  Regularly fix the edges of walkways and driveways and fix any cracks.  Remove anything that might make you trip as you walk through a door, such as a raised step or threshold.  Trim any bushes or trees on the path to your home.  Use bright outdoor lighting.  Clear any walking paths of anything that might make someone trip, such as rocks or tools.  Regularly check to see if handrails are loose or broken. Make sure that both sides of any steps have handrails.  Any raised decks and porches should have guardrails on the edges.  Have any leaves, snow, or ice cleared regularly.  Use sand or salt on walking  paths during winter.  Clean up any spills in your garage right away. This includes oil or grease spills. What can I do in the bathroom?  Use night lights.  Install grab bars by the toilet and in the tub and shower. Do not use towel bars as grab bars.  Use non-skid mats or decals in the tub or shower.  If you need to sit down in the shower, use a plastic, non-slip stool.  Keep the floor dry. Clean up any water that spills on the floor as soon as it happens.  Remove soap buildup in the tub or shower regularly.  Attach bath mats securely with double-sided non-slip rug tape.  Do not have throw rugs and other things on the floor that can make you trip. What can I do in the bedroom?  Use night lights.  Make sure that you have a light by your bed that is easy to reach.  Do not use any sheets or blankets that are too big for your bed. They should not hang down onto the floor.  Have a  firm chair that has side arms. You can use this for support while you get dressed.  Do not have throw rugs and other things on the floor that can make you trip. What can I do in the kitchen?  Clean up any spills right away.  Avoid walking on wet floors.  Keep items that you use a lot in easy-to-reach places.  If you need to reach something above you, use a strong step stool that has a grab bar.  Keep electrical cords out of the way.  Do not use floor polish or wax that makes floors slippery. If you must use wax, use non-skid floor wax.  Do not have throw rugs and other things on the floor that can make you trip. What can I do with my stairs?  Do not leave any items on the stairs.  Make sure that there are handrails on both sides of the stairs and use them. Fix handrails that are broken or loose. Make sure that handrails are as long as the stairways.  Check any carpeting to make sure that it is firmly attached to the stairs. Fix any carpet that is loose or worn.  Avoid having throw rugs at  the top or bottom of the stairs. If you do have throw rugs, attach them to the floor with carpet tape.  Make sure that you have a light switch at the top of the stairs and the bottom of the stairs. If you do not have them, ask someone to add them for you. What else can I do to help prevent falls?  Wear shoes that:  Do not have high heels.  Have rubber bottoms.  Are comfortable and fit you well.  Are closed at the toe. Do not wear sandals.  If you use a stepladder:  Make sure that it is fully opened. Do not climb a closed stepladder.  Make sure that both sides of the stepladder are locked into place.  Ask someone to hold it for you, if possible.  Clearly mark and make sure that you can see:  Any grab bars or handrails.  First and last steps.  Where the edge of each step is.  Use tools that help you move around (mobility aids) if they are needed. These include:  Canes.  Walkers.  Scooters.  Crutches.  Turn on the lights when you go into a dark area. Replace any light bulbs as soon as they burn out.  Set up your furniture so you have a clear path. Avoid moving your furniture around.  If any of your floors are uneven, fix them.  If there are any pets around you, be aware of where they are.  Review your medicines with your doctor. Some medicines can make you feel dizzy. This can increase your chance of falling. Ask your doctor what other things that you can do to help prevent falls. This information is not intended to replace advice given to you by your health care provider. Make sure you discuss any questions you have with your health care provider. Document Released: 12/09/2008 Document Revised: 07/21/2015 Document Reviewed: 03/19/2014 Elsevier Interactive Patient Education  2017 [employer].

## 2019-10-14 ENCOUNTER — Ambulatory Visit: Payer: Medicare HMO | Admitting: Dermatology

## 2019-10-14 ENCOUNTER — Other Ambulatory Visit: Payer: Self-pay

## 2019-10-14 DIAGNOSIS — I781 Nevus, non-neoplastic: Secondary | ICD-10-CM | POA: Diagnosis not present

## 2019-10-14 NOTE — Progress Notes (Signed)
   Follow-Up Visit   Subjective  Terri Melendez is a 69 y.o. female who presents for the following: check spot (forehead, 57m, no symptoms).  The following portions of the chart were reviewed this encounter and updated as appropriate:  Tobacco  Allergies  Meds  Problems  Med Hx  Surg Hx  Fam Hx     Review of Systems:  No other skin or systemic complaints except as noted in HPI or Assessment and Plan.  Objective  Well appearing patient in no apparent distress; mood and affect are within normal limits.  A focused examination was performed including face. Relevant physical exam findings are noted in the Assessment and Plan.  Objective  Head - Anterior (Face): Blanching pink red patch,  blanching with diascopy   Images       Assessment & Plan  Telangiectasia Head - Anterior (Face) Benign-appearing.  Observation.  Call clinic for new or changing moles.  Recommend daily use of broad spectrum spf 30+ sunscreen to sun-exposed areas.   Discussed BBL, $200 to treat that one spot  Return for As scheduled for TBSE.  I, Othelia Pulling, RMA, am acting as scribe for Sarina Ser, MD .  Documentation: I have reviewed the above documentation for accuracy and completeness, and I agree with the above.  Sarina Ser, MD

## 2019-10-20 ENCOUNTER — Encounter: Payer: Self-pay | Admitting: Dermatology

## 2019-12-14 ENCOUNTER — Encounter: Payer: Self-pay | Admitting: Internal Medicine

## 2019-12-14 ENCOUNTER — Other Ambulatory Visit: Payer: Self-pay

## 2019-12-14 ENCOUNTER — Ambulatory Visit: Payer: Medicare HMO

## 2019-12-14 ENCOUNTER — Ambulatory Visit: Payer: Medicare HMO | Admitting: Internal Medicine

## 2019-12-14 VITALS — BP 148/72 | HR 56 | Temp 98.5°F | Resp 15 | Ht 64.0 in | Wt 140.6 lb

## 2019-12-14 DIAGNOSIS — I700 Atherosclerosis of aorta: Secondary | ICD-10-CM | POA: Diagnosis not present

## 2019-12-14 DIAGNOSIS — Z780 Asymptomatic menopausal state: Secondary | ICD-10-CM

## 2019-12-14 DIAGNOSIS — I10 Essential (primary) hypertension: Secondary | ICD-10-CM

## 2019-12-14 DIAGNOSIS — E7800 Pure hypercholesterolemia, unspecified: Secondary | ICD-10-CM | POA: Diagnosis not present

## 2019-12-14 DIAGNOSIS — M2550 Pain in unspecified joint: Secondary | ICD-10-CM

## 2019-12-14 DIAGNOSIS — M25551 Pain in right hip: Secondary | ICD-10-CM

## 2019-12-14 DIAGNOSIS — Z0000 Encounter for general adult medical examination without abnormal findings: Secondary | ICD-10-CM

## 2019-12-14 DIAGNOSIS — M8580 Other specified disorders of bone density and structure, unspecified site: Secondary | ICD-10-CM

## 2019-12-14 DIAGNOSIS — Z1231 Encounter for screening mammogram for malignant neoplasm of breast: Secondary | ICD-10-CM

## 2019-12-14 DIAGNOSIS — G8929 Other chronic pain: Secondary | ICD-10-CM | POA: Diagnosis not present

## 2019-12-14 DIAGNOSIS — I2584 Coronary atherosclerosis due to calcified coronary lesion: Secondary | ICD-10-CM

## 2019-12-14 LAB — COMPREHENSIVE METABOLIC PANEL
ALT: 12 U/L (ref 0–35)
AST: 16 U/L (ref 0–37)
Albumin: 4.3 g/dL (ref 3.5–5.2)
Alkaline Phosphatase: 95 U/L (ref 39–117)
BUN: 22 mg/dL (ref 6–23)
CO2: 28 mEq/L (ref 19–32)
Calcium: 9.2 mg/dL (ref 8.4–10.5)
Chloride: 105 mEq/L (ref 96–112)
Creatinine, Ser: 0.69 mg/dL (ref 0.40–1.20)
GFR: 89.92 mL/min (ref >60.00)
Glucose, Bld: 86 mg/dL (ref 70–99)
Potassium: 4.5 mEq/L (ref 3.5–5.1)
Sodium: 139 mEq/L (ref 135–145)
Total Bilirubin: 0.4 mg/dL (ref 0.2–1.2)
Total Protein: 6.5 g/dL (ref 6.0–8.3)

## 2019-12-14 LAB — TSH: TSH: 0.99 u[IU]/mL (ref 0.35–4.50)

## 2019-12-14 LAB — LIPID PANEL
Cholesterol: 249 mg/dL — ABNORMAL HIGH (ref 0–200)
HDL: 62.2 mg/dL (ref >39.00)
LDL Cholesterol: 173 mg/dL — ABNORMAL HIGH (ref 0–99)
NonHDL: 186.83
Total CHOL/HDL Ratio: 4
Triglycerides: 71 mg/dL (ref 0.0–149.0)
VLDL: 14.2 mg/dL (ref 0.0–40.0)

## 2019-12-14 LAB — MICROALBUMIN / CREATININE URINE RATIO
Creatinine,U: 41.5 mg/dL
Microalb Creat Ratio: 1.7 mg/g (ref 0.0–30.0)
Microalb, Ur: <0.7 mg/dL (ref 0.0–1.9)

## 2019-12-14 LAB — SEDIMENTATION RATE: Sed Rate: 2 mm/hr (ref 0–30)

## 2019-12-14 LAB — C-REACTIVE PROTEIN: CRP: <1 mg/dL (ref 0.5–20.0)

## 2019-12-14 NOTE — Progress Notes (Signed)
Patient ID: Terri Melendez, female    DOB: 1951/01/08  Age: 69 y.o. MRN: [id number]  The patient is here for annual preventive  examination and management of other chronic and acute problems.   The risk factors are reflected in the social history.  The roster of all physicians providing medical care to patient - is listed in the Snapshot section of the chart.  Activities of daily living:  The patient is 100% independent in all ADLs: dressing, toileting, feeding as well as independent mobility  Home safety : The patient has smoke detectors in the home. They wear seatbelts.  There are no firearms at home. There is no violence in the home.   There is no risks for hepatitis, STDs or HIV. There is no   history of blood transfusion. They have no travel history to infectious disease endemic areas of the world.  The patient has seen their dentist in the last six month. They have seen their eye doctor in the last year. They admit to slight hearing difficulty with regard to whispered voices and some television programs.  They have deferred audiologic testing in the last year.  They do not  have excessive sun exposure. Discussed the need for sun protection: hats, long sleeves and use of sunscreen if there is significant sun exposure.   Diet: the importance of a healthy diet is discussed. They do have a healthy diet.   The benefits of regular aerobic exercise were discussed. She walks 4 times per week ,  30 minutes.   Depression screen: there are no signs or vegative symptoms of depression- irritability, change in appetite, anhedonia, sadness/tearfullness.  Cognitive assessment: the patient manages all their financial and personal affairs and is actively engaged. They could relate day,date,year and events; recalled 2/3 objects at 3 minutes; performed clock-face test normally.  The following portions of the patient's history were reviewed and updated as appropriate: allergies, current medications,  past family history, past medical history,  past surgical history, past social history  and problem list.  Visual acuity was not assessed per patient preference since she has regular follow up with her ophthalmologist. Hearing and body mass index were assessed and reviewed.   During the course of the visit the patient was educated and counseled about appropriate screening and preventive services including : fall prevention , diabetes screening, nutrition counseling, colorectal cancer screening, and recommended immunizations.    CC: The primary encounter diagnosis was Encounter for preventive health examination. Diagnoses of Hypercholesteremia, Essential hypertension, Chronic right hip pain, Breast cancer screening by mammogram, Osteopenia after menopause, Polyarthralgia, Thoracic aortic atherosclerosis ([place]), and Coronary atherosclerosis due to calcified coronary lesion (CODE) were also pertinent to this visit.  1) FOLLOW UP ON HYPERTENSION.  Not checking at home. Not taking NSAIDs . Taking telmisartan at night    2) JOINT PAIN  1) right hip x 20 years,  Now left starting to hurt.  Knees hurt in the anterior location with exercise.   Had an episode of bilateral leg pain after wearing a certain pair of shoes ,  But resolved after one day..  Took tylenol.  Worried about autoimmune or inflammatory syndromes.  Requesting screening.   3) history of chronic constipation as an adolescent. Currently having periodic loose stools followed by hard stools.  Has been taking benefiber and [place] . Had a normal colonoscopy August 2020 by Dr. Alice Reichert told it was normal.  Done at [facility]  Report not available in Epic   4) Aortic and  coronary atherosclerosis:  reviewd CT chest done in 2018 which noted mild aortic and coronary atherosclerosis  .  She has no history of events and does not use statin therapy  History Terri Melendez has a past medical history of Cancer ([place]), basal cell carcinoma (2002), Hypertension,  Osteopenia, and Sinus congestion.   She has a past surgical history that includes Tonsillectomy and adenoidectomy and Excision basal cell carcinoma.   Her family history includes Breast cancer in her maternal aunt; Cancer in her mother.She reports that she has never smoked. She has never used smokeless tobacco. She reports current alcohol use. She reports that she does not use drugs.  Outpatient Medications Prior to Visit  Medication Sig Dispense Refill  . levocetirizine (XYZAL) 5 MG tablet Take 5 mg by mouth every evening.    . mometasone (ELOCON) 0.1 % lotion Apply topically.    . mometasone (NASONEX) 50 MCG/ACT nasal spray Place 2 sprays into the nose daily.    Marland Kitchen omeprazole (PRILOSEC) 40 MG capsule Take 40 mg by mouth daily.    Marland Kitchen telmisartan (MICARDIS) 40 MG tablet Take 1 tablet (40 mg total) by mouth daily. 90 tablet 0   No facility-administered medications prior to visit.    Review of Systems   Patient denies headache, fevers, malaise, unintentional weight loss, skin rash, eye pain, sinus congestion and sinus pain, sore throat, dysphagia,  hemoptysis , cough, dyspnea, wheezing, chest pain, palpitations, orthopnea, edema, abdominal pain, nausea, melena, diarrhea, constipation, flank pain, dysuria, hematuria, urinary  Frequency, nocturia, numbness, tingling, seizures,  Focal weakness, Loss of consciousness,  Tremor, insomnia, depression, anxiety, and suicidal ideation.      Objective:  BP (!) 148/72 (BP Location: Left Arm, Patient Position: Sitting, Cuff Size: Normal)   Pulse (!) 56   Temp 98.5 F (36.9 C) (Oral)   Resp 15   Ht 5\' 4"  (1.626 m)   Wt 140 lb 9.6 oz (63.8 kg)   SpO2 99%   BMI 24.13 kg/m   Physical Exam  General appearance: alert, cooperative and appears stated age Ears: normal TM's and external ear canals both ears Throat: lips, mucosa, and tongue normal; teeth and gums normal Neck: no adenopathy, no carotid bruit, supple, symmetrical, trachea midline and  thyroid not enlarged, symmetric, no tenderness/mass/nodules Back: symmetric, no curvature. ROM normal. No CVA tenderness. Lungs: clear to auscultation bilaterally Heart: regular rate and rhythm, S1, S2 normal, no murmur, click, rub or gallop Abdomen: soft, non-tender; bowel sounds normal; no masses,  no organomegaly MSK:  No signs of synovitis on small joints.  kneees without redness or warmth.  Hip ROM WNL Pulses: 2+ and symmetric Skin: Skin color, texture, turgor normal. No rashes or lesions Lymph nodes: Cervical, supraclavicular, and axillary nodes normal.   Assessment & Plan:   Problem List Items Addressed This Visit      Unprioritized   Coronary atherosclerosis due to calcified coronary lesion (CODE)    Reviewed findings of prior CT scan today..  Patient is advised to  Initiate statin therpay starting with 20 mg atorvastatin once daily   She is contemplative.        Encounter for preventive health examination - Primary    age appropriate education and counseling updated, referrals for preventative services and immunizations addressed, dietary and smoking counseling addressed, most recent labs reviewed.  I have personally reviewed and have noted:  1) the patient's medical and social history 2) The pt's use of alcohol, tobacco, and illicit drugs 3) The patient's current medications  and supplements 4) Functional ability including ADL's, fall risk, home safety risk, hearing and visual impairment 5) Diet and physical activities 6) Evidence for depression or mood disorder 7) The patient's height, weight, and BMI have been recorded in the chart  I have made referrals, and provided counseling and education based on review of the above      Essential hypertension    Taking telmisartan 40 mg at night . Not checking readings at home. Office readings are persistently elevated.  she reports compliance with medication regimen    She is not using NSAIDs daily.  Discussed goal of 120/70   (130/80 for patients over 70)  to preserve renal function.  She has been asked to check her  BP  at home and  submit readings for evaluation. Renal function, electrolytes and screen for proteinuria are all normal   Lab Results  Component Value Date   CREATININE 0.69 12/14/2019   Lab Results  Component Value Date   LABMICR See below: 10/22/2017   LABMICR See below: 10/17/2016   MICROALBUR <0.7 12/14/2019           Relevant Orders   Comprehensive metabolic panel (Completed)   Microalbumin / creatinine urine ratio (Completed)   Hypercholesteremia    Using the Framingham risk calculator,  her 10 year risk of coronary artery disease is 20%.  Statin therapy recommended.   Lab Results  Component Value Date   CHOL 249 (H) 12/14/2019   HDL 62.20 12/14/2019   LDLCALC 173 (H) 12/14/2019   TRIG 71.0 12/14/2019   CHOLHDL 4 12/14/2019         Relevant Orders   Lipid panel (Completed)   TSH (Completed)   Polyarthralgia    Right hip plain films ordered and pending.   Screening labs show no evidence of inflammation.       Thoracic aortic atherosclerosis (HCC)    Aortic atherosclerosis :  Discussed need for statin therapy given documented evidence of moderate  atherosclerosis in the aorta noted on 2018 Chest CT  and the prognostic implications of this finding.        Other Visit Diagnoses    Chronic right hip pain       Relevant Orders   Sedimentation rate (Completed)   C-reactive protein (Completed)   DG Hip Unilat W OR W/O Pelvis 2-3 Views Right   Breast cancer screening by mammogram       Relevant Orders   MM 3D SCREEN BREAST BILATERAL   Osteopenia after menopause       Relevant Orders   DG Bone Density      I am having [name] "[place]" maintain her mometasone, levocetirizine, telmisartan, omeprazole, and mometasone.  No orders of the defined types were placed in this encounter.   There are no discontinued medications.  Follow-up: No follow-ups on  file.   Crecencio Mc, MD

## 2019-12-14 NOTE — Assessment & Plan Note (Addendum)
Taking telmisartan 40 mg at night . Not checking readings at home. Office readings are persistently elevated.  she reports compliance with medication regimen    She is not using NSAIDs daily.  Discussed goal of 120/70  (130/80 for patients over 70)  to preserve renal function.  She has been asked to check her  BP  at home and  submit readings for evaluation. Renal function, electrolytes and screen for proteinuria are all normal   Lab Results  Component Value Date   CREATININE 0.69 12/14/2019   Lab Results  Component Value Date   LABMICR See below: 10/22/2017   LABMICR See below: 10/17/2016   MICROALBUR <0.7 12/14/2019

## 2019-12-14 NOTE — Patient Instructions (Addendum)
I recommend you start checking your BP at home.  The best brand for consumer is  Omron   For BP monitor  Home readings should be 130/80 or less (not less than 110/70)   Lean Kitchen and [employer] have low carb pizza entrees made with cauliflower (but high sodium)  Docusate can be alternated with benefiber to maintain stools   60 ounces of water daily  25 g fiber daily  For bowel health   Your annual mammogram and DEXA  has been ordered.  You are encouraged (required) to call to make your appointment at [facility]    Health Maintenance After Age 71 After age 3, you are at a higher risk for certain long-term diseases and infections as well as injuries from falls. Falls are a major cause of broken bones and head injuries in people who are older than age 35. Getting regular preventive care can help to keep you healthy and well. Preventive care includes getting regular testing and making lifestyle changes as recommended by your health care provider. Talk with your health care provider about:  Which screenings and tests you should have. A screening is a test that checks for a disease when you have no symptoms.  A diet and exercise plan that is right for you. What should I know about screenings and tests to prevent falls? Screening and testing are the best ways to find a health problem early. Early diagnosis and treatment give you the best chance of managing medical conditions that are common after age 46. Certain conditions and lifestyle choices may make you more likely to have a fall. Your health care provider may recommend:  Regular vision checks. Poor vision and conditions such as cataracts can make you more likely to have a fall. If you wear glasses, make sure to get your prescription updated if your vision changes.  Medicine review. Work with your health care provider to regularly review all of the medicines you are taking, including over-the-counter medicines. Ask your health  care provider about any side effects that may make you more likely to have a fall. Tell your health care provider if any medicines that you take make you feel dizzy or sleepy.  Osteoporosis screening. Osteoporosis is a condition that causes the bones to get weaker. This can make the bones weak and cause them to break more easily.  Blood pressure screening. Blood pressure changes and medicines to control blood pressure can make you feel dizzy.  Strength and balance checks. Your health care provider may recommend certain tests to check your strength and balance while standing, walking, or changing positions.  Foot health exam. Foot pain and numbness, as well as not wearing proper footwear, can make you more likely to have a fall.  Depression screening. You may be more likely to have a fall if you have a fear of falling, feel emotionally low, or feel unable to do activities that you used to do.  Alcohol use screening. Using too much alcohol can affect your balance and may make you more likely to have a fall. What actions can I take to lower my risk of falls? General instructions  Talk with your health care provider about your risks for falling. Tell your health care provider if: ? You fall. Be sure to tell your health care provider about all falls, even ones that seem minor. ? You feel dizzy, sleepy, or off-balance.  Take over-the-counter and prescription medicines only as told by your health care provider.  These include any supplements.  Eat a healthy diet and maintain a healthy weight. A healthy diet includes low-fat dairy products, low-fat (lean) meats, and fiber from whole grains, beans, and lots of fruits and vegetables. Home safety  Remove any tripping hazards, such as rugs, cords, and clutter.  Install safety equipment such as grab bars in bathrooms and safety rails on stairs.  Keep rooms and walkways well-lit. Activity   Follow a regular exercise program to stay fit. This will  help you maintain your balance. Ask your health care provider what types of exercise are appropriate for you.  If you need a cane or walker, use it as recommended by your health care provider.  Wear supportive shoes that have nonskid soles. Lifestyle  Do not drink alcohol if your health care provider tells you not to drink.  If you drink alcohol, limit how much you have: ? 0-1 drink a day for women. ? 0-2 drinks a day for men.  Be aware of how much alcohol is in your drink. In the [place] one drink equals one typical bottle of beer (12 oz), one-half glass of wine (5 oz), or one shot of hard liquor (1 oz).  Do not use any products that contain nicotine or tobacco, such as cigarettes and e-cigarettes. If you need help quitting, ask your health care provider. Summary  Having a healthy lifestyle and getting preventive care can help to protect your health and wellness after age 8.  Screening and testing are the best way to find a health problem early and help you avoid having a fall. Early diagnosis and treatment give you the best chance for managing medical conditions that are more common for people who are older than age 90.  Falls are a major cause of broken bones and head injuries in people who are older than age 47. Take precautions to prevent a fall at home.  Work with your health care provider to learn what changes you can make to improve your health and wellness and to prevent falls. This information is not intended to replace advice given to you by your health care provider. Make sure you discuss any questions you have with your health care provider. Document Revised: 06/05/2018 Document Reviewed: 12/26/2016 Elsevier Patient Education  2020 [employer].

## 2019-12-15 DIAGNOSIS — M2550 Pain in unspecified joint: Secondary | ICD-10-CM | POA: Insufficient documentation

## 2019-12-15 DIAGNOSIS — I2584 Coronary atherosclerosis due to calcified coronary lesion: Secondary | ICD-10-CM | POA: Insufficient documentation

## 2019-12-15 DIAGNOSIS — I700 Atherosclerosis of aorta: Secondary | ICD-10-CM | POA: Insufficient documentation

## 2019-12-15 NOTE — Assessment & Plan Note (Signed)
Reviewed findings of prior CT scan today..  Patient is advised to  Initiate statin therpay starting with 20 mg atorvastatin once daily   She is contemplative.

## 2019-12-15 NOTE — Assessment & Plan Note (Signed)
Aortic atherosclerosis :  Discussed need for statin therapy given documented evidence of moderate  atherosclerosis in the aorta noted on 2018 Chest CT  and the prognostic implications of this finding.

## 2019-12-15 NOTE — Assessment & Plan Note (Signed)

## 2019-12-15 NOTE — Assessment & Plan Note (Addendum)
Using the Framingham risk calculator,  her 10 year risk of coronary artery disease is 20%.  Statin therapy recommended.   Lab Results  Component Value Date   CHOL 249 (H) 12/14/2019   HDL 62.20 12/14/2019   LDLCALC 173 (H) 12/14/2019   TRIG 71.0 12/14/2019   CHOLHDL 4 12/14/2019

## 2019-12-15 NOTE — Progress Notes (Signed)
  Based on your fasting cholesterol and your concurrent history of hypertension ,  your 10 year risk of having some type of coronary event (including heart attack) is 19% , meaning that one of  every 5 women  with the same medical statistics will have a heart attack in the next 10 years.     Recall that your aorta and coronary arteries were noted to have mild atherosclerosis on the 2018 CT chest that you had.  This supports my opinion that you are at increased risk of events. The [employer] of Cardiology recommends starting patients on moderate intensity statin therapy for people with this risk  to lower your risks of these events.  I would like you to consider starting a medication called atorvastatin (generic for Lipitor  to lower your risk of heart attacks and strokes.     Please let me know if you are willing to try the medication .  If you are,  we will need to repeat liver enzymes at 3 weeks  (no fasting required).   Regards,   Deborra Medina, MD

## 2019-12-15 NOTE — Assessment & Plan Note (Signed)
Right hip plain films ordered and pending.   Screening labs show no evidence of inflammation.

## 2019-12-16 ENCOUNTER — Other Ambulatory Visit: Payer: Self-pay | Admitting: Internal Medicine

## 2019-12-16 DIAGNOSIS — E7800 Pure hypercholesterolemia, unspecified: Secondary | ICD-10-CM

## 2019-12-16 MED ORDER — ATORVASTATIN CALCIUM 20 MG PO TABS: 20.0000 mg | ORAL_TABLET | Freq: Every day | ORAL | 0 refills | Status: DC

## 2019-12-28 ENCOUNTER — Other Ambulatory Visit: Payer: Self-pay

## 2019-12-28 ENCOUNTER — Other Ambulatory Visit: Payer: Self-pay | Admitting: Internal Medicine

## 2019-12-28 DIAGNOSIS — I10 Essential (primary) hypertension: Secondary | ICD-10-CM

## 2019-12-28 MED ORDER — HYDROCHLOROTHIAZIDE 12.5 MG PO CAPS: 12.5000 mg | ORAL_CAPSULE | Freq: Every day | ORAL | 0 refills | Status: DC

## 2019-12-28 MED ORDER — TELMISARTAN 40 MG PO TABS: 40.0000 mg | ORAL_TABLET | Freq: Every day | ORAL | 0 refills | Status: DC

## 2019-12-28 NOTE — Assessment & Plan Note (Signed)
Adding hctz 12.5 mg daily for home readings consistently > 140

## 2020-01-12 ENCOUNTER — Other Ambulatory Visit: Payer: Self-pay

## 2020-01-12 ENCOUNTER — Other Ambulatory Visit (INDEPENDENT_AMBULATORY_CARE_PROVIDER_SITE_OTHER): Payer: Medicare HMO

## 2020-01-12 DIAGNOSIS — E7800 Pure hypercholesterolemia, unspecified: Secondary | ICD-10-CM

## 2020-01-12 LAB — COMPREHENSIVE METABOLIC PANEL
ALT: 13 U/L (ref 0–35)
AST: 17 U/L (ref 0–37)
Albumin: 4.1 g/dL (ref 3.5–5.2)
Alkaline Phosphatase: 100 U/L (ref 39–117)
BUN: 21 mg/dL (ref 6–23)
CO2: 28 mEq/L (ref 19–32)
Calcium: 9 mg/dL (ref 8.4–10.5)
Chloride: 103 mEq/L (ref 96–112)
Creatinine, Ser: 0.82 mg/dL (ref 0.40–1.20)
GFR: 74.07 mL/min (ref >60.00)
Glucose, Bld: 159 mg/dL — ABNORMAL HIGH (ref 70–99)
Potassium: 4.1 mEq/L (ref 3.5–5.1)
Sodium: 137 mEq/L (ref 135–145)
Total Bilirubin: 0.4 mg/dL (ref 0.2–1.2)
Total Protein: 6.3 g/dL (ref 6.0–8.3)

## 2020-01-15 ENCOUNTER — Other Ambulatory Visit: Payer: Self-pay | Admitting: Internal Medicine

## 2020-01-15 DIAGNOSIS — E7800 Pure hypercholesterolemia, unspecified: Secondary | ICD-10-CM

## 2020-01-15 NOTE — Progress Notes (Signed)
  Your repeat  liver tests were normal; please continue the cholesterol medication and plan to repeat fasting labs in 6 months   Regards,   Deborra Medina, MD

## 2020-02-10 ENCOUNTER — Other Ambulatory Visit: Payer: Self-pay

## 2020-02-10 ENCOUNTER — Ambulatory Visit (INDEPENDENT_AMBULATORY_CARE_PROVIDER_SITE_OTHER): Payer: Medicare HMO

## 2020-02-10 ENCOUNTER — Other Ambulatory Visit: Payer: Medicare HMO

## 2020-02-10 DIAGNOSIS — M25551 Pain in right hip: Secondary | ICD-10-CM | POA: Diagnosis not present

## 2020-02-10 DIAGNOSIS — G8929 Other chronic pain: Secondary | ICD-10-CM

## 2020-02-11 ENCOUNTER — Ambulatory Visit: Payer: Medicare HMO | Admitting: Dermatology

## 2020-02-11 ENCOUNTER — Encounter: Payer: Self-pay | Admitting: Dermatology

## 2020-02-11 ENCOUNTER — Other Ambulatory Visit: Payer: Self-pay

## 2020-02-11 DIAGNOSIS — D229 Melanocytic nevi, unspecified: Secondary | ICD-10-CM

## 2020-02-11 DIAGNOSIS — Z85828 Personal history of other malignant neoplasm of skin: Secondary | ICD-10-CM

## 2020-02-11 DIAGNOSIS — D1800 Hemangioma unspecified site: Secondary | ICD-10-CM

## 2020-02-11 DIAGNOSIS — C44319 Basal cell carcinoma of skin of other parts of face: Secondary | ICD-10-CM

## 2020-02-11 DIAGNOSIS — L814 Other melanin hyperpigmentation: Secondary | ICD-10-CM

## 2020-02-11 DIAGNOSIS — L821 Other seborrheic keratosis: Secondary | ICD-10-CM

## 2020-02-11 DIAGNOSIS — Z1283 Encounter for screening for malignant neoplasm of skin: Secondary | ICD-10-CM | POA: Diagnosis not present

## 2020-02-11 DIAGNOSIS — D492 Neoplasm of unspecified behavior of bone, soft tissue, and skin: Secondary | ICD-10-CM

## 2020-02-11 DIAGNOSIS — L578 Other skin changes due to chronic exposure to nonionizing radiation: Secondary | ICD-10-CM

## 2020-02-11 NOTE — Patient Instructions (Signed)

## 2020-02-11 NOTE — Progress Notes (Signed)
   Follow-Up Visit   Subjective  Terri Melendez is a 69 y.o. female who presents for the following: Annual Exam (Yearly mole check, Hx of BCC R cheek ). The patient presents for Total-Body Skin Exam (TBSE) for skin cancer screening and mole check.  The following portions of the chart were reviewed this encounter and updated as appropriate:   Tobacco  Allergies  Meds  Problems  Med Hx  Surg Hx  Fam Hx     Review of Systems:  No other skin or systemic complaints except as noted in HPI or Assessment and Plan.  Objective  Well appearing patient in no apparent distress; mood and affect are within normal limits.  A full examination was performed including scalp, head, eyes, ears, nose, lips, neck, chest, axillae, abdomen, back, buttocks, bilateral upper extremities, bilateral lower extremities, hands, feet, fingers, toes, fingernails, and toenails. All findings within normal limits unless otherwise noted below.  Objective  R cheek: Well healed scar with no evidence of recurrence.   Objective  glabella: 0.8 cm Pink patch         Assessment & Plan  History of basal cell carcinoma (BCC) R cheek  Clear. Observe for recurrence. Call clinic for new or changing lesions.  Recommend regular skin exams, daily broad-spectrum spf 30+ sunscreen use, and photoprotection.     Neoplasm of skin glabella  Skin / nail biopsy Type of biopsy: tangential   Informed consent: discussed and consent obtained   Timeout: patient name, date of birth, surgical site, and procedure verified   Procedure prep:  Patient was prepped and draped in usual sterile fashion Prep type:  Isopropyl alcohol Anesthesia: the lesion was anesthetized in a standard fashion   Anesthetic:  1% lidocaine w/ epinephrine 1-100,000 buffered w/ 8.4% NaHCO3 Instrument used: flexible razor blade   Hemostasis achieved with: pressure, aluminum chloride and electrodesiccation   Outcome: patient tolerated procedure well    Post-procedure details: sterile dressing applied and wound care instructions given   Dressing type: bandage and petrolatum    Specimen 1 - Surgical pathology Differential Diagnosis: R/O BCC vs other   Check Margins: No Pink patch  If pathology confirm skin cancer consider Mohs discussed with pt   Skin cancer screening   Lentigines - Scattered tan macules - Discussed due to sun exposure - Benign, observe - Call for any changes  Seborrheic Keratoses - Stuck-on, waxy, tan-brown papules and plaques  - Discussed benign etiology and prognosis. - Observe - Call for any changes  Melanocytic Nevi - Tan-brown and/or pink-flesh-colored symmetric macules and papules - Benign appearing on exam today - Observation - Call clinic for new or changing moles - Recommend daily use of broad spectrum spf 30+ sunscreen to sun-exposed areas.   Hemangiomas - Red papules - Discussed benign nature - Observe - Call for any changes  Actinic Damage - Chronic, secondary to cumulative UV/sun exposure - diffuse scaly erythematous macules with underlying dyspigmentation - Recommend daily broad spectrum sunscreen SPF 30+ to sun-exposed areas, reapply every 2 hours as needed.  - Call for new or changing lesions.  Skin cancer screening performed today.  Return in about 1 year (around 02/10/2021).  IMarye Round, CMA, am acting as scribe for Sarina Ser, MD .  Documentation: I have reviewed the above documentation for accuracy and completeness, and I agree with the above.  Sarina Ser, MD

## 2020-02-11 NOTE — Progress Notes (Signed)
No fractures,  just mild degenerative changes in the lumbar spine and in the hip

## 2020-02-15 ENCOUNTER — Telehealth: Payer: Self-pay

## 2020-02-15 ENCOUNTER — Encounter: Payer: Self-pay | Admitting: Dermatology

## 2020-02-15 DIAGNOSIS — C44319 Basal cell carcinoma of skin of other parts of face: Secondary | ICD-10-CM

## 2020-02-15 NOTE — Telephone Encounter (Signed)
Pt informed of results and treatment options. Pt wants to go to Dr. Lacinda Axon for [facility] surgery.   Referral placed.

## 2020-02-15 NOTE — Telephone Encounter (Signed)
-----   Message from Ralene Bathe, MD sent at 02/13/2020  4:25 PM EST ----- Diagnosis Skin , glabella SUPERFICIAL AND NODULAR BASAL CELL CARCINOMA  Cancer - BCC Schedule for MOHS (if she wants to consider EDC, it appears to be early without sclerosus, we could consider)

## 2020-02-15 NOTE — Addendum Note (Signed)
Addended by: Harriett Sine on: 02/15/2020 05:19 PM   Modules accepted: Orders

## 2020-02-15 NOTE — Telephone Encounter (Signed)
LVM for pt call for results, JS

## 2020-03-04 MED ORDER — ATORVASTATIN CALCIUM 20 MG PO TABS: 20.0000 mg | ORAL_TABLET | Freq: Every day | ORAL | 0 refills | Status: DC

## 2020-03-09 ENCOUNTER — Encounter: Payer: Self-pay | Admitting: Dermatology

## 2020-03-16 ENCOUNTER — Other Ambulatory Visit: Payer: Self-pay

## 2020-03-16 ENCOUNTER — Ambulatory Visit
Admission: RE | Admit: 2020-03-16 | Discharge: 2020-03-16 | Disposition: A | Discharge status: Home / Self Care | Payer: Medicare HMO | Source: Ambulatory Visit | Attending: Internal Medicine | Admitting: Internal Medicine

## 2020-03-16 DIAGNOSIS — M8580 Other specified disorders of bone density and structure, unspecified site: Secondary | ICD-10-CM

## 2020-03-16 DIAGNOSIS — Z1231 Encounter for screening mammogram for malignant neoplasm of breast: Secondary | ICD-10-CM | POA: Insufficient documentation

## 2020-03-16 DIAGNOSIS — Z780 Asymptomatic menopausal state: Secondary | ICD-10-CM | POA: Diagnosis present

## 2020-03-16 MED ORDER — HYDROCHLOROTHIAZIDE 12.5 MG PO CAPS: 12.5000 mg | ORAL_CAPSULE | Freq: Every day | ORAL | 1 refills | Status: DC

## 2020-03-16 MED ORDER — TELMISARTAN 40 MG PO TABS: 40.0000 mg | ORAL_TABLET | Freq: Every day | ORAL | 1 refills | Status: DC

## 2020-06-03 DIAGNOSIS — Z85828 Personal history of other malignant neoplasm of skin: Secondary | ICD-10-CM | POA: Insufficient documentation

## 2020-06-13 ENCOUNTER — Ambulatory Visit: Payer: Medicare HMO | Admitting: Internal Medicine

## 2020-06-15 ENCOUNTER — Other Ambulatory Visit: Payer: Self-pay

## 2020-06-15 ENCOUNTER — Ambulatory Visit (INDEPENDENT_AMBULATORY_CARE_PROVIDER_SITE_OTHER): Payer: Medicare HMO | Admitting: Internal Medicine

## 2020-06-15 ENCOUNTER — Encounter: Payer: Self-pay | Admitting: Internal Medicine

## 2020-06-15 VITALS — BP 120/52 | HR 83 | Temp 98.0°F | Resp 15 | Ht 64.0 in | Wt 135.6 lb

## 2020-06-15 DIAGNOSIS — E7800 Pure hypercholesterolemia, unspecified: Secondary | ICD-10-CM | POA: Diagnosis not present

## 2020-06-15 DIAGNOSIS — I700 Atherosclerosis of aorta: Secondary | ICD-10-CM

## 2020-06-15 DIAGNOSIS — I10 Essential (primary) hypertension: Secondary | ICD-10-CM | POA: Diagnosis not present

## 2020-06-15 DIAGNOSIS — G4733 Obstructive sleep apnea (adult) (pediatric): Secondary | ICD-10-CM

## 2020-06-15 DIAGNOSIS — E559 Vitamin D deficiency, unspecified: Secondary | ICD-10-CM

## 2020-06-15 DIAGNOSIS — Z23 Encounter for immunization: Secondary | ICD-10-CM

## 2020-06-15 DIAGNOSIS — E538 Deficiency of other specified B group vitamins: Secondary | ICD-10-CM | POA: Diagnosis not present

## 2020-06-15 DIAGNOSIS — K2100 Gastro-esophageal reflux disease with esophagitis, without bleeding: Secondary | ICD-10-CM

## 2020-06-15 DIAGNOSIS — Z9989 Dependence on other enabling machines and devices: Secondary | ICD-10-CM

## 2020-06-15 DIAGNOSIS — J301 Allergic rhinitis due to pollen: Secondary | ICD-10-CM

## 2020-06-15 DIAGNOSIS — E041 Nontoxic single thyroid nodule: Secondary | ICD-10-CM

## 2020-06-15 LAB — COMPREHENSIVE METABOLIC PANEL
ALT: 13 U/L (ref 0–35)
AST: 15 U/L (ref 0–37)
Albumin: 4 g/dL (ref 3.5–5.2)
Alkaline Phosphatase: 80 U/L (ref 39–117)
BUN: 22 mg/dL (ref 6–23)
CO2: 30 mEq/L (ref 19–32)
Calcium: 9.5 mg/dL (ref 8.4–10.5)
Chloride: 104 mEq/L (ref 96–112)
Creatinine, Ser: 0.77 mg/dL (ref 0.40–1.20)
GFR: 79.64 mL/min (ref >60.00)
Glucose, Bld: 62 mg/dL — ABNORMAL LOW (ref 70–99)
Potassium: 4.1 mEq/L (ref 3.5–5.1)
Sodium: 139 mEq/L (ref 135–145)
Total Bilirubin: 0.4 mg/dL (ref 0.2–1.2)
Total Protein: 6.3 g/dL (ref 6.0–8.3)

## 2020-06-15 LAB — VITAMIN D 25 HYDROXY (VIT D DEFICIENCY, FRACTURES): VITD: 28.17 ng/mL — ABNORMAL LOW (ref 30.00–100.00)

## 2020-06-15 LAB — LIPID PANEL
Cholesterol: 146 mg/dL (ref 0–200)
HDL: 55.5 mg/dL (ref >39.00)
LDL Cholesterol: 79 mg/dL (ref 0–99)
NonHDL: 90.8
Total CHOL/HDL Ratio: 3
Triglycerides: 61 mg/dL (ref 0.0–149.0)
VLDL: 12.2 mg/dL (ref 0.0–40.0)

## 2020-06-15 LAB — VITAMIN B12: Vitamin B-12: 303 pg/mL (ref 211–911)

## 2020-06-15 MED ORDER — ATORVASTATIN CALCIUM 20 MG PO TABS: 20.0000 mg | ORAL_TABLET | Freq: Every day | ORAL | 1 refills | Status: DC

## 2020-06-15 NOTE — Patient Instructions (Signed)
Your blood pressure is perfect!  Change nothing  Return in 6 months for CPE   Checking Vitamin D, B12 ,  Liver /kidney and cholesterol today

## 2020-06-15 NOTE — Progress Notes (Signed)
Subjective:  Patient ID: Terri Melendez, female    DOB: 06-17-1950  Age: 70 y.o. MRN: [id number]  CC: The primary encounter diagnosis was Vitamin D deficiency. Diagnoses of Essential hypertension, Hypercholesteremia, B12 deficiency, Need for pneumococcal vaccination, Thoracic aortic atherosclerosis ([place]), Non-functional thyroid nodule, OSA on CPAP, Seasonal allergic rhinitis due to pollen, and Gastroesophageal reflux disease with esophagitis without hemorrhage were also pertinent to this visit.  HPI Terri Melendez presents for follow up on hypertension, OSA and hyperlipidemia  This visit occurred during the SARS-CoV-2 public health emergency.  Safety protocols were in place, including screening questions prior to the visit, additional usage of staff PPE, and extensive cleaning of exam room while observing appropriate contact time as indicated for disinfecting solutions.   Patient is taking HCTZ in the am  telmisartan In the evening and notes no adverse effects.  Home BP readings have been done about once per week and are  generally < 130/80 .  She is avoiding added salt in her diet and walking regularly about 3 times per week for exercise  .  She is tolerating initiation of atorvastatin with no side effects.  Repeat liver enzymes were checked after initiation and normal.   OSA : Diagnosed remotely  by sleep study. She is wearing her CPAP every night a minimum of 6 hours per night and notes improved daytime wakefulness and decreased fatigue   Outpatient Medications Prior to Visit  Medication Sig Dispense Refill  . hydrochlorothiazide (MICROZIDE) 12.5 MG capsule Take 1 capsule (12.5 mg total) by mouth daily. 90 capsule 1  . levocetirizine (XYZAL) 5 MG tablet Take 5 mg by mouth every evening.    . mometasone (ELOCON) 0.1 % lotion Apply topically.    . mometasone (NASONEX) 50 MCG/ACT nasal spray Place 2 sprays into the nose daily.    Marland Kitchen omeprazole (PRILOSEC) 40 MG capsule Take 40 mg by  mouth daily.    Marland Kitchen telmisartan (MICARDIS) 40 MG tablet Take 1 tablet (40 mg total) by mouth daily. 90 tablet 1  . atorvastatin (LIPITOR) 20 MG tablet Take 1 tablet (20 mg total) by mouth daily. 90 tablet 0   No facility-administered medications prior to visit.    Review of Systems;  Patient denies headache, fevers, malaise, unintentional weight loss, skin rash, eye pain, sinus congestion and sinus pain, sore throat, dysphagia,  hemoptysis , cough, dyspnea, wheezing, chest pain, palpitations, orthopnea, edema, abdominal pain, nausea, melena, diarrhea, constipation, flank pain, dysuria, hematuria, urinary  Frequency, nocturia, numbness, tingling, seizures,  Focal weakness, Loss of consciousness,  Tremor, insomnia, depression, anxiety, and suicidal ideation.      Objective:  BP (!) 120/52 (BP Location: Left Arm, Patient Position: Sitting, Cuff Size: Normal)   Pulse 83   Temp 98 F (36.7 C) (Oral)   Resp 15   Ht 5\' 4"  (1.626 m)   Wt 135 lb 9.6 oz (61.5 kg)   SpO2 98%   BMI 23.28 kg/m   BP Readings from Last 3 Encounters:  06/15/20 (!) 120/52  12/14/19 (!) 148/72  09/21/19 (!) 156/68    Wt Readings from Last 3 Encounters:  06/15/20 135 lb 9.6 oz (61.5 kg)  12/14/19 140 lb 9.6 oz (63.8 kg)  10/01/19 140 lb (63.5 kg)    General appearance: alert, cooperative and appears stated age Ears: normal TM's and external ear canals both ears Throat: lips, mucosa, and tongue normal; teeth and gums normal Neck: no adenopathy, no carotid bruit, supple, symmetrical, trachea midline and thyroid  not enlarged, symmetric, no tenderness/mass/nodules Back: symmetric, no curvature. ROM normal. No CVA tenderness. Lungs: clear to auscultation bilaterally Heart: regular rate and rhythm, S1, S2 normal, no murmur, click, rub or gallop Abdomen: soft, non-tender; bowel sounds normal; no masses,  no organomegaly Pulses: 2+ and symmetric Skin: Skin color, texture, turgor normal. No rashes or lesions Lymph  nodes: Cervical, supraclavicular, and axillary nodes normal.  No results found for: HGBA1C  Lab Results  Component Value Date   CREATININE 0.77 06/15/2020   CREATININE 0.82 01/12/2020   CREATININE 0.69 12/14/2019    Lab Results  Component Value Date   WBC 11.6 (H) 09/20/2019   HGB 11.9 (L) 09/20/2019   HCT 36.6 09/20/2019   PLT 233 09/20/2019   GLUCOSE 62 (L) 06/15/2020   CHOL 146 06/15/2020   TRIG 61.0 06/15/2020   HDL 55.50 06/15/2020   LDLCALC 79 06/15/2020   ALT 13 06/15/2020   AST 15 06/15/2020   NA 139 06/15/2020   K 4.1 06/15/2020   CL 104 06/15/2020   CREATININE 0.77 06/15/2020   BUN 22 06/15/2020   CO2 30 06/15/2020   TSH 0.99 12/14/2019   MICROALBUR <0.7 12/14/2019    DG Bone Density  Result Date: 03/16/2020 EXAM: DUAL X-RAY ABSORPTIOMETRY (DXA) FOR BONE MINERAL DENSITY IMPRESSION: Your patient Terri Melendez completed a BMD test on 03/16/2020 using the [place] (software version: 14.10) manufactured by [employer]. The following summarizes the results of our evaluation. Technologist: SCE PATIENT BIOGRAPHICAL: Name: Terri, Melendez Patient ID: [id number] Birth Date: 15-Apr-1950 Height: 64.0 in. Gender: Female Exam Date: 03/16/2020 Weight: 139.8 lbs. Indications: Caucasian, Postmenopausal Fractures: Treatments: DENSITOMETRY RESULTS: Site      Region     Measured Date Measured Age WHO Classification Young Adult T-score BMD         %Change vs. Previous Significant Change (*) AP Spine L1-L2 03/16/2020 67.5 Normal -0.9 1.061 g/cm2 -1.7% - AP Spine L1-L2 11/17/2015 63.1 Normal -0.8 1.079 g/cm2 -6.0% Yes AP Spine L1-L2 09/23/2012 60.0 Normal -0.2 1.148 g/cm2 - - DualFemur Neck Left 03/16/2020 67.5 Osteopenia -1.9 0.778 g/cm2 -2.1% - DualFemur Neck Left 11/17/2015 63.1 Osteopenia -1.7 0.795 g/cm2 -1.7% - DualFemur Neck Left 09/23/2012 60.0 Osteopenia -1.6 0.809 g/cm2 - - DualFemur Total Mean 03/16/2020 67.5 Normal -0.9 0.892 g/cm2 -5.2% Yes  DualFemur Total Mean 11/17/2015 63.1 Normal -0.5 0.941 g/cm2 -2.4% Yes DualFemur Total Mean 09/23/2012 60.0 Normal -0.3 0.964 g/cm2 - - ASSESSMENT: The BMD measured at Femur Neck Left is 0.778 g/cm2 with a T-score of -1.9. This patient is considered osteopenic according to [facility]) criteria. The scan quality is good. Compared with prior study, there has been no significant change in the spine. Compared with prior study, there has been a significant decrease in the total hip. L3 and L4 was excluded due to degenerative changes. [facility] Pharmacologist [facility]) criteria for post-menopausal, Caucasian Women: Normal:                   T-score at or above -1 SD Osteopenia/low bone mass: T-score between -1 and -2.5 SD Osteoporosis:             T-score at or below -2.5 SD RECOMMENDATIONS: 1. All patients should optimize calcium and vitamin D intake. 2. Consider FDA-approved medical therapies in postmenopausal women and men aged 46 years and older, based on the following: a. A hip or vertebral(clinical or morphometric) fracture b. T-score < -2.5 at the femoral neck or  spine after appropriate evaluation to exclude secondary causes c. Low bone mass (T-score between -1.0 and -2.5 at the femoral neck or spine) and a 10-year probability of a hip fracture > 3% or a 10-year probability of a major osteoporosis-related fracture > 20% based on the [place]-adapted WHO algorithm 3. Clinician judgment and/or patient preferences may indicate treatment for people with 10-year fracture probabilities above or below these levels FOLLOW-UP: People with diagnosed cases of osteoporosis or at high risk for fracture should have regular bone mineral density tests. For patients eligible for Medicare, routine testing is allowed once every 2 years. The testing frequency can be increased to one year for patients who have rapidly progressing disease, those who are receiving or discontinuing medical therapy to restore bone mass, or have  additional risk factors. I have reviewed this report, and agree with the above findings. [facility] Dear Dr Derrel Nip, Your patient Flora Parks Haslam completed a FRAX assessment on 03/16/2020 using the [place] (analysis version: 14.10) manufactured by [employer]. The following summarizes the results of our evaluation. PATIENT BIOGRAPHICAL: Name: Amyra, Vantuyl Patient ID: [id number] Birth Date: [birth year] Height:    64.0 in. Gender:     [age]    [age]       Weight:    139.8 lbs. Ethnicity:  White                            Exam Date: 03/16/2020 FRAX* RESULTS:  (version: 3.5) 10-year Probability of Fracture1 Major Osteoporotic Fracture2 Hip Fracture 10.7% 1.7% Population: [place] (Caucasian) Risk Factors: None Based on Femur (Left) Neck BMD 1 -The 10-year probability of fracture may be lower than reported if the patient has received treatment. 2 -Major Osteoporotic Fracture: Clinical Spine, Forearm, Hip or Shoulder *FRAX is a Materials engineer of the [employer] for Metabolic Bone Disease, a Laura (WHO) [facility]. ASSESSMENT: The probability of a major osteoporotic fracture is 10.7% within the next ten years. The probability of a hip fracture is 1.7% within the next ten years. . Electronically Signed   By: Lowella Grip III M.D.   On: 03/16/2020 13:49   MM 3D SCREEN BREAST BILATERAL  Result Date: 03/16/2020 CLINICAL DATA:  Screening. EXAM: DIGITAL SCREENING BILATERAL MAMMOGRAM WITH TOMO AND CAD COMPARISON:  Previous exam(s). ACR Breast Density Category c: The breast tissue is heterogeneously dense, which may obscure small masses. FINDINGS: There are no findings suspicious for malignancy. Images were processed with CAD. IMPRESSION: No mammographic evidence of malignancy. A result letter of this screening mammogram will be mailed directly to the patient. RECOMMENDATION: Screening mammogram in one year.  (Code:SM-B-01Y) BI-RADS CATEGORY  1: Negative. Electronically Signed   By: Dorise Bullion III M.D   On: 03/16/2020 15:23    Assessment & Plan:   Problem List Items Addressed This Visit      Unprioritized   OSA on CPAP    Diagnosed by sleep study. She is wearing her CPAP every night a minimum of 6 hours per night and notes improved daytime wakefulness and decreased fatigue       Hypercholesteremia    She has evidence of atherosclerosis in coronaries and was advised to consider statin therpay starting with 20 mg atorvastatin , which she has been tolerating.  Repeat lipids are excellent and LFTs are normal.   Lab Results  Component Value Date  CHOL 146 06/15/2020   HDL 55.50 06/15/2020   LDLCALC 79 06/15/2020   TRIG 61.0 06/15/2020   CHOLHDL 3 06/15/2020   Lab Results  Component Value Date   ALT 13 06/15/2020   AST 15 06/15/2020   GGT 15 10/10/2016   ALKPHOS 80 06/15/2020   BILITOT 0.4 06/15/2020         Relevant Medications   atorvastatin (LIPITOR) 20 MG tablet   Other Relevant Orders   Lipid panel (Completed)   Essential hypertension    Well controlled on current regimen of telmisartan 40 mg and hctz 25 mg.  Renal function stable, no changes today. Lab Results  Component Value Date   CREATININE 0.77 06/15/2020   Lab Results  Component Value Date   NA 139 06/15/2020   K 4.1 06/15/2020   CL 104 06/15/2020   CO2 30 06/15/2020         Relevant Medications   atorvastatin (LIPITOR) 20 MG tablet   Other Relevant Orders   Comprehensive metabolic panel (Completed)   Non-functional thyroid nodule    Incidental finding during prior ER workup for chest pain.  Followed with annual [place] by Anda Latina.  Thyroid function is normal.   Lab Results  Component Value Date   TSH 0.99 12/14/2019         Thoracic aortic atherosclerosis ([place])    Noted on CT.  Statin therapy initiated and tolerated.       Relevant Medications   atorvastatin (LIPITOR) 20 MG tablet    Allergic rhinitis    Managed with antihistamine and steroid nasal spray      GERD with esophagitis    Managed with daily PPI due to recurrent esophagitis. Supplementation of B12 and vitamin D advised   Lab Results  Component Value Date   VITAMINB12 303 06/15/2020   Last vitamin D Lab Results  Component Value Date   VD25OH 28.17 (L) 06/15/2020         Other Visit Diagnoses    Vitamin D deficiency    -  Primary   Relevant Orders   VITAMIN D 25 Hydroxy (Vit-D Deficiency, Fractures) (Completed)   B12 deficiency       Relevant Orders   Vitamin B12 (Completed)   Need for pneumococcal vaccination       Relevant Orders   Pneumococcal conjugate vaccine 20-valent (Prevnar 20) (Completed)     I provided  30 minutes of  face-to-face time during this encounter reviewing patient's current problems and past surgeries, labs and imaging studies, providing counseling on the above mentioned problems , and coordination  of care .  I am having Terri Melendez "[name]" maintain her mometasone, levocetirizine, omeprazole, mometasone, hydrochlorothiazide, telmisartan, and atorvastatin.  Meds ordered this encounter  Medications  . atorvastatin (LIPITOR) 20 MG tablet    Sig: Take 1 tablet (20 mg total) by mouth daily.    Dispense:  90 tablet    Refill:  1    Medications Discontinued During This Encounter  Medication Reason  . atorvastatin (LIPITOR) 20 MG tablet Reorder    Follow-up: No follow-ups on file.   Crecencio Mc, MD

## 2020-06-17 DIAGNOSIS — J309 Allergic rhinitis, unspecified: Secondary | ICD-10-CM | POA: Insufficient documentation

## 2020-06-17 DIAGNOSIS — K2100 Gastro-esophageal reflux disease with esophagitis, without bleeding: Secondary | ICD-10-CM | POA: Insufficient documentation

## 2020-06-17 NOTE — Assessment & Plan Note (Signed)
Managed with antihistamine and steroid nasal spray

## 2020-06-17 NOTE — Assessment & Plan Note (Signed)
Noted on CT.  Statin therapy initiated and tolerated.

## 2020-06-17 NOTE — Assessment & Plan Note (Signed)
Well controlled on current regimen of telmisartan 40 mg and hctz 25 mg.  Renal function stable, no changes today. Lab Results  Component Value Date   CREATININE 0.77 06/15/2020   Lab Results  Component Value Date   NA 139 06/15/2020   K 4.1 06/15/2020   CL 104 06/15/2020   CO2 30 06/15/2020

## 2020-06-17 NOTE — Assessment & Plan Note (Signed)
Incidental finding during prior ER workup for chest pain.  Followed with annual [place] by Terri Melendez.  Thyroid function is normal.   Lab Results  Component Value Date   TSH 0.99 12/14/2019

## 2020-06-17 NOTE — Assessment & Plan Note (Signed)
She has evidence of atherosclerosis in coronaries and was advised to consider statin therpay starting with 20 mg atorvastatin , which she has been tolerating.  Repeat lipids are excellent and LFTs are normal.   Lab Results  Component Value Date   CHOL 146 06/15/2020   HDL 55.50 06/15/2020   LDLCALC 79 06/15/2020   TRIG 61.0 06/15/2020   CHOLHDL 3 06/15/2020   Lab Results  Component Value Date   ALT 13 06/15/2020   AST 15 06/15/2020   GGT 15 10/10/2016   ALKPHOS 80 06/15/2020   BILITOT 0.4 06/15/2020

## 2020-06-17 NOTE — Assessment & Plan Note (Signed)
Managed with daily PPI due to recurrent esophagitis. Supplementation of B12 and vitamin D advised   Lab Results  Component Value Date   VITAMINB12 303 06/15/2020   Last vitamin D Lab Results  Component Value Date   VD25OH 28.17 (L) 06/15/2020

## 2020-06-17 NOTE — Assessment & Plan Note (Signed)
Diagnosed by sleep study. She is wearing her CPAP every night a minimum of 6 hours per night and notes improved daytime wakefulness and decreased fatigue  

## 2020-06-22 ENCOUNTER — Other Ambulatory Visit: Payer: Self-pay | Admitting: Internal Medicine

## 2020-06-22 DIAGNOSIS — Z23 Encounter for immunization: Secondary | ICD-10-CM

## 2020-06-27 ENCOUNTER — Other Ambulatory Visit: Payer: Self-pay | Admitting: Internal Medicine

## 2020-07-06 ENCOUNTER — Other Ambulatory Visit (INDEPENDENT_AMBULATORY_CARE_PROVIDER_SITE_OTHER): Payer: Medicare HMO

## 2020-07-06 ENCOUNTER — Other Ambulatory Visit: Payer: Self-pay

## 2020-07-06 DIAGNOSIS — Z23 Encounter for immunization: Secondary | ICD-10-CM

## 2020-07-06 DIAGNOSIS — E7800 Pure hypercholesterolemia, unspecified: Secondary | ICD-10-CM

## 2020-07-06 LAB — COMPREHENSIVE METABOLIC PANEL
ALT: 13 U/L (ref 0–35)
AST: 15 U/L (ref 0–37)
Albumin: 4.1 g/dL (ref 3.5–5.2)
Alkaline Phosphatase: 84 U/L (ref 39–117)
BUN: 21 mg/dL (ref 6–23)
CO2: 30 mEq/L (ref 19–32)
Calcium: 9.3 mg/dL (ref 8.4–10.5)
Chloride: 104 mEq/L (ref 96–112)
Creatinine, Ser: 0.78 mg/dL (ref 0.40–1.20)
GFR: 78.38 mL/min (ref >60.00)
Glucose, Bld: 90 mg/dL (ref 70–99)
Potassium: 3.8 mEq/L (ref 3.5–5.1)
Sodium: 141 mEq/L (ref 135–145)
Total Bilirubin: 0.5 mg/dL (ref 0.2–1.2)
Total Protein: 6.2 g/dL (ref 6.0–8.3)

## 2020-07-06 LAB — LIPID PANEL
Cholesterol: 141 mg/dL (ref 0–200)
HDL: 54.4 mg/dL (ref >39.00)
LDL Cholesterol: 74 mg/dL (ref 0–99)
NonHDL: 86.8
Total CHOL/HDL Ratio: 3
Triglycerides: 65 mg/dL (ref 0.0–149.0)
VLDL: 13 mg/dL (ref 0.0–40.0)

## 2020-07-11 LAB — SARS-COV-2 SEMI-QUANTITATIVE TOTAL ANTIBODY, SPIKE: SARS COV2 AB, Total Spike Semi QN: 1095 U/mL — ABNORMAL HIGH (ref <0.8)

## 2020-09-21 ENCOUNTER — Telehealth: Payer: Self-pay | Admitting: Internal Medicine

## 2020-09-21 MED ORDER — TELMISARTAN 40 MG PO TABS: 40.0000 mg | ORAL_TABLET | Freq: Every day | ORAL | 1 refills | Status: DC

## 2020-09-21 MED ORDER — HYDROCHLOROTHIAZIDE 12.5 MG PO CAPS: 12.5000 mg | ORAL_CAPSULE | Freq: Every day | ORAL | 1 refills | Status: DC

## 2020-09-21 NOTE — Telephone Encounter (Signed)
Pt needs a refill on telmisartan (MICARDIS) 40 MG tablet and hydrochlorothiazide (MICROZIDE) 12.5 MG capsule sent to [facility]

## 2020-09-21 NOTE — Telephone Encounter (Signed)
Medication has been refilled and sent to [facility]

## 2020-09-21 NOTE — Addendum Note (Signed)
Addended by: Ezequiel Ganser on: 09/21/2020 12:08 PM   Modules accepted: Orders

## 2020-10-03 ENCOUNTER — Ambulatory Visit (INDEPENDENT_AMBULATORY_CARE_PROVIDER_SITE_OTHER): Payer: Medicare HMO

## 2020-10-03 VITALS — Ht 64.0 in | Wt 135.0 lb

## 2020-10-03 DIAGNOSIS — Z0000 Encounter for general adult medical examination without abnormal findings: Secondary | ICD-10-CM

## 2020-10-03 NOTE — Patient Instructions (Addendum)
  [name] , Thank you for taking time to come for your Medicare Wellness Visit. I appreciate your ongoing commitment to your health goals. Please review the following plan we discussed and let me know if I can assist you in the future.   These are the goals we discussed:  Goals      Follow up with Primary Care Provider     As needed        This is a list of the screening recommended for you and due dates:  Health Maintenance  Topic Date Due   COVID-19 Vaccine (4 - Booster for Pfizer series) 10/19/2020*   Flu Shot  11/19/2020*   Pneumonia vaccines (2 of 2 - PPSV23) 06/15/2021   Mammogram  03/16/2022   Tetanus Vaccine  10/16/2025   Colon Cancer Screening  10/08/2028   DEXA scan (bone density measurement)  Completed   Hepatitis C Screening: USPSTF Recommendation to screen - Ages 26-79 yo.  Completed   Zoster (Shingles) Vaccine  Completed   HPV Vaccine  Aged Out  *Topic was postponed. The date shown is not the original due date.

## 2020-10-03 NOTE — Progress Notes (Signed)
Subjective:   Terri Melendez is a 70 y.o. female who presents for Medicare Annual (Subsequent) preventive examination.  Review of Systems    No ROS.  Medicare Wellness Virtual Visit.  Visual/audio telehealth visit, UTA vital signs.   See social history for additional risk factors.   Cardiac Risk Factors include: advanced age (>2mn, >>51women);hypertension     Objective:    Today's Vitals   10/03/20 1033  Weight: 135 lb (61.2 kg)  Height: '5\' 4"'$  (1.626 m)   Body mass index is 23.17 kg/m.  Advanced Directives 10/03/2020 10/01/2019 09/20/2019 09/30/2018  Does Patient Have a Medical Advance Directive? Yes Yes Yes Yes  Type of AParamedicof AForsythLiving will HRed Dog MineLiving will - H[place]cordLiving will  Does patient want to make changes to medical advance directive? No - Patient declined No - Patient declined - No - Patient declined  Copy of [place] Chart? Yes - validated most recent copy scanned in chart (See row information) Yes - validated most recent copy scanned in chart (See row information) - No - copy requested    Current Medications (verified) Outpatient Encounter Medications as of 10/03/2020  Medication Sig   atorvastatin (LIPITOR) 20 MG tablet Take 1 tablet (20 mg total) by mouth daily.   hydrochlorothiazide (MICROZIDE) 12.5 MG capsule Take 1 capsule (12.5 mg total) by mouth daily.   levocetirizine (XYZAL) 5 MG tablet Take 5 mg by mouth every evening.   mometasone (ELOCON) 0.1 % lotion Apply topically.   mometasone (NASONEX) 50 MCG/ACT nasal spray Place 2 sprays into the nose daily.   omeprazole (PRILOSEC) 40 MG capsule Take 40 mg by mouth daily.   telmisartan (MICARDIS) 40 MG tablet Take 1 tablet (40 mg total) by mouth daily.   No facility-administered encounter medications on file as of 10/03/2020.    Allergies (verified) Dust mite extract   History: Past Medical History:   Diagnosis Date   Cancer ([place]    basal cell on face in 2000 or 2001   Hx of basal cell carcinoma 2002   right cheek    Hypertension    Osteopenia    Sinus congestion    Past Surgical History:  Procedure Laterality Date   BASAL CELL CARCINOMA EXCISION     TONSILLECTOMY AND ADENOIDECTOMY     Family History  Problem Relation Age of Onset   Cancer Mother        lung   Breast cancer Maternal Aunt    Social History   Socioeconomic History   Marital status: Married    Spouse name: Not on file   Number of children: Not on file   Years of education: Not on file   Highest education level: Not on file  Occupational History   Not on file  Tobacco Use   Smoking status: Never   Smokeless tobacco: Never  Vaping Use   Vaping Use: Never used  Substance and Sexual Activity   Alcohol use: Yes    Comment: on occasion   Drug use: No   Sexual activity: Not on file  Other Topics Concern   Not on file  Social History Narrative   Not on file   Social Determinants of Health   Financial Resource Strain: Low Risk    Difficulty of Paying Living Expenses: Not hard at all  Food Insecurity: No Food Insecurity   Worried About Running Out of Food in the Last Year: Never true  Ran Out of Food in the Last Year: Never true  Transportation Needs: No Transportation Needs   Lack of Transportation (Medical): No   Lack of Transportation (Non-Medical): No  Physical Activity: Sufficiently Active   Days of Exercise per Week: 5 days   Minutes of Exercise per Session: 30 min  Stress: No Stress Concern Present   Feeling of Stress : Not at all  Social Connections: Unknown   Frequency of Communication with Friends and Family: More than three times a week   Frequency of Social Gatherings with Friends and Family: More than three times a week   Attends Religious Services: Not on Electrical engineer or Organizations: Not on file   Attends Archivist Meetings: Not on file   Marital  Status: Not on file    Tobacco Counseling Counseling given: Not Answered   Clinical Intake:  Pre-visit preparation completed: Yes        Diabetes: No  How often do you need to have someone help you when you read instructions, pamphlets, or other written materials from your doctor or pharmacy?: 1 - Never    Interpreter Needed?: No      Activities of Daily Living In your present state of health, do you have any difficulty performing the following activities: 10/03/2020  Hearing? N  Vision? N  Difficulty concentrating or making decisions? N  Walking or climbing stairs? N  Dressing or bathing? N  Doing errands, shopping? N  Preparing Food and eating ? N  Using the Toilet? N  In the past six months, have you accidently leaked urine? N  Do you have problems with loss of bowel control? N  Managing your Medications? N  Managing your Finances? N  Housekeeping or managing your Housekeeping? N  Some recent data might be hidden    Patient Care Team: Crecencio Mc, MD as PCP - General (Internal Medicine)  Indicate any recent Medical Services you may have received from other than [facility] providers in the past year (date may be approximate).     Assessment:   This is a routine wellness examination for Terri Melendez.  I connected with Deshaunda today by telephone and verified that I am speaking with the correct person using two identifiers. Location patient: home Location provider: work Persons participating in the virtual visit: patient, Marine scientist.    I discussed the limitations, risks, security and privacy concerns of performing an evaluation and management service by telephone and the availability of in person appointments. The patient expressed understanding and verbally consented to this telephonic visit.    Interactive audio and video telecommunications were attempted between this provider and patient, however failed, due to patient having technical difficulties OR patient did not  have access to video capability.  We continued and completed visit with audio only.  Some vital signs may be absent or patient reported.   Hearing/Vision screen Hearing Screening - Comments:: Patient is able to hear conversational tones without difficulty.  No issues reported.  Vision Screening - Comments:: Wears corrective lenses Cataract extraction, bilateral They have seen their ophthalmologist in the last 12 months.    Dietary issues and exercise activities discussed: Current Exercise Habits: Home exercise routine, Type of exercise: walking, Time (Minutes): 30, Frequency (Times/Week): 5, Weekly Exercise (Minutes/Week): 150, Intensity: Mild   Goals Addressed             This Visit's Progress    Follow up with Primary Care Provider  As needed       Depression Screen PHQ 2/9 Scores 10/03/2020 06/15/2020 10/01/2019 09/30/2018 10/22/2017 10/17/2015  PHQ - 2 Score 0 0 0 0 0 0  PHQ- 9 Score - 0 - - 1 0    Fall Risk Fall Risk  10/03/2020 06/15/2020 12/14/2019 10/01/2019 12/11/2018  Falls in the past year? 0 0 1 0 0  Number falls in past yr: - - 0 0 -  Injury with Fall? 0 - 0 - -  Follow up - Falls evaluation completed Falls evaluation completed Falls evaluation completed Falls evaluation completed    [place]: Adequate lighting in your home to reduce risk of falls? Yes   ASSISTIVE DEVICES UTILIZED TO PREVENT FALLS: Life alert? No  Use of a cane, walker or w/c? No   TIMED UP AND GO: Was the test performed? No .   Cognitive Function: Patient is alert and oriented x3.  Enjoys playing the piano and other brain stimulating activities.  MMSE - Mini Mental State Exam 10/01/2019  Not completed: Unable to complete     6CIT Screen 09/30/2018  What Year? 0 points  What month? 0 points  What time? 0 points  Count back from 20 0 points  Months in reverse 0 points  Repeat phrase 0 points  Total Score 0    Immunizations Immunization History   Administered Date(s) Administered   Fluad Quad(high Dose 65+) 11/22/2018   Influenza Split 01/13/2016   Influenza-Unspecified 12/13/2016, 12/31/2017   PFIZER(Purple Top)SARS-COV-2 Vaccination 04/10/2019, 05/05/2019, 11/27/2019   PNEUMOCOCCAL CONJUGATE-20 06/15/2020   Pneumococcal Conjugate-13 12/11/2018   Td 06/25/2005   Tdap 10/17/2015   Zoster Recombinat (Shingrix) 12/22/2018, 03/09/2019   Zoster, Live 09/06/2010   Health Maintenance There are no preventive care reminders to display for this patient. Health Maintenance  Topic Date Due   COVID-19 Vaccine (4 - Booster for Pfizer series) 10/19/2020 (Originally 02/27/2020)   INFLUENZA VACCINE  11/19/2020 (Originally 09/26/2020)   PNA vac Low Risk Adult (2 of 2 - PPSV23) 06/15/2021   MAMMOGRAM  03/16/2022   TETANUS/TDAP  10/16/2025   COLONOSCOPY (Pts 45-2yr Insurance coverage will need to be confirmed)  10/08/2028   DEXA SCAN  Completed   Hepatitis C Screening  Completed   Zoster Vaccines- Shingrix  Completed   HPV VACCINES  Aged Out    Lung Cancer Screening: (Low Dose CT Chest recommended if Age 68-80years, 30 pack-year currently smoking OR have quit w/in 15years.) does not qualify.   Vision Screening: Recommended annual ophthalmology exams for early detection of glaucoma and other disorders of the eye.  Dental Screening: Recommended annual dental exams for proper oral hygiene  Community Resource Referral / Chronic Care Management: CRR required this visit?  No   CCM required this visit?  No      Plan:   Keep all routine maintenance appointments.   I have personally reviewed and noted the following in the patient's chart:   Medical and social history Use of alcohol, tobacco or illicit drugs  Current medications and supplements including opioid prescriptions. Patient is not currently taking opioid.  Functional ability and status Nutritional status Physical activity Advanced directives List of other  physicians Hospitalizations, surgeries, and ER visits in previous 12 months Vitals Screenings to include cognitive, depression, and falls Referrals and appointments  In addition, I have reviewed and discussed with patient certain preventive protocols, quality metrics, and best practice recommendations. A written personalized care plan for preventive services as  well as general preventive health recommendations were provided to patient via mychart.     Varney Biles, LPN   [phone]

## 2020-10-17 ENCOUNTER — Ambulatory Visit
Admission: RE | Admit: 2020-10-17 | Discharge: 2020-10-17 | Disposition: A | Discharge status: Home / Self Care | Payer: Medicare HMO | Source: Ambulatory Visit | Attending: Emergency Medicine | Admitting: Emergency Medicine

## 2020-10-17 ENCOUNTER — Other Ambulatory Visit: Payer: Self-pay

## 2020-10-17 ENCOUNTER — Telehealth: Payer: Self-pay | Admitting: Internal Medicine

## 2020-10-17 VITALS — BP 144/72 | HR 86 | Temp 98.5°F | Resp 18

## 2020-10-17 DIAGNOSIS — R21 Rash and other nonspecific skin eruption: Secondary | ICD-10-CM | POA: Diagnosis not present

## 2020-10-17 MED ORDER — PREDNISONE 10 MG (21) PO TBPK
ORAL_TABLET | Freq: Every day | ORAL | 0 refills | Status: DC
Start: 2020-10-17 — End: 2021-06-16

## 2020-10-17 NOTE — Telephone Encounter (Signed)
Patient informed, Due to the high volume of calls and your symptoms we have to forward your call to our Triage Nurse to expedient your call. Please hold for the transfer.  Patient transferred to Access Nurse. Due to having bumps on her hand and wanting advise on what to do and be seen. Advise nothing available for this whole week apt wise in office or virtual.

## 2020-10-17 NOTE — Telephone Encounter (Signed)
Access nurse stated patient needed to be seen within 24 hours. Also they provided home care options.

## 2020-10-17 NOTE — Discharge Instructions (Addendum)
Take the prednisone as directed.    Take Benadryl every 6 hours as directed; do not drive, operate machinery, or drink alcohol with this medication as it may cause drowsiness.  If you need to be awake and alert, take Claritin as directed.    Follow up with your primary care provider if your symptoms are not improving.

## 2020-10-17 NOTE — Telephone Encounter (Signed)
Patient has been evaluated at a [facility].

## 2020-10-17 NOTE — ED Triage Notes (Addendum)
Patient c/o rash x 1 week.   Patient denies fever and drainage.   Patient endorses onset of symptoms began on LFT foot, "then yesterday morning I noticed I was getting bumps on my hands , back, and butt cheeks".   Patient endorses itching upon onset of symptoms.   Patient has used Calamine lotion and alcohol with no relief of symptoms.

## 2020-10-17 NOTE — Telephone Encounter (Signed)
Fyi Terri Melendez call pt  I disagree with triage note to be seen within 24 hours.  Advised her to be seen today if she is concerned of shingles.  I have not seen any cases of monkey pox and in [place] this is more often seen in homosexual men.  Nonetheless if she is concerned for either 1 of these rashes she would need evaluation ASAP.  Please advise that she goes to urgent care such as [facility] or [facility] today

## 2020-10-17 NOTE — ED Provider Notes (Signed)
[facility]    CSN: [id number] Arrival date & time: 10/17/20  1140      History   Chief Complaint Chief Complaint  Patient presents with   Rash   APPT 1200    HPI Terri Melendez is a 70 y.o. female.  Patient presents with "bump" on her left foot x1 week.  Beginning yesterday, she noted similar bumps on her hands.  She thinks she may have some on her lower back and buttocks.  No drainage from rash.  She denies fever, chills, sore throat, cough, or other symptoms.  Patient does not know any cause for the rash; no new products or medications or foods; no exposure to poison oak or ivy.  Treatment with calamine lotion and rubbing alcohol.  Her medical history includes hypertension.  The history is provided by the patient and medical records.   Past Medical History:  Diagnosis Date   Cancer ([place])    basal cell on face in 2000 or 2001   Hx of basal cell carcinoma 2002   right cheek    Hypertension    Osteopenia    Sinus congestion     Patient Active Problem List   Diagnosis Date Noted   Allergic rhinitis 06/17/2020   GERD with esophagitis 06/17/2020   Polyarthralgia 12/15/2019   Thoracic aortic atherosclerosis ([place]) 12/15/2019   Coronary atherosclerosis due to calcified coronary lesion (CODE) 12/15/2019   Encounter for preventive health examination 12/13/2018   History of shingles 05/04/2018   Non-functional thyroid nodule 05/04/2018   Essential hypertension 10/22/2017   Advanced care planning/counseling discussion 10/22/2017   Hypercholesteremia 05/14/2016   OSA on CPAP 10/13/2014    Past Surgical History:  Procedure Laterality Date   BASAL CELL CARCINOMA EXCISION     TONSILLECTOMY AND ADENOIDECTOMY      OB History   No obstetric history on file.      Home Medications    Prior to Admission medications   Medication Sig Start Date End Date Taking? Authorizing Provider  atorvastatin (LIPITOR) 20 MG tablet Take 1 tablet (20 mg total) by mouth  daily. 06/15/20  Yes Crecencio Mc, MD  hydrochlorothiazide (MICROZIDE) 12.5 MG capsule Take 1 capsule (12.5 mg total) by mouth daily. 09/21/20  Yes Crecencio Mc, MD  omeprazole (PRILOSEC) 40 MG capsule Take 40 mg by mouth daily. 08/10/19  Yes [provider]  predniSONE (STERAPRED UNI-PAK 21 TAB) 10 MG (21) TBPK tablet Take by mouth daily. As directed 10/17/20  Yes Sharion Balloon, NP  telmisartan (MICARDIS) 40 MG tablet Take 1 tablet (40 mg total) by mouth daily. 09/21/20  Yes Crecencio Mc, MD  levocetirizine (XYZAL) 5 MG tablet Take 5 mg by mouth every evening.    [provider]  mometasone (ELOCON) 0.1 % lotion Apply topically. 12/11/19   [provider]  mometasone (NASONEX) 50 MCG/ACT nasal spray Place 2 sprays into the nose daily.    [provider]    Family History Family History  Problem Relation Age of Onset   Cancer Mother        lung   Breast cancer Maternal Aunt     Social History Social History   Tobacco Use   Smoking status: Never   Smokeless tobacco: Never  Vaping Use   Vaping Use: Never used  Substance Use Topics   Alcohol use: Yes    Comment: on occasion   Drug use: No     Allergies   Dust mite  extract   Review of Systems Review of Systems  Constitutional:  Negative for chills and fever.  Respiratory:  Negative for cough and shortness of breath.   Cardiovascular:  Negative for chest pain and palpitations.  Skin:  Positive for rash. Negative for color change.  All other systems reviewed and are negative.   Physical Exam Triage Vital Signs ED Triage Vitals  Enc Vitals Group     BP      Pulse      Resp      Temp      Temp src      SpO2      Weight      Height      Head Circumference      Peak Flow      Pain Score      Pain Loc      Pain Edu?      Excl. in [place]?    No data found.  Updated Vital Signs BP (!) 144/72 (BP Location: Left Arm)   Pulse 86   Temp 98.5 F (36.9 C) (Oral)   Resp 18    SpO2 98%   Visual Acuity Right Eye Distance:   Left Eye Distance:   Bilateral Distance:    Right Eye Near:   Left Eye Near:    Bilateral Near:     Physical Exam Vitals and nursing note reviewed.  Constitutional:      General: She is not in acute distress.    Appearance: She is well-developed. She is not ill-appearing.  HENT:     Head: Normocephalic and atraumatic.     Mouth/Throat:     Mouth: Mucous membranes are moist.     Pharynx: Oropharynx is clear.  Eyes:     Conjunctiva/sclera: Conjunctivae normal.  Cardiovascular:     Rate and Rhythm: Normal rate and regular rhythm.     Heart sounds: Normal heart sounds.  Pulmonary:     Effort: Pulmonary effort is normal. No respiratory distress.     Breath sounds: Normal breath sounds.  Abdominal:     Palpations: Abdomen is soft.     Tenderness: There is no abdominal tenderness.  Musculoskeletal:     Cervical back: Neck supple.  Skin:    General: Skin is warm and dry.     Findings: Rash present.     Comments: Red papular rash in various stages of healing on both hands and left foot.  No lesions on trunk.  No drainage.  Neurological:     General: No focal deficit present.     Mental Status: She is alert and oriented to person, place, and time.     Gait: Gait normal.  Psychiatric:        Mood and Affect: Mood normal.        Behavior: Behavior normal.     UC Treatments / Results  Labs (all labs ordered are listed, but only abnormal results are displayed) Labs Reviewed - No data to display  EKG   Radiology No results found.  Procedures Procedures (including critical care time)  Medications Ordered in UC Medications - No data to display  Initial Impression / Assessment and Plan / UC Course  I have reviewed the triage vital signs and the nursing notes.  Pertinent labs & imaging results that were available during my care of the patient were reviewed by me and considered in my medical decision making (see chart for  details).   Rash.  Treating with prednisone  taper and Benadryl.  Precautions for drowsiness with Benadryl discussed.  Instructed patient to follow-up with her PCP if her symptoms are not improving.  She agrees to plan of care.   Final Clinical Impressions(s) / UC Diagnoses   Final diagnoses:  Rash     Discharge Instructions      Take the prednisone as directed.    Take Benadryl every 6 hours as directed; do not drive, operate machinery, or drink alcohol with this medication as it may cause drowsiness.  If you need to be awake and alert, take Claritin as directed.    Follow up with your primary care provider if your symptoms are not improving.         ED Prescriptions     Medication Sig Dispense Auth. Provider   predniSONE (STERAPRED UNI-PAK 21 TAB) 10 MG (21) TBPK tablet Take by mouth daily. As directed 21 tablet Sharion Balloon, NP      PDMP not reviewed this encounter.   Sharion Balloon, NP 10/17/20 [phone]

## 2020-11-12 ENCOUNTER — Ambulatory Visit: Admit: 2020-11-12 | Payer: Medicare HMO

## 2020-11-14 ENCOUNTER — Telehealth: Payer: Self-pay

## 2020-11-14 NOTE — Telephone Encounter (Addendum)
Access nurse instructed patient to be evaluated in the ED. Patient declined and wished to be seen in our office.

## 2020-11-14 NOTE — Telephone Encounter (Signed)
Spoken to patient. Appointment has been scheduled for tomorrow at 17 in office.

## 2020-11-15 ENCOUNTER — Other Ambulatory Visit: Payer: Self-pay

## 2020-11-15 ENCOUNTER — Ambulatory Visit (INDEPENDENT_AMBULATORY_CARE_PROVIDER_SITE_OTHER): Payer: Medicare HMO | Admitting: Internal Medicine

## 2020-11-15 ENCOUNTER — Ambulatory Visit (INDEPENDENT_AMBULATORY_CARE_PROVIDER_SITE_OTHER): Payer: Medicare HMO

## 2020-11-15 VITALS — BP 126/64 | HR 79 | Temp 97.8°F | Ht 64.0 in | Wt 134.8 lb

## 2020-11-15 DIAGNOSIS — R194 Change in bowel habit: Secondary | ICD-10-CM | POA: Diagnosis not present

## 2020-11-15 DIAGNOSIS — Z87898 Personal history of other specified conditions: Secondary | ICD-10-CM | POA: Diagnosis not present

## 2020-11-15 DIAGNOSIS — Z23 Encounter for immunization: Secondary | ICD-10-CM

## 2020-11-15 DIAGNOSIS — R42 Dizziness and giddiness: Secondary | ICD-10-CM | POA: Diagnosis not present

## 2020-11-15 DIAGNOSIS — R110 Nausea: Secondary | ICD-10-CM | POA: Diagnosis not present

## 2020-11-15 LAB — CBC WITH DIFFERENTIAL/PLATELET
Basophils Absolute: 0 10*3/uL (ref 0.0–0.1)
Basophils Relative: 0.5 % (ref 0.0–3.0)
Eosinophils Absolute: 0.1 10*3/uL (ref 0.0–0.7)
Eosinophils Relative: 2.5 % (ref 0.0–5.0)
HCT: 34.5 % — ABNORMAL LOW (ref 36.0–46.0)
Hemoglobin: 11.6 g/dL — ABNORMAL LOW (ref 12.0–15.0)
Lymphocytes Relative: 25.3 % (ref 12.0–46.0)
Lymphs Abs: 1.5 10*3/uL (ref 0.7–4.0)
MCHC: 33.5 g/dL (ref 30.0–36.0)
MCV: 90 fl (ref 78.0–100.0)
Monocytes Absolute: 0.4 10*3/uL (ref 0.1–1.0)
Monocytes Relative: 7.7 % (ref 3.0–12.0)
Neutro Abs: 3.7 10*3/uL (ref 1.4–7.7)
Neutrophils Relative %: 64 % (ref 43.0–77.0)
Platelets: 287 10*3/uL (ref 150.0–400.0)
RBC: 3.84 Mil/uL — ABNORMAL LOW (ref 3.87–5.11)
RDW: 13.4 % (ref 11.5–15.5)
WBC: 5.8 10*3/uL (ref 4.0–10.5)

## 2020-11-15 LAB — COMPREHENSIVE METABOLIC PANEL
ALT: 14 U/L (ref 0–35)
AST: 18 U/L (ref 0–37)
Albumin: 4.1 g/dL (ref 3.5–5.2)
Alkaline Phosphatase: 87 U/L (ref 39–117)
BUN: 18 mg/dL (ref 6–23)
CO2: 27 mEq/L (ref 19–32)
Calcium: 9.3 mg/dL (ref 8.4–10.5)
Chloride: 104 mEq/L (ref 96–112)
Creatinine, Ser: 0.79 mg/dL (ref 0.40–1.20)
GFR: 77 mL/min (ref >60.00)
Glucose, Bld: 88 mg/dL (ref 70–99)
Potassium: 3.8 mEq/L (ref 3.5–5.1)
Sodium: 139 mEq/L (ref 135–145)
Total Bilirubin: 0.5 mg/dL (ref 0.2–1.2)
Total Protein: 6.8 g/dL (ref 6.0–8.3)

## 2020-11-15 LAB — SEDIMENTATION RATE: Sed Rate: 3 mm/hr (ref 0–30)

## 2020-11-15 LAB — LIPASE: Lipase: 6 U/L — ABNORMAL LOW (ref 11.0–59.0)

## 2020-11-15 NOTE — Progress Notes (Signed)
Subjective:  Patient ID: Terri Melendez, female    DOB: 05/31/1950  Age: 70 y.o. MRN: [id number]  CC: The primary encounter diagnosis was Nausea. Diagnoses of Change in bowel habits, History of diarrhea, Light-headed feeling, and Need for immunization against influenza were also pertinent to this visit.  HPI Terri Melendez presents for evaluation of new onset nausea  and ltered BMs's   Chief Complaint  Patient presents with   Nausea  This visit occurred during the SARS-CoV-2 public health emergency.  Safety protocols were in place, including screening questions prior to the visit, additional usage of staff PPE, and extensive cleaning of exam room while observing appropriate contact time as indicated for disinfecting solutions.    Reports that the nausea STARTED TWO WEEKS AGO and started after and an explosive large  bowel movement that started out solid and ended in liquid stool .  The formed stool was  tan colored. .Had eaten at [facility] with husband the day before.  Same occurrence the follow day.  Then no Stools for  days.  During this time she had intermittent nausea without vomiting.  Simplified diet during that period.  Tried to resume a normal diet after a few days, but has intermittent episodes formed stools of normal color,  and some stools have been suggestive of constipation.  Appetite has not returned to normal.  Also having new onset "heartburn"  (with signs of GERD by ENT exam  several years ago. Not febrile,  no abd cramping, urine is light colored.Marland Kitchen episodes of feeling light headed occur when she has a "weak " feeling in the stomach.   Not using NSAIDs,  rare alcohol use. Felt "some" better last week while at the beach.    Last colonoscopy was  August 2020 was normal.   Last Simpson 2021 .    Going on a cruise in 2 weeks   Taking omeprazole for years.    of as been intermittent accompanied by "unusual BM" and light headedness .  No new meds.   Started taking atrovastatin at dinner time due to interference with sleep   Outpatient Medications Prior to Visit  Medication Sig Dispense Refill   atorvastatin (LIPITOR) 20 MG tablet Take 1 tablet (20 mg total) by mouth daily. 90 tablet 1   hydrochlorothiazide (MICROZIDE) 12.5 MG capsule Take 1 capsule (12.5 mg total) by mouth daily. 90 capsule 1   levocetirizine (XYZAL) 5 MG tablet Take 5 mg by mouth every evening.     mometasone (ELOCON) 0.1 % lotion Apply topically.     mometasone (NASONEX) 50 MCG/ACT nasal spray Place 2 sprays into the nose daily.     omeprazole (PRILOSEC) 40 MG capsule Take 40 mg by mouth daily.     predniSONE (STERAPRED UNI-PAK 21 TAB) 10 MG (21) TBPK tablet Take by mouth daily. As directed 21 tablet 0   telmisartan (MICARDIS) 40 MG tablet Take 1 tablet (40 mg total) by mouth daily. 90 tablet 1   No facility-administered medications prior to visit.    Review of Systems;  Patient denies headache, fevers, malaise, unintentional weight loss, skin rash, eye pain, sinus congestion and sinus pain, sore throat, dysphagia,  hemoptysis , cough, dyspnea, wheezing, chest pain, palpitations, orthopnea, edema, abdominal pain,  melena, diarrhea,  flank pain, dysuria, hematuria, urinary  Frequency, nocturia, numbness, tingling, seizures,  Focal weakness, Loss of consciousness,  Tremor, insomnia, depression, anxiety, and suicidal ideation.      Objective:  BP 126/64  Pulse 79   Temp 97.8 F (36.6 C) (Skin)   Ht 5\' 4"  (1.626 m)   Wt 134 lb 12.8 oz (61.1 kg)   SpO2 99%   BMI 23.14 kg/m   BP Readings from Last 3 Encounters:  11/15/20 126/64  10/17/20 (!) 144/72  06/15/20 (!) 120/52    Wt Readings from Last 3 Encounters:  11/15/20 134 lb 12.8 oz (61.1 kg)  10/03/20 135 lb (61.2 kg)  06/15/20 135 lb 9.6 oz (61.5 kg)    General appearance: alert, cooperative and appears stated age Ears: normal TM's and external ear canals both ears Throat: lips, mucosa, and tongue  normal; teeth and gums normal Neck: no adenopathy, no carotid bruit, supple, symmetrical, trachea midline and thyroid not enlarged, symmetric, no tenderness/mass/nodules Back: symmetric, no curvature. ROM normal. No CVA tenderness. Lungs: clear to auscultation bilaterally Heart: regular rate and rhythm, S1, S2 normal, no murmur, click, rub or gallop Abdomen: soft, non-tender; bowel sounds normal; no masses,  no organomegaly Pulses: 2+ and symmetric Skin: Skin color, texture, turgor normal. No rashes or lesions Lymph nodes: Cervical, supraclavicular, and axillary nodes normal.  No results found for: HGBA1C  Lab Results  Component Value Date   CREATININE 0.78 07/06/2020   CREATININE 0.77 06/15/2020   CREATININE 0.82 01/12/2020    Lab Results  Component Value Date   WBC 11.6 (H) 09/20/2019   HGB 11.9 (L) 09/20/2019   HCT 36.6 09/20/2019   PLT 233 09/20/2019   GLUCOSE 90 07/06/2020   CHOL 141 07/06/2020   TRIG 65.0 07/06/2020   HDL 54.40 07/06/2020   LDLCALC 74 07/06/2020   ALT 13 07/06/2020   AST 15 07/06/2020   NA 141 07/06/2020   K 3.8 07/06/2020   CL 104 07/06/2020   CREATININE 0.78 07/06/2020   BUN 21 07/06/2020   CO2 30 07/06/2020   TSH 0.99 12/14/2019   MICROALBUR <0.7 12/14/2019    No results found.  Assessment & Plan:   Problem List Items Addressed This Visit       Unprioritized   Nausea - Primary    Suspect that her intermittent symptoms are due to resolving viral infection, but may be due to atorvastatin which she has started taking at dinnertime      Relevant Orders   Comprehensive metabolic panel   Lipase   Light-headed feeling   Relevant Orders   CBC with Differential/Platelet   Change in bowel habits    Advised to resume benefiber daily.  Plain film ordered and colonscopy report requested to look for record of diverticulosis      Relevant Orders   Sedimentation rate   DG Abd 1 View   Other Visit Diagnoses     History of diarrhea        Relevant Orders   SARS-CoV-2 Semi-Quantitative Total Antibody, Spike   DG Abd 1 View   Need for immunization against influenza       Relevant Orders   Flu Vaccine QUAD High Dose(Fluad) (Completed)       I am having Terri Melendez "Terri Melendez" maintain her mometasone, levocetirizine, omeprazole, mometasone, atorvastatin, telmisartan, hydrochlorothiazide, and predniSONE.  No orders of the defined types were placed in this encounter.   There are no discontinued medications.  Follow-up: No follow-ups on file.   Crecencio Mc, MD

## 2020-11-15 NOTE — Progress Notes (Signed)
Pre visit review using our clinic review tool, if applicable. No additional management support is needed unless otherwise documented below in the visit note. 

## 2020-11-15 NOTE — Patient Instructions (Signed)
I THINK YOU HAD SOME TYPE OF MILD VIRAL INFECTION THAT CAUSED YOUR SYMPTOMS  IF THE COVID ANTIBODY LEVEL IS LOWER THAN IN MAY,  GO GET YOUR VACCINE ASAP   If it is higher,  I would wait until after your cruise   Ok to resume benefiber  daily to get bowels back on track

## 2020-11-15 NOTE — Assessment & Plan Note (Signed)
Suspect that her intermittent symptoms are due to resolving viral infection, but may be due to atorvastatin which she has started taking at dinnertime

## 2020-11-15 NOTE — Assessment & Plan Note (Signed)
Advised to resume benefiber daily.  Plain film ordered and colonscopy report requested to look for record of diverticulosis

## 2020-11-16 ENCOUNTER — Ambulatory Visit: Payer: Medicare HMO | Admitting: Family Medicine

## 2020-11-18 LAB — SARS-COV-2 SEMI-QUANTITATIVE TOTAL ANTIBODY, SPIKE: SARS COV2 AB, Total Spike Semi QN: 544.2 U/mL — ABNORMAL HIGH (ref <0.8)

## 2020-11-22 MED ORDER — ATORVASTATIN CALCIUM 20 MG PO TABS: 20.0000 mg | ORAL_TABLET | Freq: Every day | ORAL | 3 refills | Status: DC

## 2020-12-16 ENCOUNTER — Other Ambulatory Visit: Payer: Self-pay

## 2020-12-16 ENCOUNTER — Encounter: Payer: Self-pay | Admitting: Internal Medicine

## 2020-12-16 ENCOUNTER — Ambulatory Visit (INDEPENDENT_AMBULATORY_CARE_PROVIDER_SITE_OTHER): Payer: Medicare HMO | Admitting: Internal Medicine

## 2020-12-16 VITALS — BP 128/60 | HR 75 | Temp 96.5°F | Ht 64.02 in | Wt 140.0 lb

## 2020-12-16 DIAGNOSIS — R194 Change in bowel habit: Secondary | ICD-10-CM | POA: Diagnosis not present

## 2020-12-16 DIAGNOSIS — I700 Atherosclerosis of aorta: Secondary | ICD-10-CM

## 2020-12-16 DIAGNOSIS — I10 Essential (primary) hypertension: Secondary | ICD-10-CM | POA: Diagnosis not present

## 2020-12-16 DIAGNOSIS — Z0000 Encounter for general adult medical examination without abnormal findings: Secondary | ICD-10-CM | POA: Diagnosis not present

## 2020-12-16 LAB — COMPREHENSIVE METABOLIC PANEL
ALT: 16 U/L (ref 0–35)
AST: 17 U/L (ref 0–37)
Albumin: 4 g/dL (ref 3.5–5.2)
Alkaline Phosphatase: 80 U/L (ref 39–117)
BUN: 17 mg/dL (ref 6–23)
CO2: 30 mEq/L (ref 19–32)
Calcium: 9.1 mg/dL (ref 8.4–10.5)
Chloride: 106 mEq/L (ref 96–112)
Creatinine, Ser: 0.72 mg/dL (ref 0.40–1.20)
GFR: 86.01 mL/min (ref >60.00)
Glucose, Bld: 76 mg/dL (ref 70–99)
Potassium: 3.9 mEq/L (ref 3.5–5.1)
Sodium: 141 mEq/L (ref 135–145)
Total Bilirubin: 0.5 mg/dL (ref 0.2–1.2)
Total Protein: 5.9 g/dL — ABNORMAL LOW (ref 6.0–8.3)

## 2020-12-16 NOTE — Patient Instructions (Addendum)
We can combine your telmisartan and hctz into one daily pill if you prefer,  but keeping them separate allows you to suspend the hctz during GI illnesses that might get you really dehydrated   No med changes today  See you in 6 months!

## 2020-12-16 NOTE — Progress Notes (Signed)
Patient ID: GIA LUSHER, female    DOB: 21-May-1950  Age: 70 y.o. MRN: [id number]  The patient is here for annual preventive  examination and management of other chronic and acute problems.   The risk factors are reflected in the social history.  The roster of all physicians providing medical care to patient - is listed in the Snapshot section of the chart.  Activities of daily living:  The patient is 100% independent in all ADLs: dressing, toileting, feeding as well as independent mobility  Home safety : The patient has smoke detectors in the home. They wear seatbelts.  There are no firearms at home. There is no violence in the home.   There is no risks for hepatitis, STDs or HIV. There is no   history of blood transfusion. They have no travel history to infectious disease endemic areas of the world.  The patient has seen their dentist in the last six month. They have seen their eye doctor in the last year. They admit to slight hearing difficulty with regard to whispered voices and some television programs.  They have deferred audiologic testing in the last year.  They do not  have excessive sun exposure. Discussed the need for sun protection: hats, long sleeves and use of sunscreen if there is significant sun exposure.   Diet: the importance of a healthy diet is discussed. They do have a healthy diet.  The benefits of regular aerobic exercise were discussed. She walks 4 times per week ,  20 minutes.   Depression screen: there are no signs or vegative symptoms of depression- irritability, change in appetite, anhedonia, sadness/tearfullness.  Cognitive assessment: the patient manages all their financial and personal affairs and is actively engaged. They could relate day,date,year and events; recalled 2/3 objects at 3 minutes; performed clock-face test normally.  The following portions of the patient's history were reviewed and updated as appropriate: allergies, current medications, past  family history, past medical history,  past surgical history, past social history  and problem list.  Visual acuity was not assessed per patient preference since she has regular follow up with her ophthalmologist. Hearing and body mass index were assessed and reviewed.   During the course of the visit the patient was educated and counseled about appropriate screening and preventive services including : fall prevention , diabetes screening, nutrition counseling, colorectal cancer screening, and recommended immunizations.    CC: The primary encounter diagnosis was Hypertension, unspecified type. Diagnoses of Thoracic aortic atherosclerosis ([place]), Change in bowel habits, and Encounter for preventive health examination were also pertinent to this visit.  1) stomach problems have resolved using bulk forming  laxative .  Reviewed Colonoscopy August 2020 by [facility]   10 yr follow  UP  PER  patient STILL WAITING FOR REPORT   2) WALKING FOR EXERCISE.     3) nocturia 1-2.    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   History Hoorain has a past medical history of Cancer ([place]), basal cell carcinoma (2002), Hypertension, Osteopenia, and Sinus congestion.   She has a past surgical history that includes Tonsillectomy and adenoidectomy and Excision basal cell carcinoma.   Her family history includes Breast cancer in her maternal aunt; Cancer in her mother.She reports that she has never smoked. She has never used smokeless tobacco. She reports current alcohol use. She reports that she does not use drugs.  Outpatient Medications Prior to Visit  Medication Sig Dispense Refill  atorvastatin (LIPITOR) 20 MG tablet Take 1 tablet (20 mg total) by mouth daily. 90 tablet 3   hydrochlorothiazide (MICROZIDE) 12.5 MG capsule Take 1 capsule (12.5 mg total) by mouth daily. 90 capsule 1   levocetirizine (XYZAL) 5 MG tablet Take 5 mg by mouth every evening.     mometasone (ELOCON)  0.1 % lotion Apply topically.     mometasone (NASONEX) 50 MCG/ACT nasal spray Place 2 sprays into the nose daily.     omeprazole (PRILOSEC) 40 MG capsule Take 40 mg by mouth daily.     telmisartan (MICARDIS) 40 MG tablet Take 1 tablet (40 mg total) by mouth daily. 90 tablet 1   predniSONE (STERAPRED UNI-PAK 21 TAB) 10 MG (21) TBPK tablet Take by mouth daily. As directed (Patient not taking: Reported on 12/16/2020) 21 tablet 0   No facility-administered medications prior to visit.    Review of Systems  Patient denies headache, fevers, malaise, unintentional weight loss, skin rash, eye pain, sinus congestion and sinus pain, sore throat, dysphagia,  hemoptysis , cough, dyspnea, wheezing, chest pain, palpitations, orthopnea, edema, abdominal pain, nausea, melena, diarrhea, constipation, flank pain, dysuria, hematuria, urinary  Frequency, nocturia, numbness, tingling, seizures,  Focal weakness, Loss of consciousness,  Tremor, insomnia, depression, anxiety, and suicidal ideation.     Objective:  BP 128/60   Pulse 75   Temp (!) 96.5 F (35.8 C)   Ht 5' 4.02" (1.626 m)   Wt 140 lb (63.5 kg)   SpO2 99%   BMI 24.02 kg/m   Physical Exam   General appearance: alert, cooperative and appears stated age Head: Normocephalic, without obvious abnormality, atraumatic Eyes: conjunctivae/corneas clear. PERRL, EOM's intact. Fundi benign. Ears: normal TM's and external ear canals both ears Nose: Nares normal. Septum midline. Mucosa normal. No drainage or sinus tenderness. Throat: lips, mucosa, and tongue normal; teeth and gums normal Neck: no adenopathy, no carotid bruit, no JVD, supple, symmetrical, trachea midline and thyroid not enlarged, symmetric, no tenderness/mass/nodules Lungs: clear to auscultation bilaterally Breasts: normal appearance, no masses or tenderness Heart: regular rate and rhythm, S1, S2 normal, no murmur, click, rub or gallop Abdomen: soft, non-tender; bowel sounds normal; no  masses,  no organomegaly Extremities: extremities normal, atraumatic, no cyanosis or edema Pulses: 2+ and symmetric Skin: Skin color, texture, turgor normal. No rashes or lesions Neurologic: Alert and oriented X 3, normal strength and tone. Normal symmetric reflexes. Normal coordination and gait.     Assessment & Plan:   Problem List Items Addressed This Visit     Encounter for preventive health examination    age appropriate education and counseling updated, referrals for preventative services and immunizations addressed, dietary and smoking counseling addressed, most recent labs reviewed.  I have personally reviewed and have noted:   1) the patient's medical and social history 2) The pt's use of alcohol, tobacco, and illicit drugs 3) The patient's current medications and supplements 4) Functional ability including ADL's, fall risk, home safety risk, hearing and visual impairment 5) Diet and physical activities 6) Evidence for depression or mood disorder 7) The patient's height, weight, and BMI have been recorded in the chart   I have made referrals, and provided counseling and education based on review of the above      Thoracic aortic atherosclerosis ([place])    Noted on CT.  Statin therapy initiated and tolerated.   Lab Results  Component Value Date   CHOL 141 07/06/2020   HDL 54.40 07/06/2020   LDLCALC 74 07/06/2020  TRIG 65.0 07/06/2020   CHOLHDL 3 07/06/2020         Change in bowel habits    Improved with daily use of bulk forming laxative'      Other Visit Diagnoses     Hypertension, unspecified type    -  Primary   Relevant Orders   Comprehensive metabolic panel (Completed)       I am having [name] "[name]" maintain her mometasone, levocetirizine, omeprazole, mometasone, telmisartan, hydrochlorothiazide, predniSONE, and atorvastatin.  No orders of the defined types were placed in this encounter.   There are no discontinued  medications.  Follow-up: No follow-ups on file.   Crecencio Mc, MD

## 2020-12-18 NOTE — Assessment & Plan Note (Signed)
Improved with daily use of bulk forming laxative'

## 2020-12-18 NOTE — Assessment & Plan Note (Signed)
Noted on CT.  Statin therapy initiated and tolerated.   Lab Results  Component Value Date   CHOL 141 07/06/2020   HDL 54.40 07/06/2020   LDLCALC 74 07/06/2020   TRIG 65.0 07/06/2020   CHOLHDL 3 07/06/2020

## 2020-12-18 NOTE — Assessment & Plan Note (Signed)

## 2020-12-23 DIAGNOSIS — Z1231 Encounter for screening mammogram for malignant neoplasm of breast: Secondary | ICD-10-CM

## 2020-12-23 NOTE — Telephone Encounter (Signed)
MAMMOGRAM ORDERED

## 2021-01-17 ENCOUNTER — Encounter: Payer: Self-pay | Admitting: Family

## 2021-01-17 ENCOUNTER — Telehealth (INDEPENDENT_AMBULATORY_CARE_PROVIDER_SITE_OTHER): Payer: Medicare HMO | Admitting: Family

## 2021-01-17 ENCOUNTER — Other Ambulatory Visit: Payer: Self-pay

## 2021-01-17 VITALS — Temp 100.1°F

## 2021-01-17 DIAGNOSIS — U071 COVID-19: Secondary | ICD-10-CM

## 2021-01-17 DIAGNOSIS — R52 Pain, unspecified: Secondary | ICD-10-CM | POA: Diagnosis not present

## 2021-01-17 NOTE — Progress Notes (Signed)
Virtual Visit via Telephone Note  I connected with Terri Melendez on 01/17/21 at 10:45 AM EST by telephone and verified that I am speaking with the correct person using two identifiers.  Location: Patient: Home Provider: [employer]    I discussed the limitations, risks, security and privacy concerns of performing an evaluation and management service by telephone and the availability of in person appointments. I also discussed with the patient that there may be a patient responsible charge related to this service. The patient expressed understanding and agreed to proceed.   History of Present Illness: 70 year old female presents with concerns of sore throat, body aches, congestion, and cough x 3 days. She reports taking a home COVID-19 test that was positive. She is vaccinated and boosted but does not wear masks in public.     Observations/Objective: A&O, NAD   Assessment and Plan: Terri Melendez was seen today for covid positive.  Diagnoses and all orders for this visit:  Positive self-administered antigen test for COVID-19  Body aches     Follow Up Instructions: Discussed the benefits vs challenges of Paxlovid. Patient has elected to forgo Paxlovid. I support her decision since she is feeling some better today and has mild illness. Call the office if symptoms worsen or    I discussed the assessment and treatment plan with the patient. The patient was provided an opportunity to ask questions and all were answered. The patient agreed with the plan and demonstrated an understanding of the instructions.   The patient was advised to call back or seek an in-person evaluation if the symptoms worsen or if the condition fails to improve as anticipated.  I provided 20 minutes of non-face-to-face time during this encounter.   Kennyth Arnold, FNP;

## 2021-01-25 ENCOUNTER — Telehealth: Payer: Self-pay

## 2021-01-25 ENCOUNTER — Encounter: Payer: Self-pay | Admitting: Internal Medicine

## 2021-01-25 NOTE — Telephone Encounter (Signed)
Replied to mychart message.

## 2021-01-25 NOTE — Telephone Encounter (Signed)
I had a virtual visit with Terri Melendez on 11/22.  I did a home Covid test that day that said it was positive.  I have not had any fever since Fri., 11/25.  I still tested positive today  (11/30) on another home test.   Did she tell me that I could still test positive on some other kind of test up to three months later?  I can't remember exactly what she said.

## 2021-01-31 MED ORDER — IPRATROPIUM BROMIDE 0.06 % NA SOLN: 2.0000 | Freq: Four times a day (QID) | NASAL | 12 refills | Status: DC

## 2021-01-31 NOTE — Telephone Encounter (Signed)
My Chart message sent

## 2021-01-31 NOTE — Addendum Note (Signed)
Addended by: Crecencio Mc on: 01/31/2021 09:54 AM   Modules accepted: Orders

## 2021-02-13 ENCOUNTER — Ambulatory Visit: Payer: Medicare HMO | Admitting: Dermatology

## 2021-02-13 ENCOUNTER — Other Ambulatory Visit: Payer: Self-pay

## 2021-02-13 DIAGNOSIS — L609 Nail disorder, unspecified: Secondary | ICD-10-CM | POA: Diagnosis not present

## 2021-02-13 DIAGNOSIS — Z85828 Personal history of other malignant neoplasm of skin: Secondary | ICD-10-CM

## 2021-02-13 DIAGNOSIS — D1800 Hemangioma unspecified site: Secondary | ICD-10-CM

## 2021-02-13 DIAGNOSIS — L578 Other skin changes due to chronic exposure to nonionizing radiation: Secondary | ICD-10-CM

## 2021-02-13 DIAGNOSIS — D489 Neoplasm of uncertain behavior, unspecified: Secondary | ICD-10-CM | POA: Diagnosis not present

## 2021-02-13 DIAGNOSIS — D229 Melanocytic nevi, unspecified: Secondary | ICD-10-CM

## 2021-02-13 DIAGNOSIS — L821 Other seborrheic keratosis: Secondary | ICD-10-CM

## 2021-02-13 DIAGNOSIS — B353 Tinea pedis: Secondary | ICD-10-CM | POA: Diagnosis not present

## 2021-02-13 DIAGNOSIS — Z1283 Encounter for screening for malignant neoplasm of skin: Secondary | ICD-10-CM

## 2021-02-13 DIAGNOSIS — L814 Other melanin hyperpigmentation: Secondary | ICD-10-CM

## 2021-02-13 MED ORDER — KETOCONAZOLE 2 % EX CREA
TOPICAL_CREAM | CUTANEOUS | 1 refills | Status: DC
Start: 2021-02-13 — End: 2022-10-10

## 2021-02-13 NOTE — Patient Instructions (Addendum)

## 2021-02-13 NOTE — Progress Notes (Signed)
Follow-Up Visit   Subjective  Terri Melendez is a 70 y.o. female who presents for the following: Annual Exam (Hx BCC - patient having issues with L middle finger ridging and splitting. ). The patient presents for Total-Body Skin Exam (TBSE) for skin cancer screening and mole check.  The patient has spots, moles and lesions to be evaluated, some may be new or changing.  The following portions of the chart were reviewed this encounter and updated as appropriate:   Tobacco   Allergies   Meds   Problems   Med Hx   Surg Hx   Fam Hx      Review of Systems:  No other skin or systemic complaints except as noted in HPI or Assessment and Plan.  Objective  Well appearing patient in no apparent distress; mood and affect are within normal limits.  A full examination was performed including scalp, head, eyes, ears, nose, lips, neck, chest, axillae, abdomen, back, buttocks, bilateral upper extremities, bilateral lower extremities, hands, feet, fingers, toes, fingernails, and toenails. All findings within normal limits unless otherwise noted below.  R back above the braline 0.5 cm Erythematous papule.      L middle finger Ridging of the nails.   B/L foot Scaling and maceration web spaces and over distal and lateral soles.    Assessment & Plan  Neoplasm of uncertain behavior R back above the braline Acne papule vs other - recheck at f/u and biopsy if not resolved.  Nail problem L middle finger Likely due to trauma of the nail matrix -  Recommend OTC Dermanail lacquer QW.   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  ketoconazole (NIZORAL) 2 % cream - B/L foot Apply to the feet QHS.  Skin cancer screening  Lentigines - Scattered tan macules - Due to sun exposure - Benign-appearing, observe - Recommend daily broad spectrum sunscreen SPF 30+ to sun-exposed areas, reapply every 2 hours as needed. - Call for any  changes  Seborrheic Keratoses - Stuck-on, waxy, tan-brown papules and/or plaques  - Benign-appearing - Discussed benign etiology and prognosis. - Observe - Call for any changes  Melanocytic Nevi - Tan-brown and/or pink-flesh-colored symmetric macules and papules - Benign appearing on exam today - Observation - Call clinic for new or changing moles - Recommend daily use of broad spectrum spf 30+ sunscreen to sun-exposed areas.   Hemangiomas - Red papules - Discussed benign nature - Observe - Call for any changes  Actinic Damage - Chronic condition, secondary to cumulative UV/sun exposure - diffuse scaly erythematous macules with underlying dyspigmentation - Recommend daily broad spectrum sunscreen SPF 30+ to sun-exposed areas, reapply every 2 hours as needed.  - Staying in the shade or wearing long sleeves, sun glasses (UVA+UVB protection) and wide brim hats (4-inch brim around the entire circumference of the hat) are also recommended for sun protection.  - Call for new or changing lesions.  History of Basal Cell Carcinoma of the Skin - No evidence of recurrence today - Recommend regular full body skin exams - Recommend daily broad spectrum sunscreen SPF 30+ to sun-exposed areas, reapply every 2 hours as needed.  - Call if any new or changing lesions are noted between office visits  Skin cancer screening performed today.  Return in about 1 year (around 02/13/2022) for TBSE; 2 mths tinea and back f/u .  Luther Redo, CMA, am acting as scribe for Sarina Ser, MD .  Documentation: I have reviewed the above documentation for accuracy and completeness, and I agree with the above.  Sarina Ser, MD

## 2021-02-16 ENCOUNTER — Encounter: Payer: Self-pay | Admitting: Dermatology

## 2021-03-20 ENCOUNTER — Other Ambulatory Visit: Payer: Self-pay | Admitting: Internal Medicine

## 2021-03-20 ENCOUNTER — Other Ambulatory Visit: Payer: Self-pay

## 2021-03-20 ENCOUNTER — Ambulatory Visit
Admission: RE | Admit: 2021-03-20 | Discharge: 2021-03-20 | Disposition: A | Discharge status: Home / Self Care | Payer: Medicare HMO | Source: Ambulatory Visit | Attending: Internal Medicine | Admitting: Internal Medicine

## 2021-03-20 DIAGNOSIS — Z1231 Encounter for screening mammogram for malignant neoplasm of breast: Secondary | ICD-10-CM | POA: Insufficient documentation

## 2021-03-20 DIAGNOSIS — N6489 Other specified disorders of breast: Secondary | ICD-10-CM

## 2021-03-20 DIAGNOSIS — R928 Other abnormal and inconclusive findings on diagnostic imaging of breast: Secondary | ICD-10-CM

## 2021-03-23 ENCOUNTER — Encounter: Payer: Self-pay | Admitting: Internal Medicine

## 2021-03-23 MED ORDER — TELMISARTAN 40 MG PO TABS: 40.0000 mg | ORAL_TABLET | Freq: Every day | ORAL | 1 refills | Status: DC

## 2021-03-23 MED ORDER — HYDROCHLOROTHIAZIDE 12.5 MG PO CAPS: 12.5000 mg | ORAL_CAPSULE | Freq: Every day | ORAL | 1 refills | Status: DC

## 2021-04-07 ENCOUNTER — Ambulatory Visit
Admission: RE | Admit: 2021-04-07 | Discharge: 2021-04-07 | Disposition: A | Discharge status: Home / Self Care | Payer: Medicare HMO | Source: Ambulatory Visit | Attending: Internal Medicine | Admitting: Internal Medicine

## 2021-04-07 ENCOUNTER — Other Ambulatory Visit: Payer: Self-pay

## 2021-04-07 DIAGNOSIS — N6489 Other specified disorders of breast: Secondary | ICD-10-CM | POA: Diagnosis present

## 2021-04-07 DIAGNOSIS — R928 Other abnormal and inconclusive findings on diagnostic imaging of breast: Secondary | ICD-10-CM

## 2021-04-26 ENCOUNTER — Other Ambulatory Visit: Payer: Self-pay

## 2021-04-26 ENCOUNTER — Ambulatory Visit: Payer: Medicare HMO | Admitting: Dermatology

## 2021-04-26 DIAGNOSIS — L853 Xerosis cutis: Secondary | ICD-10-CM

## 2021-04-26 DIAGNOSIS — L820 Inflamed seborrheic keratosis: Secondary | ICD-10-CM

## 2021-04-26 DIAGNOSIS — L578 Other skin changes due to chronic exposure to nonionizing radiation: Secondary | ICD-10-CM

## 2021-04-26 DIAGNOSIS — L858 Other specified epidermal thickening: Secondary | ICD-10-CM

## 2021-04-26 DIAGNOSIS — Z85828 Personal history of other malignant neoplasm of skin: Secondary | ICD-10-CM | POA: Diagnosis not present

## 2021-04-26 DIAGNOSIS — L821 Other seborrheic keratosis: Secondary | ICD-10-CM

## 2021-04-26 DIAGNOSIS — B353 Tinea pedis: Secondary | ICD-10-CM

## 2021-04-26 NOTE — Progress Notes (Signed)
? ?  Follow-Up Visit ?  ?Subjective  ?Terri Melendez is a 71 y.o. female who presents for the following: Follow-up (Acne papule vs other of right back above braline - recheck today./Tinea pedis follow up - ketoconazole 2% cream ). ?The patient has spots, moles and lesions to be evaluated, some may be new or changing and the patient has concerns that these could be cancer. ? ?The following portions of the chart were reviewed this encounter and updated as appropriate:  ? Tobacco  Allergies  Meds  Problems  Med Hx  Surg Hx  Fam Hx   ?  ?Review of Systems:  No other skin or systemic complaints except as noted in HPI or Assessment and Plan. ? ?Objective  ?Well appearing patient in no apparent distress; mood and affect are within normal limits. ? ?A focused examination was performed including back, feet. Relevant physical exam findings are noted in the Assessment and Plan. ? ?Right back (3) ?Erythematous stuck-on, waxy papule or plaque ? ?Right Foot - Posterior ?Peeling and scale ? ? ?Assessment & Plan  ? ?History of Basal Cell Carcinoma of the Skin ?- No evidence of recurrence today ?- Recommend regular full body skin exams ?- Recommend daily broad spectrum sunscreen SPF 30+ to sun-exposed areas, reapply every 2 hours as needed.  ?- Call if any new or changing lesions are noted between office visits ? ?Actinic Damage ?- chronic, secondary to cumulative UV radiation exposure/sun exposure over time ?- diffuse scaly erythematous macules with underlying dyspigmentation ?- Recommend daily broad spectrum sunscreen SPF 30+ to sun-exposed areas, reapply every 2 hours as needed.  ?- Recommend staying in the shade or wearing long sleeves, sun glasses (UVA+UVB protection) and wide brim hats (4-inch brim around the entire circumference of the hat). ?- Call for new or changing lesions. ? ?Seborrheic Keratoses ?- Stuck-on, waxy, tan-brown papules and/or plaques  ?- Benign-appearing ?- Discussed benign etiology and  prognosis. ?- Observe ?- Call for any changes ? ?Inflamed seborrheic keratosis (3) ?Right back ? ?Destruction of lesion - Right back ?Complexity: simple   ?Destruction method: cryotherapy   ?Informed consent: discussed and consent obtained   ?Timeout:  patient name, date of birth, surgical site, and procedure verified ?Lesion destroyed using liquid nitrogen: Yes   ?Region frozen until ice ball extended beyond lesion: Yes   ?Outcome: patient tolerated procedure well with no complications   ?Post-procedure details: wound care instructions given   ? ?Tinea pedis of both feet with element of xerosis and hyperkeratosis ?Right Foot - Posterior ?Chronic and persistent condition with duration or expected duration over one year. Condition is symptomatic/ bothersome to patient. Not currently at goal. ? ?Continue Ketoconazole 2% cream qhs.  ?Discussed Baby Foot. ? ?Related Medications ?ketoconazole (NIZORAL) 2 % cream ?Apply to the feet QHS. ? ?Return for Follow up as scheduled, TBSE. ? ?I, Ashok Cordia, CMA, am acting as scribe for Sarina Ser, MD . ?Documentation: I have reviewed the above documentation for accuracy and completeness, and I agree with the above. ? ?Sarina Ser, MD ? ?

## 2021-04-26 NOTE — Patient Instructions (Addendum)
Dermanail Nail Conditioner can be purchased on the [employer]. ? ?Discussed Baby Foot. This can be purchased in our office or on the [employer]. ? ? ?Cryotherapy Aftercare ? ?Wash gently with soap and water everyday.   ?Apply Vaseline and Band-Aid daily until healed.  ? ? ? ?If You Need Anything After Your Visit ? ?If you have any questions or concerns for your doctor, please call our main line at [phone] and press option 4 to reach your doctor's medical assistant. If no one answers, please leave a voicemail as directed and we will return your call as soon as possible. Messages left after 4 pm will be answered the following business day.  ? ?You may also send [place] a message via MyChart. We typically respond to MyChart messages within 1-2 business days. ? ?For prescription refills, please ask your pharmacy to contact our office. Our fax number is [phone]. ? ?If you have an urgent issue when the clinic is closed that cannot wait until the next business day, you can page your doctor at the number below.   ? ?Please note that while we do our best to be available for urgent issues outside of office hours, we are not available 24/7.  ? ?If you have an urgent issue and are unable to reach [place], you may choose to seek medical care at your doctor's office, retail clinic, urgent care center, or emergency room. ? ?If you have a medical emergency, please immediately call 911 or go to the emergency department. ? ?Pager Numbers ? ?- Dr. Nehemiah Massed: [phone] ? ?- Dr. Laurence Ferrari: [phone] ? ?- Dr. Nicole Kindred: [phone] ? ?In the event of inclement weather, please call our main line at [phone] for an update on the status of any delays or closures. ? ?Dermatology Medication Tips: ?Please keep the boxes that topical medications come in in order to help keep track of the instructions about where and how to use these. Pharmacies typically print the medication instructions only on the boxes and not  directly on the medication tubes.  ? ?If your medication is too expensive, please contact our office at [phone] option 4 or send [place] a message through [place].  ? ?We are unable to tell what your co-pay for medications will be in advance as this is different depending on your insurance coverage. However, we may be able to find a substitute medication at lower cost or fill out paperwork to get insurance to cover a needed medication.  ? ?If a prior authorization is required to get your medication covered by your insurance company, please allow [place] 1-2 business days to complete this process. ? ?Drug prices often vary depending on where the prescription is filled and some pharmacies may offer cheaper prices. ? ?The website www.goodrx.com contains coupons for medications through different pharmacies. The prices here do not account for what the cost may be with help from insurance (it may be cheaper with your insurance), but the website can give you the price if you did not use any insurance.  ?- You can print the associated coupon and take it with your prescription to the pharmacy.  ?- You may also stop by our office during regular business hours and pick up a GoodRx coupon card.  ?- If you need your prescription sent electronically to a different pharmacy, notify our office through [facility] or by phone at [phone] option 4. ? ? ? ? ?Si Usted Necesita Algo Despu?s de Su Visita ? ?Tambi?n puede  enviarnos un mensaje a trav?s de MyChart. Por lo general respondemos a los mensajes de MyChart en el transcurso de 1 a 2 d?as h?biles. ? ?Para renovar recetas, por favor pida a su farmacia que se ponga en contacto con nuestra oficina. Nuestro n?mero de fax es el [phone]. ? ?Si tiene un asunto urgente cuando la cl?nica est? cerrada y que no puede esperar hasta el siguiente d?a h?bil, puede llamar/localizar a su doctor(a) al n?mero que aparece a continuaci?n.  ? ?Por favor, tenga en cuenta que aunque hacemos  todo lo posible para estar disponibles para asuntos urgentes fuera del horario de oficina, no estamos disponibles las 24 horas del d?a, los 7 d?as de la semana.  ? ?Si tiene un problema urgente y no puede comunicarse con nosotros, puede optar por buscar atenci?n m?dica  en el consultorio de su doctor(a), en una cl?nica privada, en un centro de atenci?n urgente o en una sala de emergencias. ? ?Si tiene Engineer, maintenance (IT) m?dica, por favor llame inmediatamente al 911 o vaya a la sala de emergencias. ? ?N?meros de b?per ? ?- Dr. Nehemiah Massed: [phone] ? ?- Dra. Moye: [phone] ? ?- Dra. Nicole Kindred: [phone] ? ?En caso de inclemencias del tiempo, por favor llame a nuestra l?nea principal al [phone] para una actualizaci?n sobre el estado de cualquier retraso o cierre. ? ?Consejos para la medicaci?n en dermatolog?a: ?Por favor, guarde las cajas en las que vienen los medicamentos de uso t?pico para ayudarle a seguir las instrucciones sobre d?nde y c?mo usarlos. Las farmacias generalmente imprimen las instrucciones del medicamento s?lo en las cajas y no directamente en los tubos del [place].  ? ?Si su medicamento es muy caro, por favor, p?ngase en contacto con Zigmund Daniel llamando al [phone] y presione la opci?n 4 o env?enos un mensaje a trav?s de MyChart.  ? ?No podemos decirle cu?l ser? su copago por los medicamentos por adelantado ya que esto es diferente dependiendo de la cobertura de su seguro. Sin embargo, es posible que podamos encontrar un medicamento sustituto a Electrical engineer un formulario para que el seguro cubra el medicamento que se considera necesario.  ? ?Si se requiere Ardelia Mems autorizaci?n previa para que su compa??a de seguros [place] su medicamento, por favor perm?tanos de 1 a 2 d?as h?biles para completar este proceso. ? ?Los precios de los medicamentos var?an con frecuencia dependiendo del Environmental consultant de d?nde se surte la receta y alguna farmacias pueden ofrecer precios m?s baratos. ? ?El  sitio web www.goodrx.com tiene cupones para medicamentos de Airline pilot. Los precios aqu? no tienen en cuenta lo que podr?a costar con la ayuda del seguro (puede ser m?s barato con su seguro), pero el sitio web puede darle el precio si no utiliz? ning?n seguro.  ?- Puede imprimir el cup?n correspondiente y llevarlo con su receta a la farmacia.  ?- Tambi?n puede pasar por nuestra oficina durante el horario de atenci?n regular y recoger una tarjeta de cupones de GoodRx.  ?- Si necesita que su receta se env?e electr?nicamente a Chiropodist, informe a nuestra oficina a trav?s de MyChart de [place] o por tel?fono llamando al [phone] y presione la opci?n 4.  ?

## 2021-04-28 ENCOUNTER — Encounter: Payer: Self-pay | Admitting: Dermatology

## 2021-06-16 ENCOUNTER — Encounter: Payer: Self-pay | Admitting: Internal Medicine

## 2021-06-16 ENCOUNTER — Ambulatory Visit (INDEPENDENT_AMBULATORY_CARE_PROVIDER_SITE_OTHER): Payer: Medicare HMO | Admitting: Internal Medicine

## 2021-06-16 VITALS — BP 130/62 | HR 78 | Temp 98.2°F | Ht 64.0 in | Wt 140.0 lb

## 2021-06-16 DIAGNOSIS — R519 Headache, unspecified: Secondary | ICD-10-CM

## 2021-06-16 DIAGNOSIS — Z9989 Dependence on other enabling machines and devices: Secondary | ICD-10-CM

## 2021-06-16 DIAGNOSIS — J301 Allergic rhinitis due to pollen: Secondary | ICD-10-CM | POA: Diagnosis not present

## 2021-06-16 DIAGNOSIS — I10 Essential (primary) hypertension: Secondary | ICD-10-CM

## 2021-06-16 DIAGNOSIS — E7800 Pure hypercholesterolemia, unspecified: Secondary | ICD-10-CM

## 2021-06-16 DIAGNOSIS — I700 Atherosclerosis of aorta: Secondary | ICD-10-CM

## 2021-06-16 DIAGNOSIS — G4733 Obstructive sleep apnea (adult) (pediatric): Secondary | ICD-10-CM | POA: Diagnosis not present

## 2021-06-16 LAB — COMPREHENSIVE METABOLIC PANEL
ALT: 14 U/L (ref 0–35)
AST: 17 U/L (ref 0–37)
Albumin: 4.1 g/dL (ref 3.5–5.2)
Alkaline Phosphatase: 88 U/L (ref 39–117)
BUN: 19 mg/dL (ref 6–23)
CO2: 28 mEq/L (ref 19–32)
Calcium: 9.2 mg/dL (ref 8.4–10.5)
Chloride: 105 mEq/L (ref 96–112)
Creatinine, Ser: 0.82 mg/dL (ref 0.40–1.20)
GFR: 73.33 mL/min (ref >60.00)
Glucose, Bld: 72 mg/dL (ref 70–99)
Potassium: 3.8 mEq/L (ref 3.5–5.1)
Sodium: 141 mEq/L (ref 135–145)
Total Bilirubin: 0.5 mg/dL (ref 0.2–1.2)
Total Protein: 6.2 g/dL (ref 6.0–8.3)

## 2021-06-16 LAB — LIPID PANEL
Cholesterol: 154 mg/dL (ref 0–200)
HDL: 54 mg/dL (ref >39.00)
LDL Cholesterol: 81 mg/dL (ref 0–99)
NonHDL: 100.31
Total CHOL/HDL Ratio: 3
Triglycerides: 98 mg/dL (ref 0.0–149.0)
VLDL: 19.6 mg/dL (ref 0.0–40.0)

## 2021-06-16 NOTE — Progress Notes (Signed)
? ?Subjective:  ?Patient ID: Terri Melendez, female    DOB: [birth year]  Age: 70 y.o. MRN: [id number] ? ?CC: The primary encounter diagnosis was Essential hypertension. Diagnoses of Hypercholesteremia, Seasonal allergic rhinitis due to pollen, OSA on CPAP, Thoracic aortic atherosclerosis ([place]), and Recurrent occipital headache were also pertinent to this visit. ? ? ?This visit occurred during the SARS-CoV-2 public health emergency.  Safety protocols were in place, including screening questions prior to the visit, additional usage of staff PPE, and extensive cleaning of exam room while observing appropriate contact time as indicated for disinfecting solutions.   ? ?HPI ?Terri Melendez presents for follow up on hypertension and hyperlipidemia  ?Chief Complaint  ?Patient presents with  ? Follow-up  ?  6 month follow up   ? ? ?1) Hypertension: patient has not been checking  blood pressure lately.   Readings have been for the most part < 140/80 at rest . Patient is following a reduce salt diet most days and is taking medications as prescribed  ? ?2) headaches  in the occipital area,  occurring randomly,  several per week,  not pounding,  some resolve spontaneously, no nausea or light sensitivity .  Mild.  ? ?3) Hyperlipidemia:  tolerating atorvastatin  ? ?Outpatient Medications Prior to Visit  ?Medication Sig Dispense Refill  ? atorvastatin (LIPITOR) 20 MG tablet Take 1 tablet (20 mg total) by mouth daily. 90 tablet 3  ? hydrochlorothiazide (MICROZIDE) 12.5 MG capsule Take 1 capsule (12.5 mg total) by mouth daily. 90 capsule 1  ? ketoconazole (NIZORAL) 2 % cream Apply to the feet QHS. 60 g 1  ? levocetirizine (XYZAL) 5 MG tablet Take 5 mg by mouth every evening.    ? mometasone (ELOCON) 0.1 % lotion Apply topically.    ? mometasone (NASONEX) 50 MCG/ACT nasal spray Place 2 sprays into the nose daily.    ? omeprazole (PRILOSEC) 40 MG capsule Take 40 mg by mouth daily.    ? telmisartan (MICARDIS) 40 MG tablet Take  1 tablet (40 mg total) by mouth daily. 90 tablet 1  ? ipratropium (ATROVENT) 0.06 % nasal spray Place 2 sprays into both nostrils 4 (four) times daily. 15 mL 12  ? predniSONE (STERAPRED UNI-PAK 21 TAB) 10 MG (21) TBPK tablet Take by mouth daily. As directed (Patient not taking: Reported on 12/16/2020) 21 tablet 0  ? ?No facility-administered medications prior to visit.  ? ? ?Review of Systems; ? ?Patient denies headache, fevers, malaise, unintentional weight loss, skin rash, eye pain, sinus congestion and sinus pain, sore throat, dysphagia,  hemoptysis , cough, dyspnea, wheezing, chest pain, palpitations, orthopnea, edema, abdominal pain, nausea, melena, diarrhea, constipation, flank pain, dysuria, hematuria, urinary  Frequency, nocturia, numbness, tingling, seizures,  Focal weakness, Loss of consciousness,  Tremor, insomnia, depression, anxiety, and suicidal ideation.   ? ? ? ?Objective:  ?BP 130/62 (BP Location: Left Arm, Patient Position: Sitting, Cuff Size: Normal)   Pulse 78   Temp 98.2 ?F (36.8 ?C) (Oral)   Ht '5\' 4"'$  (1.626 m)   Wt 140 lb (63.5 kg)   SpO2 99%   BMI 24.03 kg/m?  ? ?BP Readings from Last 3 Encounters:  ?06/16/21 130/62  ?12/16/20 128/60  ?11/15/20 126/64  ? ? ?Wt Readings from Last 3 Encounters:  ?06/16/21 140 lb (63.5 kg)  ?12/16/20 140 lb (63.5 kg)  ?11/15/20 134 lb 12.8 oz (61.1 kg)  ? ? ?General appearance: alert, cooperative and appears stated age ?Ears: normal TM's and external ear  canals both ears ?Throat: lips, mucosa, and tongue normal; teeth and gums normal ?Neck: no adenopathy, no carotid bruit, supple, symmetrical, trachea midline and thyroid not enlarged, symmetric, no tenderness/mass/nodules ?Back: symmetric, no curvature. ROM normal. No CVA tenderness. ?Lungs: clear to auscultation bilaterally ?Heart: regular rate and rhythm, S1, S2 normal, no murmur, click, rub or gallop ?Abdomen: soft, non-tender; bowel sounds normal; no masses,  no organomegaly ?Pulses: 2+ and  symmetric ?Skin: Skin color, texture, turgor normal. No rashes or lesions ?Lymph nodes: Cervical, supraclavicular, and axillary nodes normal. ?Neuro:  awake and interactive with normal mood and affect. Higher cortical functions are normal. Speech is clear without word-finding difficulty or dysarthria. Extraocular movements are intact. Visual fields of both eyes are grossly intact. Sensation to light touch is grossly intact bilaterally of upper and lower extremities. Motor examination shows 4+/5 symmetric hand grip and upper extremity and 5/5 lower extremity strength. There is no pronation or drift. Gait is non-ataxic  ? ?No results found for: HGBA1C ? ?Lab Results  ?Component Value Date  ? CREATININE 0.82 06/16/2021  ? CREATININE 0.72 12/16/2020  ? CREATININE 0.79 11/15/2020  ? ? ?Lab Results  ?Component Value Date  ? WBC 5.8 11/15/2020  ? HGB 11.6 (L) 11/15/2020  ? HCT 34.5 (L) 11/15/2020  ? PLT 287.0 11/15/2020  ? GLUCOSE 72 06/16/2021  ? CHOL 154 06/16/2021  ? TRIG 98.0 06/16/2021  ? HDL 54.00 06/16/2021  ? [place] 81 06/16/2021  ? ALT 14 06/16/2021  ? AST 17 06/16/2021  ? NA 141 06/16/2021  ? K 3.8 06/16/2021  ? CL 105 06/16/2021  ? CREATININE 0.82 06/16/2021  ? BUN 19 06/16/2021  ? CO2 28 06/16/2021  ? TSH 0.99 12/14/2019  ? MICROALBUR <0.7 12/14/2019  ? ? ?MM DIAG BREAST TOMO UNI LEFT ? ?Result Date: 04/07/2021 ?CLINICAL DATA:  Recall from screening mammography, possible asymmetry in the central LEFT breast visible only on the MLO view. EXAM: DIGITAL DIAGNOSTIC UNILATERAL LEFT MAMMOGRAM WITH TOMOSYNTHESIS AND CAD TECHNIQUE: Left digital diagnostic mammography and breast tomosynthesis was performed. The images were evaluated with computer-aided detection. COMPARISON:  Previous exam(s). ACR Breast Density Category c: The breast tissue is heterogeneously dense, which may obscure small masses. FINDINGS: Spot-compression MLO view of the area of concern and a full field mediolateral view were obtained. The asymmetry  questioned on screening mammography disperses with compression, indicating overlapping fibroglandular tissue. There is no underlying mass or architectural distortion. No findings suspicious for malignancy. IMPRESSION: No mammographic evidence of malignancy involving the LEFT breast. RECOMMENDATION: Screening mammogram in one year.(Code:SM-B-01Y) I have discussed the findings and recommendations with the patient. If applicable, a reminder letter will be sent to the patient regarding the next appointment. BI-RADS CATEGORY  1: Negative. Electronically Signed   By: Evangeline Dakin M.D.   On: 04/07/2021 10:53 ? ? ?Assessment & Plan:  ? ?Problem List Items Addressed This Visit   ? ? Hypercholesteremia  ?  Managed with atorvastatin 20 mg daily without side effects.  ? ?  ?  ? Relevant Orders  ? Lipid panel (Completed)  ? OSA on CPAP  ?  Diagnosed by sleep study and managed by Dr Tami Ribas. . She is wearing her CPAP every night a minimum of 6 hours per night and notes improved daytime wakefulness and decreased fatigue  ? ?  ?  ? Essential hypertension - Primary  ?  Well controlled on current regimen of telmisartan and hctz. . encouarged to check BP at home , no changes  today. ? ?  ?  ? Relevant Orders  ? Comprehensive metabolic panel (Completed)  ? Thoracic aortic atherosclerosis ([place])  ?  Noted on CT in 2022.  She is tolerating high potency Statin therapy i ? ?  ?  ? Allergic rhinitis  ?  Managed with mometasone and xyzal .  Reviewed symptoms management  ? ?  ?  ? Recurrent occipital headache  ?  Mild,  Random.  Neuro exam is normal.  ? ?  ?  ? ? ? ?Follow-up: Return in about 6 months (around 12/16/2021). ? ? ?Crecencio Mc, MD ?

## 2021-06-16 NOTE — Assessment & Plan Note (Signed)
Diagnosed by sleep study and managed by Dr Tami Ribas. . She is wearing her CPAP every night a minimum of 6 hours per night and notes improved daytime wakefulness and decreased fatigue  ?

## 2021-06-16 NOTE — Assessment & Plan Note (Signed)
Noted on CT in 2022.  She is tolerating high potency Statin therapy i ?

## 2021-06-16 NOTE — Assessment & Plan Note (Signed)
Managed with mometasone and xyzal .  Reviewed symptoms management  ?

## 2021-06-16 NOTE — Assessment & Plan Note (Signed)
Managed with atorvastatin 20 mg daily without side effects.  ?

## 2021-06-16 NOTE — Patient Instructions (Addendum)
?  The goals for optimal blood pressure management are 120/70 to 130/80.  Please check your blood pressure a few times at home and send me the readings so I can determine if you need a change in medication   ? ?You can increase your Xyzal to twice daily if needed to manage allergies.  It is perfectly safe ? ? ? ?

## 2021-06-16 NOTE — Assessment & Plan Note (Signed)
Well controlled on current regimen of telmisartan and hctz. . encouarged to check BP at home , no changes today. ?

## 2021-06-18 DIAGNOSIS — R519 Headache, unspecified: Secondary | ICD-10-CM | POA: Insufficient documentation

## 2021-06-18 NOTE — Assessment & Plan Note (Signed)
Mild,  Random.  Neuro exam is normal.  ?

## 2021-08-23 ENCOUNTER — Encounter: Payer: Self-pay | Admitting: Internal Medicine

## 2021-08-24 ENCOUNTER — Other Ambulatory Visit: Payer: Self-pay

## 2021-08-24 MED ORDER — LEVOCETIRIZINE DIHYDROCHLORIDE 5 MG PO TABS: 5.0000 mg | ORAL_TABLET | Freq: Every evening | ORAL | 1 refills | Status: DC

## 2021-09-15 ENCOUNTER — Other Ambulatory Visit: Payer: Self-pay | Admitting: Internal Medicine

## 2021-10-06 ENCOUNTER — Ambulatory Visit (INDEPENDENT_AMBULATORY_CARE_PROVIDER_SITE_OTHER): Payer: Medicare HMO

## 2021-10-06 VITALS — Ht 64.0 in | Wt 140.0 lb

## 2021-10-06 DIAGNOSIS — Z0000 Encounter for general adult medical examination without abnormal findings: Secondary | ICD-10-CM

## 2021-10-06 NOTE — Patient Instructions (Addendum)
  Terri Melendez , Thank you for taking time to come for your Medicare Wellness Visit. I appreciate your ongoing commitment to your health goals. Please review the following plan we discussed and let me know if I can assist you in the future.   These are the goals we discussed:  Goals      Follow up with Primary Care Provider     As needed.        This is a list of the screening recommended for you and due dates:  Health Maintenance  Topic Date Due   COVID-19 Vaccine (5 - Pfizer risk series) 01/16/2021   Flu Shot  09/26/2021   Mammogram  03/21/2023   Tetanus Vaccine  10/16/2025   Colon Cancer Screening  10/08/2028   Pneumonia Vaccine  Completed   DEXA scan (bone density measurement)  Completed   Hepatitis C Screening: USPSTF Recommendation to screen - Ages 33-79 yo.  Completed   Zoster (Shingles) Vaccine  Completed   HPV Vaccine  Aged Out

## 2021-10-06 NOTE — Progress Notes (Signed)
Subjective:   Terri Melendez is a 71 y.o. female who presents for Medicare Annual (Subsequent) preventive examination.  Review of Systems    No ROS.  Medicare Wellness Virtual Visit.  Visual/audio telehealth visit, UTA vital signs.   See social history for additional risk factors.   Cardiac Risk Factors include: advanced age (>62mn, >>90women)     Objective:    Today's Vitals   10/06/21 0837  Weight: 140 lb (63.5 kg)  Height: '5\' 4"'$  (1.626 m)   Body mass index is 24.03 kg/m.     10/06/2021    8:49 AM 10/03/2020   10:37 AM 10/01/2019   10:48 AM 09/20/2019   11:13 PM 09/30/2018   10:43 AM  Advanced Directives  Does Patient Have a Medical Advance Directive? Yes Yes Yes Yes Yes  Type of AParamed[place]of [place] will [place] will [place] will  [place] will  Does patient want to make changes to medical advance directive? No - Patient declined No - Patient declined No - Patient declined  No - Patient declined  Copy of HVentressin Chart? Yes - validated most recent copy scanned in chart (See row information) Yes - validated most recent copy scanned in chart (See row information) Yes - validated most recent copy scanned in chart (See row information)  No - copy requested    Current Medications (verified) Outpatient Encounter Medications as of 10/06/2021  Medication Sig   atorvastatin (LIPITOR) 20 MG tablet Take 1 tablet (20 mg total) by mouth daily.   hydrochlorothiazide (MICROZIDE) 12.5 MG capsule TAKE ONE CAPSULE BY MOUTH DAILY   ketoconazole (NIZORAL) 2 % cream Apply to the feet QHS.   levocetirizine (XYZAL) 5 MG tablet Take 1 tablet (5 mg total) by mouth every evening.   mometasone (ELOCON) 0.1 % lotion Apply topically.   mometasone (NASONEX) 50 MCG/ACT nasal spray Place 2 sprays into the nose daily.   omeprazole (PRILOSEC) 40 MG capsule Take 40  mg by mouth daily.   telmisartan (MICARDIS) 40 MG tablet TAKE ONE TABLET BY MOUTH DAILY   No facility-administered encounter medications on file as of 10/06/2021.    Allergies (verified) Dust mite extract   History: Past Medical History:  Diagnosis Date   Cancer ([place]    basal cell on face in 2000 or 2001   Hx of basal cell carcinoma 2002   right cheek    Hypertension    Osteopenia    Sinus congestion    Past Surgical History:  Procedure Laterality Date   BASAL CELL CARCINOMA EXCISION     TONSILLECTOMY AND ADENOIDECTOMY     Family History  Problem Relation Age of Onset   Cancer Mother        lung   Breast cancer Maternal Aunt    Social History   Socioeconomic History   Marital status: Married    Spouse name: Not on file   Number of children: Not on file   Years of education: Not on file   Highest education level: Not on file  Occupational History   Not on file  Tobacco Use   Smoking status: Never   Smokeless tobacco: Never  Vaping Use   Vaping Use: Never used  Substance and Sexual Activity   Alcohol use: Yes    Comment: on occasion   Drug use: No   Sexual activity: Not on file  Other Topics Concern  Not on file  Social History Narrative   Not on file   Social Determinants of Health   Financial Resource Strain: Low Risk  (10/06/2021)   Overall Financial Resource Strain (CARDIA)    Difficulty of Paying Living Expenses: Not hard at all  Food Insecurity: No Food Insecurity (10/06/2021)   Hunger Vital Sign    Worried About Running Out of Food in the Last Year: Never true    Ran Out of Food in the Last Year: Never true  Transportation Needs: No Transportation Needs (10/06/2021)   PRAPARE - Hydrologist (Medical): No    Lack of Transportation (Non-Medical): No  Physical Activity: Sufficiently Active (10/06/2021)   Exercise Vital Sign    Days of Exercise per Week: 5 days    Minutes of Exercise per Session: 30 min  Stress: No  Stress Concern Present (10/06/2021)   [place]    Feeling of Stress : Not at all  Social Connections: Unknown (10/06/2021)   Social Connection and Isolation Panel [NHANES]    Frequency of Communication with Friends and Family: More than three times a week    Frequency of Social Gatherings with Friends and Family: More than three times a week    Attends Religious Services: Not on Advertising copywriter or Organizations: Not on file    Attends Archivist Meetings: Not on file    Marital Status: Not on file    Tobacco Counseling Counseling given: Not Answered   Clinical Intake:  Pre-visit preparation completed: Yes        Diabetes: No  How often do you need to have someone help you when you read instructions, pamphlets, or other written materials from your doctor or pharmacy?: 1 - Never   Interpreter Needed?: No    Activities of Daily Living    10/06/2021    8:39 AM  In your present state of health, do you have any difficulty performing the following activities:  Hearing? 0  Vision? 0  Difficulty concentrating or making decisions? 0  Walking or climbing stairs? 0  Dressing or bathing? 0  Doing errands, shopping? 0  Preparing Food and eating ? N  Using the Toilet? N  Managing your Medications? N  Managing your Finances? N  Housekeeping or managing your Housekeeping? N    Patient Care Team: Crecencio Mc, MD as PCP - General (Internal Medicine)  Indicate any recent Medical Services you may have received from other than [facility] providers in the past year (date may be approximate).     Assessment:   This is a routine wellness examination for Terri Melendez.  Virtual Visit via Telephone Note  I connected with  KAEDYN BELARDO on 10/06/21 at  8:30 AM EDT by telephone and verified that I am speaking with the correct person using two identifiers.  Location: Patient: home Provider:  office Persons participating in the virtual visit: patient/Nurse Health Advisor   I discussed the limitations of performing an evaluation and management service by telehealth. We continued and completed visit with audio only. Some vital signs may be absent or patient reported.   Hearing/Vision screen Hearing Screening - Comments:: Patient is able to hear conversational tones without difficulty. No issues reported.  Vision Screening - Comments:: Followed by Dr. Matilde Sprang Wears corrective lenses  They have seen their ophthalmologist in the last 12 months.   Dietary issues and exercise activities discussed: Current  Exercise Habits: Home exercise routine, Type of exercise: walking, Intensity: Mild   Goals Addressed             This Visit's Progress    Follow up with Primary Care Provider       As needed.       Depression Screen    10/06/2021    8:38 AM 06/16/2021    9:11 AM 12/16/2020    9:59 AM 10/03/2020   10:35 AM 06/15/2020   11:08 AM 10/01/2019   10:42 AM 09/30/2018   10:44 AM  PHQ 2/9 Scores  PHQ - 2 Score 0 0 0 0 0 0 0  PHQ- 9 Score     0      Fall Risk    10/06/2021    8:38 AM 06/16/2021    9:11 AM 01/17/2021   10:44 AM 12/16/2020    9:59 AM 11/15/2020   11:32 AM  Fall Risk   Falls in the past year? 0 0 0 0 0  Number falls in past yr:   0 0 0  Injury with Fall?   0 0 0  Risk for fall due to :  No Fall Risks No Fall Risks  No Fall Risks  Follow up Falls evaluation completed Falls evaluation completed Falls evaluation completed Falls evaluation completed Falls evaluation completed    [place]: Home free of loose throw rugs in walkways, pet beds, electrical cords, etc? Yes  Adequate lighting in your home to reduce risk of falls? Yes   ASSISTIVE DEVICES UTILIZED TO PREVENT FALLS: Life alert? No  Use of a cane, walker or w/c? No   TIMED UP AND GO: Was the test performed? No .   Cognitive Function: Patient is alert and oriented x3.   Denies difficulty focusing, making decisions, memory loss.      10/01/2019   10:55 AM  MMSE - Mini Mental State Exam  Not completed: Unable to complete        09/30/2018   10:56 AM  6CIT Screen  What Year? 0 points  What month? 0 points  What time? 0 points  Count back from 20 0 points  Months in reverse 0 points  Repeat phrase 0 points  Total Score 0 points    Immunizations Immunization History  Administered Date(s) Administered   Fluad Quad(high Dose 65+) 11/22/2018, 11/15/2020   Influenza Split 01/13/2016   Influenza-Unspecified 12/13/2016, 12/31/2017   PFIZER(Purple Top)SARS-COV-2 Vaccination 04/10/2019, 05/05/2019, 11/27/2019   PNEUMOCOCCAL CONJUGATE-20 06/15/2020   Pfizer Covid-19 Vaccine Bivalent Booster 53yr & up 11/21/2020   Pneumococcal Conjugate-13 12/11/2018   Td 06/25/2005   Tdap 10/17/2015   Zoster Recombinat (Shingrix) 12/22/2018, 03/09/2019   Zoster, Live 09/06/2010   Screening Tests Health Maintenance  Topic Date Due   COVID-19 Vaccine (5 - Pfizer risk series) 01/16/2021   INFLUENZA VACCINE  09/26/2021   MAMMOGRAM  03/21/2023   TETANUS/TDAP  10/16/2025   COLONOSCOPY (Pts 45-422yrInsurance coverage will need to be confirmed)  10/08/2028   Pneumonia Vaccine 6529Years old  Completed   DEXA SCAN  Completed   Hepatitis C Screening  Completed   Zoster Vaccines- Shingrix  Completed   HPV VACCINES  Aged Out   Health Maintenance Health Maintenance Due  Topic Date Due   COVID-19 Vaccine (5 - Pfizer risk series) 01/16/2021   INFLUENZA VACCINE  09/26/2021   Lung Cancer Screening: (Low Dose CT Chest recommended if Age 69-80ears, 30 pack-year currently  smoking OR have quit w/in 15years.) does not qualify.   Vision Screening: Recommended annual ophthalmology exams for early detection of glaucoma and other disorders of the eye.  Dental Screening: Recommended annual dental exams for proper oral hygiene  Community Resource Referral / Chronic Care  Management: CRR required this visit?  No   CCM required this visit?  No      Plan:   Keep all routine maintenance appointments.   I have personally reviewed and noted the following in the patient's chart:   Medical and social history Use of alcohol, tobacco or illicit drugs  Current medications and supplements including opioid prescriptions.  Functional ability and status Nutritional status Physical activity Advanced directives List of other physicians Hospitalizations, surgeries, and ER visits in previous 12 months Vitals Screenings to include cognitive, depression, and falls Referrals and appointments  In addition, I have reviewed and discussed with patient certain preventive protocols, quality metrics, and best practice recommendations. A written personalized care plan for preventive services as well as general preventive health recommendations were provided to patient.     Varney Biles, LPN   0/24/0973

## 2021-11-18 ENCOUNTER — Ambulatory Visit
Admission: RE | Admit: 2021-11-18 | Discharge: 2021-11-18 | Disposition: A | Discharge status: Home / Self Care | Payer: Medicare HMO | Source: Ambulatory Visit | Attending: Emergency Medicine | Admitting: Emergency Medicine

## 2021-11-18 VITALS — BP 145/75 | HR 106 | Temp 98.4°F | Resp 18 | Ht 64.0 in | Wt 142.0 lb

## 2021-11-18 DIAGNOSIS — J069 Acute upper respiratory infection, unspecified: Secondary | ICD-10-CM

## 2021-11-18 DIAGNOSIS — J302 Other seasonal allergic rhinitis: Secondary | ICD-10-CM

## 2021-11-18 MED ORDER — GUAIFENESIN ER 600 MG PO TB12: 1200.0000 mg | ORAL_TABLET | Freq: Two times a day (BID) | ORAL | 0 refills | Status: DC | PRN

## 2021-11-18 NOTE — Discharge Instructions (Addendum)
Take the guaifenesin as directed.  Use Flonase nasal spray as directed.  Follow up with your primary care provider if your symptoms are not improving.

## 2021-11-18 NOTE — ED Provider Notes (Signed)
[facility]    CSN: [id number] Arrival date & time: 11/18/21  0900      History   Chief Complaint Chief Complaint  Patient presents with   Nasal Congestion    Possible sinus infection.  Tested negative for covid.   Started with sore throat on Tues.  Throat is better but am very congested. - Entered by patient    HPI Terri Melendez is a 71 y.o. female.  Patient presents with 4-day history of nasal congestion, postnasal drip, sinus pressure, mild nonproductive cough.  At the onset of her symptoms she also had a sore throat and headache but these have resolved.  No fever, chills, rash, chest pain, shortness of breath, vomiting, diarrhea, or other symptoms.  Treatment home with OTC allergy medication.  Her medical history includes hypertension, OSA, seasonal allergies, hypercholesterolemia, thoracic aortic atherosclerosis, thyroid nodule, GERD.  The history is provided by the patient and medical records.    Past Medical History:  Diagnosis Date   Cancer ([place])    basal cell on face in 2000 or 2001   Hx of basal cell carcinoma 2002   right cheek    Hypertension    Osteopenia    Sinus congestion     Patient Active Problem List   Diagnosis Date Noted   Recurrent occipital headache 06/18/2021   Change in bowel habits 11/15/2020   Allergic rhinitis 06/17/2020   GERD with esophagitis 06/17/2020   Polyarthralgia 12/15/2019   Thoracic aortic atherosclerosis ([place]) 12/15/2019   Coronary atherosclerosis due to calcified coronary lesion (CODE) 12/15/2019   Encounter for preventive health examination 12/13/2018   History of shingles 05/04/2018   Non-functional thyroid nodule 05/04/2018   Essential hypertension 10/22/2017   Advanced care planning/counseling discussion 10/22/2017   Hypercholesteremia 05/14/2016   OSA on CPAP 10/13/2014    Past Surgical History:  Procedure Laterality Date   BASAL CELL CARCINOMA EXCISION     TONSILLECTOMY AND ADENOIDECTOMY      OB  History   No obstetric history on file.      Home Medications    Prior to Admission medications   Medication Sig Start Date End Date Taking? Authorizing Provider  guaiFENesin (MUCINEX) 600 MG 12 hr tablet Take 2 tablets (1,200 mg total) by mouth 2 (two) times daily as needed. 11/18/21  Yes Sharion Balloon, NP  atorvastatin (LIPITOR) 20 MG tablet Take 1 tablet (20 mg total) by mouth daily. 11/22/20   Crecencio Mc, MD  hydrochlorothiazide (MICROZIDE) 12.5 MG capsule TAKE ONE CAPSULE BY MOUTH DAILY 09/15/21   Crecencio Mc, MD  ketoconazole (NIZORAL) 2 % cream Apply to the feet QHS. 02/13/21   Ralene Bathe, MD  levocetirizine (XYZAL) 5 MG tablet Take 1 tablet (5 mg total) by mouth every evening. 08/24/21   Crecencio Mc, MD  mometasone (ELOCON) 0.1 % lotion Apply topically. 12/11/19   [provider]  mometasone (NASONEX) 50 MCG/ACT nasal spray Place 2 sprays into the nose daily.    [provider]  omeprazole (PRILOSEC) 40 MG capsule Take 40 mg by mouth daily. 08/10/19   [provider]  telmisartan (MICARDIS) 40 MG tablet TAKE ONE TABLET BY MOUTH DAILY 09/15/21   Crecencio Mc, MD    Family History Family History  Problem Relation Age of Onset   Cancer Mother        lung   Breast cancer Maternal Aunt     Social History Social History   Tobacco Use  Smoking status: Never   Smokeless tobacco: Never  Vaping Use   Vaping Use: Never used  Substance Use Topics   Alcohol use: Yes    Comment: on occasion   Drug use: No     Allergies   Dust mite extract   Review of Systems Review of Systems  Constitutional:  Negative for chills and fever.  HENT:  Positive for congestion, postnasal drip, sinus pressure and sore throat. Negative for ear pain.   Respiratory:  Positive for cough. Negative for shortness of breath.   Cardiovascular:  Negative for chest pain and palpitations.  Gastrointestinal:  Negative for diarrhea and vomiting.  Skin:   Negative for color change and rash.  Neurological:  Positive for headaches.  All other systems reviewed and are negative.    Physical Exam Triage Vital Signs ED Triage Vitals  Enc Vitals Group     BP 11/18/21 0911 (!) 145/75     Pulse Rate 11/18/21 0911 (!) 106     Resp 11/18/21 0911 18     Temp 11/18/21 0911 98.4 F (36.9 C)     Temp src --      SpO2 11/18/21 0911 98 %     Weight 11/18/21 0913 142 lb (64.4 kg)     Height 11/18/21 0913 '5\' 4"'$  (1.626 m)     Head Circumference --      Peak Flow --      Pain Score 11/18/21 0910 2     Pain Loc --      Pain Edu? --      Excl. in [place]? --    No data found.  Updated Vital Signs BP (!) 145/75   Pulse (!) 106   Temp 98.4 F (36.9 C)   Resp 18   Ht '5\' 4"'$  (1.626 m)   Wt 142 lb (64.4 kg)   SpO2 98%   BMI 24.37 kg/m   Visual Acuity Right Eye Distance:   Left Eye Distance:   Bilateral Distance:    Right Eye Near:   Left Eye Near:    Bilateral Near:     Physical Exam Vitals and nursing note reviewed.  Constitutional:      General: She is not in acute distress.    Appearance: Normal appearance. She is well-developed. She is not ill-appearing.  HENT:     Right Ear: Tympanic membrane normal.     Left Ear: Tympanic membrane normal.     Nose: Congestion present.     Mouth/Throat:     Mouth: Mucous membranes are moist.     Pharynx: Oropharynx is clear.  Cardiovascular:     Rate and Rhythm: Normal rate and regular rhythm.     Heart sounds: Normal heart sounds.  Pulmonary:     Effort: Pulmonary effort is normal. No respiratory distress.     Breath sounds: Normal breath sounds.  Musculoskeletal:     Cervical back: Neck supple.  Skin:    General: Skin is warm and dry.  Neurological:     Mental Status: She is alert.  Psychiatric:        Mood and Affect: Mood normal.        Behavior: Behavior normal.      UC Treatments / Results  Labs (all labs ordered are listed, but only abnormal results are displayed) Labs  Reviewed - No data to display  EKG   Radiology No results found.  Procedures Procedures (including critical care time)  Medications Ordered in UC Medications -  No data to display  Initial Impression / Assessment and Plan / UC Course  I have reviewed the triage vital signs and the nursing notes.  Pertinent labs & imaging results that were available during my care of the patient were reviewed by me and considered in my medical decision making (see chart for details).   Viral URI, seasonal allergies.  Patient declines COVID test as she had a negative COVID test at home yesterday.  Discussed symptomatic treatment including guaifenesin, Flonase nasal spray, Tylenol or ibuprofen as needed.  Instructed patient to follow up with her PCP if her symptoms are not improving.  She agrees to plan of care.     Final Clinical Impressions(s) / UC Diagnoses   Final diagnoses:  Viral URI  Seasonal allergies     Discharge Instructions      Take the guaifenesin as directed.  Use Flonase nasal spray as directed.  Follow up with your primary care provider if your symptoms are not improving.        ED Prescriptions     Medication Sig Dispense Auth. Provider   guaiFENesin (MUCINEX) 600 MG 12 hr tablet Take 2 tablets (1,200 mg total) by mouth 2 (two) times daily as needed. 12 tablet Sharion Balloon, NP      PDMP not reviewed this encounter.   Sharion Balloon, NP 11/18/21 [phone]

## 2021-11-18 NOTE — ED Triage Notes (Signed)
Patient to Urgent Care with complaints of nasal congestion, sinus pain and sore throat. Reports symptoms started on Tuesday.   Reports her throat pain has improved but she is still congested and is concerned about a possible sinus infection. Denies cough/ fevers.

## 2021-11-19 ENCOUNTER — Other Ambulatory Visit: Payer: Self-pay | Admitting: Internal Medicine

## 2021-12-13 ENCOUNTER — Ambulatory Visit: Payer: Medicare HMO | Admitting: Surgery

## 2021-12-13 ENCOUNTER — Encounter: Payer: Self-pay | Admitting: Surgery

## 2021-12-13 VITALS — BP 143/71 | HR 91 | Temp 97.9°F | Ht 64.0 in | Wt 141.6 lb

## 2021-12-13 DIAGNOSIS — R222 Localized swelling, mass and lump, trunk: Secondary | ICD-10-CM

## 2021-12-13 NOTE — Patient Instructions (Addendum)
Your CT is scheduled for 01/05/2022 at 10 am (arrive by 9:45 am) @ [facility]. Nothing to eat or drink 4 hours prior.    If you have any concerns or questions, please feel free to call our office. See your follow up appointment below.

## 2021-12-13 NOTE — Progress Notes (Signed)
Patient ID: Terri Melendez, female   DOB: Jul 09, 1950, 71 y.o.   MRN: [id number]  HPI Terri Melendez is a 71 y.o. female seen in consultation at the request of Dr. Tami Ribas for an external prominence.  She reports that over the last several weeks has noticed slight prominence on the sternum.  She denies any pain she denies any drainage no evidence of erythema.  No evidence of weight loss night sweats, fevers or chills.  She is able to perform more than 4 METS of activity without any shortness of breath or chest pain. She does have a history of right at with breast cancer and mother with breast lesions that were benign. She Did have bilateral mammogram that I have personally reviewed and there was initially a left  asymmetry that disappeared after compression of mammogram. History of smoking social drinking. Did have a CT 5 years ago showing no evidence of major chest wall abnormalities. Follows Dr. Tami Ribas for a right thyroid nodule Recent CBC and CMP shows only mild anemia with a hemoglobin of 11.6  HPI  Past Medical History:  Diagnosis Date   Cancer ([place])    basal cell on face in 2000 or 2001   Hx of basal cell carcinoma 2002   right cheek    Hypertension    Osteopenia    Sinus congestion     Past Surgical History:  Procedure Laterality Date   BASAL CELL CARCINOMA EXCISION     TONSILLECTOMY AND ADENOIDECTOMY      Family History  Problem Relation Age of Onset   Cancer Mother        lung   Breast cancer Maternal Aunt     Social History Social History   Tobacco Use   Smoking status: Never   Smokeless tobacco: Never  Vaping Use   Vaping Use: Never used  Substance Use Topics   Alcohol use: Yes    Comment: on occasion   Drug use: No    Allergies  Allergen Reactions   Dust Mite Extract     Current Outpatient Medications  Medication Sig Dispense Refill   atorvastatin (LIPITOR) 20 MG tablet TAKE ONE TABLET BY MOUTH DAILY 90 tablet 3   hydrochlorothiazide  (MICROZIDE) 12.5 MG capsule TAKE ONE CAPSULE BY MOUTH DAILY 90 capsule 1   ketoconazole (NIZORAL) 2 % cream Apply to the feet QHS. 60 g 1   levocetirizine (XYZAL) 5 MG tablet Take 1 tablet (5 mg total) by mouth every evening. 90 tablet 1   mometasone (ELOCON) 0.1 % lotion Apply topically.     mometasone (NASONEX) 50 MCG/ACT nasal spray Place 2 sprays into the nose daily.     omeprazole (PRILOSEC) 40 MG capsule Take 40 mg by mouth daily.     telmisartan (MICARDIS) 40 MG tablet TAKE ONE TABLET BY MOUTH DAILY 90 tablet 1   No current facility-administered medications for this visit.     Review of Systems Full ROS  was asked and was negative except for the information on the HPI  Physical Exam Blood pressure (!) 143/71, pulse 91, temperature 97.9 F (36.6 C), temperature source Oral, height '5\' 4"'$  (1.626 m), weight 141 lb 9.6 oz (64.2 kg), SpO2 99 %. CONSTITUTIONAL: NAD. Chaperone present EYES: Pupils are equal, round, , Sclera are non-icteric. EARS, NOSE, MOUTH AND THROAT:  The oral mucosa is pink and moist. Hearing is intact to voice. LYMPH NODES:  Lymph nodes in the neck are normal. RESPIRATORY:  Lungs are clear. There is  normal respiratory effort, with equal breath sounds bilaterally, and without pathologic use of accessory muscles. CHest wall specifically the sternum there is a slight prominence within the manubrium.  No definitive masses.   BREAST: There is no evidence of any palpable breast lesions.  There is no evidence of skin or nipple lesions.  No evidence of lymphadenopathy CARDIOVASCULAR: Heart is regular without murmurs, gallops, or rubs. GI: The abdomen is  soft, nontender, and nondistended. There are no palpable masses. There is no hepatosplenomegaly. There are normal bowel sounds in all quadrants. GU: Rectal deferred.   MUSCULOSKELETAL: Normal muscle strength and tone. No cyanosis or edema.   SKIN: Turgor is good and there are no pathologic skin lesions or  ulcers. NEUROLOGIC: Motor and sensation is grossly normal. Cranial nerves are grossly intact. PSYCH:  Oriented to person, place and time. Affect is normal.  Data Reviewed  I have personally reviewed the patient's imaging, laboratory findings and medical records.    Assessment/Plan Prominent manubrium.  This is likely a normal variant.  Given the fact that she noticed some growth I do want for her to do a work-up with a CT of the chest to make sure there is no evidence of any lesions within the sternum.  He is in agreement with this. Discussed with the patient in detail about my thought process.  It is unlikely that she has a neoplastic process but I do want to pursue further work-up.  There is no evidence of any breast lesions or any other abnormalities that would account for his chest wall prominence Please note that I have spent 60 minutes in this encounter including personally reviewing imaging studies, coordinating care, placing orders, performing appropriate documentation.   Caroleen Hamman, MD FACS General Surgeon 12/13/2021, 2:01 PM

## 2022-01-05 ENCOUNTER — Ambulatory Visit
Admission: RE | Admit: 2022-01-05 | Discharge: 2022-01-05 | Disposition: A | Discharge status: Home / Self Care | Payer: Medicare HMO | Source: Ambulatory Visit | Attending: Surgery | Admitting: Surgery

## 2022-01-05 DIAGNOSIS — R222 Localized swelling, mass and lump, trunk: Secondary | ICD-10-CM | POA: Diagnosis present

## 2022-01-05 LAB — POCT I-STAT CREATININE: Creatinine, Ser: 0.8 mg/dL (ref 0.44–1.00)

## 2022-01-05 MED ORDER — IOHEXOL 300 MG/ML  SOLN
75.0000 mL | Freq: Once | INTRAMUSCULAR | Status: AC | PRN
  Administered 2022-01-05: 75 mL via INTRAVENOUS

## 2022-01-09 ENCOUNTER — Telehealth: Payer: Self-pay

## 2022-01-09 NOTE — Telephone Encounter (Signed)
Per Dr.Pabon- CT scan normal-please keep scheduled appointment 01/15/22 @ 9:30. Patient verbalized understanding.

## 2022-01-10 ENCOUNTER — Ambulatory Visit (INDEPENDENT_AMBULATORY_CARE_PROVIDER_SITE_OTHER): Payer: Medicare HMO | Admitting: Internal Medicine

## 2022-01-10 ENCOUNTER — Encounter: Payer: Self-pay | Admitting: Internal Medicine

## 2022-01-10 DIAGNOSIS — E7800 Pure hypercholesterolemia, unspecified: Secondary | ICD-10-CM

## 2022-01-10 DIAGNOSIS — D519 Vitamin B12 deficiency anemia, unspecified: Secondary | ICD-10-CM

## 2022-01-10 DIAGNOSIS — R7301 Impaired fasting glucose: Secondary | ICD-10-CM

## 2022-01-10 DIAGNOSIS — R5383 Other fatigue: Secondary | ICD-10-CM

## 2022-01-10 DIAGNOSIS — I10 Essential (primary) hypertension: Secondary | ICD-10-CM

## 2022-01-10 DIAGNOSIS — D649 Anemia, unspecified: Secondary | ICD-10-CM | POA: Diagnosis not present

## 2022-01-10 DIAGNOSIS — E041 Nontoxic single thyroid nodule: Secondary | ICD-10-CM | POA: Diagnosis not present

## 2022-01-10 DIAGNOSIS — Z0000 Encounter for general adult medical examination without abnormal findings: Secondary | ICD-10-CM

## 2022-01-10 DIAGNOSIS — Z1231 Encounter for screening mammogram for malignant neoplasm of breast: Secondary | ICD-10-CM | POA: Diagnosis not present

## 2022-01-10 DIAGNOSIS — I2584 Coronary atherosclerosis due to calcified coronary lesion: Secondary | ICD-10-CM

## 2022-01-10 LAB — CBC WITH DIFFERENTIAL/PLATELET
Basophils Absolute: 0 10*3/uL (ref 0.0–0.1)
Basophils Relative: 0.7 % (ref 0.0–3.0)
Eosinophils Absolute: 0.2 10*3/uL (ref 0.0–0.7)
Eosinophils Relative: 3.4 % (ref 0.0–5.0)
HCT: 34.4 % — ABNORMAL LOW (ref 36.0–46.0)
Hemoglobin: 11.7 g/dL — ABNORMAL LOW (ref 12.0–15.0)
Lymphocytes Relative: 20.3 % (ref 12.0–46.0)
Lymphs Abs: 1.2 10*3/uL (ref 0.7–4.0)
MCHC: 34 g/dL (ref 30.0–36.0)
MCV: 89 fl (ref 78.0–100.0)
Monocytes Absolute: 0.5 10*3/uL (ref 0.1–1.0)
Monocytes Relative: 9.1 % (ref 3.0–12.0)
Neutro Abs: 4 10*3/uL (ref 1.4–7.7)
Neutrophils Relative %: 66.5 % (ref 43.0–77.0)
Platelets: 251 10*3/uL (ref 150.0–400.0)
RBC: 3.86 Mil/uL — ABNORMAL LOW (ref 3.87–5.11)
RDW: 13.6 % (ref 11.5–15.5)
WBC: 6 10*3/uL (ref 4.0–10.5)

## 2022-01-10 LAB — IBC + FERRITIN
Ferritin: 78.9 ng/mL (ref 10.0–291.0)
Iron: 73 ug/dL (ref 42–145)
Saturation Ratios: 24.5 % (ref 20.0–50.0)
TIBC: 298.2 ug/dL (ref 250.0–450.0)
Transferrin: 213 mg/dL (ref 212.0–360.0)

## 2022-01-10 LAB — TSH: TSH: 0.91 u[IU]/mL (ref 0.35–5.50)

## 2022-01-10 LAB — COMPREHENSIVE METABOLIC PANEL
ALT: 14 U/L (ref 0–35)
AST: 20 U/L (ref 0–37)
Albumin: 4 g/dL (ref 3.5–5.2)
Alkaline Phosphatase: 93 U/L (ref 39–117)
BUN: 18 mg/dL (ref 6–23)
CO2: 30 mEq/L (ref 19–32)
Calcium: 9.1 mg/dL (ref 8.4–10.5)
Chloride: 106 mEq/L (ref 96–112)
Creatinine, Ser: 0.77 mg/dL (ref 0.40–1.20)
GFR: 78.76 mL/min (ref >60.00)
Glucose, Bld: 69 mg/dL — ABNORMAL LOW (ref 70–99)
Potassium: 4 mEq/L (ref 3.5–5.1)
Sodium: 140 mEq/L (ref 135–145)
Total Bilirubin: 0.4 mg/dL (ref 0.2–1.2)
Total Protein: 6.2 g/dL (ref 6.0–8.3)

## 2022-01-10 LAB — B12 AND FOLATE PANEL
Folate: >23.8 ng/mL (ref >5.9)
Vitamin B-12: 300 pg/mL (ref 211–911)

## 2022-01-10 LAB — HEMOGLOBIN A1C: Hgb A1c MFr Bld: 5.8 % (ref 4.6–6.5)

## 2022-01-10 NOTE — Patient Instructions (Signed)
I have ordered a few additional labs to investigate your very mild anemia.   Your annual mammogram has  been ordered.  Please call [place] to call to make your appointments  .  The phone number for [place] is  [phone]

## 2022-01-10 NOTE — Progress Notes (Unsigned)
Patient ID: Terri Melendez, female    DOB: 04-28-1950  Age: 71 y.o. MRN: [id number]  The patient is here for annual preventive examination and management of other chronic and acute problems.   The risk factors are reflected in the social history.  The roster of all physicians providing medical care to patient - is listed in the Snapshot section of the chart.  Activities of daily living:  The patient is 100% independent in all ADLs: dressing, toileting, feeding as well as independent mobility  Home safety : The patient has smoke detectors in the home. They wear seatbelts.  There are no firearms at home. There is no violence in the home.   There is no risks for hepatitis, STDs or HIV. There is no   history of blood transfusion. They have no travel history to infectious disease endemic areas of the world.  The patient has seen their dentist in the last six month. They have seen their eye doctor in the last year. They admit to slight hearing difficulty with regard to whispered voices and some television programs.  They have deferred audiologic testing in the last year.  They do not  have excessive sun exposure. Discussed the need for sun protection: hats, long sleeves and use of sunscreen if there is significant sun exposure.   Diet: the importance of a healthy diet is discussed. They do have a healthy diet.  The benefits of regular aerobic exercise were discussed. She walks 4 times per week ,  20 minutes.   Depression screen: there are no signs or vegative symptoms of depression- irritability, change in appetite, anhedonia, sadness/tearfullness.  Cognitive assessment: the patient manages all their financial and personal affairs and is actively engaged. They could relate day,date,year and events; recalled 2/3 objects at 3 minutes; performed clock-face test normally.  The following portions of the patient's history were reviewed and updated as appropriate: allergies, current medications, past  family history, past medical history,  past surgical history, past social history  and problem list.  Visual acuity was not assessed per patient preference since she has regular follow up with her ophthalmologist. Hearing and body mass index were assessed and reviewed.   During the course of the visit the patient was educated and counseled about appropriate screening and preventive services including : fall prevention , diabetes screening, nutrition counseling, colorectal cancer screening, and recommended immunizations.    CC: The primary encounter diagnosis was Encounter for screening mammogram for malignant neoplasm of breast. Diagnoses of Essential hypertension, Hypercholesteremia, Non-functional thyroid nodule, Impaired fasting glucose, and Other fatigue were also pertinent to this visit.   1) aortic atherosclerosis:  reviewed  recent CT chest ordered by [facility] for self discovered lump on chest (ct chest negative) .  She is tolerating atorvastatin  2)  History Terri Melendez has a past medical history of Cancer (Terri Melendez), basal cell carcinoma (2002), Hypertension, Osteopenia, and Sinus congestion.   She has a past surgical history that includes Tonsillectomy and adenoidectomy and Excision basal cell carcinoma.   Her family history includes Breast cancer in her maternal aunt; Cancer in her mother.She reports that she has never smoked. She has never used smokeless tobacco. She reports current alcohol use. She reports that she does not use drugs.  Outpatient Medications Prior to Visit  Medication Sig Dispense Refill   atorvastatin (LIPITOR) 20 MG tablet TAKE ONE TABLET BY MOUTH DAILY 90 tablet 3   hydrochlorothiazide (MICROZIDE) 12.5 MG capsule TAKE ONE CAPSULE BY MOUTH DAILY 90 capsule 1  ketoconazole (NIZORAL) 2 % cream Apply to the feet QHS. 60 g 1   levocetirizine (XYZAL) 5 MG tablet Take 1 tablet (5 mg total) by mouth every evening. 90 tablet 1   mometasone (ELOCON) 0.1 % lotion Apply topically.      mometasone (NASONEX) 50 MCG/ACT nasal spray Place 2 sprays into the nose daily.     omeprazole (PRILOSEC) 40 MG capsule Take 40 mg by mouth daily.     telmisartan (MICARDIS) 40 MG tablet TAKE ONE TABLET BY MOUTH DAILY 90 tablet 1   No facility-administered medications prior to visit.    Review of Systems  Objective:  BP 126/60 (BP Location: Left Arm, Patient Position: Sitting, Cuff Size: Normal)   Pulse 70   Temp 98.6 F (37 C) (Oral)   Ht '5\' 4"'$  (1.626 m)   Wt 144 lb (65.3 kg)   SpO2 98%   BMI 24.72 kg/m   Physical Exam  Physical Exam   Assessment & Plan:   Problem List Items Addressed This Visit     Essential hypertension   Hypercholesteremia   Non-functional thyroid nodule   Other Visit Diagnoses     Encounter for screening mammogram for malignant neoplasm of breast    -  Primary   Impaired fasting glucose       Other fatigue           I am having Terri Melendez "Terri Melendez" maintain her mometasone, omeprazole, mometasone, ketoconazole, levocetirizine, telmisartan, hydrochlorothiazide, and atorvastatin.    I provided 40 minutes of  face-to-face time during this encounter reviewing patient's current problems and past surgeries,  recent labs and imaging studies, providing counseling on the above mentioned problems , and coordination  of care .   Follow-up: No follow-ups on file.   Crecencio Mc, MD

## 2022-01-10 NOTE — Assessment & Plan Note (Signed)
Etiology unclear.  Mild.  Normal colonoscopy August 2020.  Checking iron, MM screen.  B23f folate and thyroid

## 2022-01-10 NOTE — Assessment & Plan Note (Signed)
Asymptomatic. Continue atorvastatin.

## 2022-01-11 NOTE — Assessment & Plan Note (Signed)

## 2022-01-12 LAB — PROTEIN ELECTROPHORESIS, SERUM
Albumin ELP: 3.9 g/dL (ref 3.8–4.8)
Alpha 1: 0.3 g/dL (ref 0.2–0.3)
Alpha 2: 0.6 g/dL (ref 0.5–0.9)
Beta 2: 0.3 g/dL (ref 0.2–0.5)
Beta Globulin: 0.4 g/dL (ref 0.4–0.6)
Gamma Globulin: 0.7 g/dL — ABNORMAL LOW (ref 0.8–1.7)
Total Protein: 6.2 g/dL (ref 6.1–8.1)

## 2022-01-12 LAB — IFE AND PE, RANDOM URINE
% BETA, Urine: 0 %
ALBUMIN, U: 0 %
ALPHA 1 URINE: 0 %
ALPHA-2-GLOBULIN, U: 0 %
GAMMA GLOBULIN URINE: 0 %
Protein, Ur: <4 mg/dL

## 2022-01-12 NOTE — Addendum Note (Signed)
Addended by: Crecencio Mc on: 01/12/2022 01:10 PM   Modules accepted: Orders

## 2022-01-14 NOTE — Addendum Note (Signed)
Addended by: Crecencio Mc on: 01/14/2022 05:51 PM   Modules accepted: Orders

## 2022-01-15 ENCOUNTER — Encounter: Payer: Self-pay | Admitting: Internal Medicine

## 2022-01-15 ENCOUNTER — Ambulatory Visit: Payer: Medicare HMO | Admitting: Surgery

## 2022-01-15 ENCOUNTER — Ambulatory Visit (INDEPENDENT_AMBULATORY_CARE_PROVIDER_SITE_OTHER): Payer: Medicare HMO

## 2022-01-15 DIAGNOSIS — E538 Deficiency of other specified B group vitamins: Secondary | ICD-10-CM

## 2022-01-15 MED ORDER — CYANOCOBALAMIN 1000 MCG/ML IJ SOLN
1000.0000 ug | Freq: Once | INTRAMUSCULAR | Status: AC
  Administered 2022-01-15: 1000 ug via INTRAMUSCULAR

## 2022-01-15 NOTE — Progress Notes (Addendum)
Pt presented for her vitamin B12 injection. Pt was identified through two identifiers, she tolerated injection well in the left deltoid.  [place], CMA

## 2022-01-23 ENCOUNTER — Ambulatory Visit (INDEPENDENT_AMBULATORY_CARE_PROVIDER_SITE_OTHER): Payer: Medicare HMO

## 2022-01-23 DIAGNOSIS — E538 Deficiency of other specified B group vitamins: Secondary | ICD-10-CM

## 2022-01-23 MED ORDER — CYANOCOBALAMIN 1000 MCG/ML IJ SOLN
1000.0000 ug | Freq: Once | INTRAMUSCULAR | Status: AC
  Administered 2022-01-23: 1000 ug via INTRAMUSCULAR

## 2022-01-23 NOTE — Progress Notes (Signed)
Pt presented for their vitamin B12 injection. Pt was identified through two identifiers. Pt tolerated shot well in their right deltoid.  

## 2022-01-30 ENCOUNTER — Ambulatory Visit: Payer: Medicare HMO

## 2022-01-30 ENCOUNTER — Ambulatory Visit (INDEPENDENT_AMBULATORY_CARE_PROVIDER_SITE_OTHER): Payer: Medicare HMO

## 2022-01-30 DIAGNOSIS — E538 Deficiency of other specified B group vitamins: Secondary | ICD-10-CM | POA: Diagnosis not present

## 2022-01-30 MED ORDER — CYANOCOBALAMIN 1000 MCG/ML IJ SOLN
1000.0000 ug | Freq: Once | INTRAMUSCULAR | Status: AC
  Administered 2022-01-30: 1000 ug via INTRAMUSCULAR

## 2022-01-30 NOTE — Progress Notes (Signed)
Pt presented for their vitamin B12 injection. Pt was identified through two identifiers. Pt tolerated shot well in their left deltoid.  Pt stated this was 3/3 ordered B12 shots.

## 2022-02-09 ENCOUNTER — Telehealth: Payer: Self-pay

## 2022-02-09 NOTE — Telephone Encounter (Signed)
Spoke with pt and she stated that she started feeling bad on Tuesday morning, she woke up hoarse. On Wednesday she developed a sore throat and Thursday she started with a fever of 101 and tested positive for covid. She also has head congestion, dry cough and headache. Pt stated that the only thing she has tried is flonase nasal pray. Pt would like to know if there is any thing else OTC that she could take to help with symptoms. Pt would like to try to control symptoms without taking the antiviral.

## 2022-02-09 NOTE — Telephone Encounter (Signed)
Pt is aware of the message below and gave a verbal understanding.

## 2022-02-09 NOTE — Telephone Encounter (Signed)
Pt called in stating she has COVID and would liek some recommendations on what to take or do about her stuffy nose and tickly throat.

## 2022-02-14 ENCOUNTER — Ambulatory Visit: Payer: Medicare HMO | Admitting: Dermatology

## 2022-03-11 ENCOUNTER — Other Ambulatory Visit: Payer: Self-pay | Admitting: Internal Medicine

## 2022-03-19 ENCOUNTER — Ambulatory Visit: Payer: Medicare HMO | Admitting: Dermatology

## 2022-03-19 DIAGNOSIS — L719 Rosacea, unspecified: Secondary | ICD-10-CM

## 2022-03-19 DIAGNOSIS — Z1283 Encounter for screening for malignant neoplasm of skin: Secondary | ICD-10-CM | POA: Diagnosis not present

## 2022-03-19 DIAGNOSIS — Z79899 Other long term (current) drug therapy: Secondary | ICD-10-CM

## 2022-03-19 DIAGNOSIS — L578 Other skin changes due to chronic exposure to nonionizing radiation: Secondary | ICD-10-CM | POA: Diagnosis not present

## 2022-03-19 DIAGNOSIS — L570 Actinic keratosis: Secondary | ICD-10-CM | POA: Diagnosis not present

## 2022-03-19 DIAGNOSIS — L814 Other melanin hyperpigmentation: Secondary | ICD-10-CM

## 2022-03-19 DIAGNOSIS — L821 Other seborrheic keratosis: Secondary | ICD-10-CM

## 2022-03-19 DIAGNOSIS — B353 Tinea pedis: Secondary | ICD-10-CM | POA: Diagnosis not present

## 2022-03-19 DIAGNOSIS — L820 Inflamed seborrheic keratosis: Secondary | ICD-10-CM

## 2022-03-19 DIAGNOSIS — D229 Melanocytic nevi, unspecified: Secondary | ICD-10-CM

## 2022-03-19 DIAGNOSIS — Z85828 Personal history of other malignant neoplasm of skin: Secondary | ICD-10-CM

## 2022-03-19 DIAGNOSIS — D692 Other nonthrombocytopenic purpura: Secondary | ICD-10-CM

## 2022-03-19 NOTE — Patient Instructions (Signed)
Due to recent changes in healthcare laws, you may see results of your pathology and/or laboratory studies on MyChart before the doctors have had a chance to review them. We understand that in some cases there may be results that are confusing or concerning to you. Please understand that not all results are received at the same time and often the doctors may need to interpret multiple results in order to provide you with the best plan of care or course of treatment. Therefore, we ask that you please give us 2 business days to thoroughly review all your results before contacting the office for clarification. Should we see a critical lab result, you will be contacted sooner.   If You Need Anything After Your Visit  If you have any questions or concerns for your doctor, please call our main line at [phone] and press option 4 to reach your doctor's medical assistant. If no one answers, please leave a voicemail as directed and we will return your call as soon as possible. Messages left after 4 pm will be answered the following business day.   You may also send us a message via MyChart. We typically respond to MyChart messages within 1-2 business days.  For prescription refills, please ask your pharmacy to contact our office. Our fax number is [phone].  If you have an urgent issue when the clinic is closed that cannot wait until the next business day, you can page your doctor at the number below.    Please note that while we do our best to be available for urgent issues outside of office hours, we are not available 24/7.   If you have an urgent issue and are unable to reach us, you may choose to seek medical care at your doctor's office, retail clinic, urgent care center, or emergency room.  If you have a medical emergency, please immediately call 911 or go to the emergency department.  Pager Numbers  - Dr. Kowalski: [phone]  - Dr. Moye: [phone]  - Dr. Stewart:  [phone]  In the event of inclement weather, please call our main line at [phone] for an update on the status of any delays or closures.  Dermatology Medication Tips: Please keep the boxes that topical medications come in in order to help keep track of the instructions about where and how to use these. Pharmacies typically print the medication instructions only on the boxes and not directly on the medication tubes.   If your medication is too expensive, please contact our office at [phone] option 4 or send us a message through MyChart.   We are unable to tell what your co-pay for medications will be in advance as this is different depending on your insurance coverage. However, we may be able to find a substitute medication at lower cost or fill out paperwork to get insurance to cover a needed medication.   If a prior authorization is required to get your medication covered by your insurance company, please allow us 1-2 business days to complete this process.  Drug prices often vary depending on where the prescription is filled and some pharmacies may offer cheaper prices.  The website www.goodrx.com contains coupons for medications through different pharmacies. The prices here do not account for what the cost may be with help from insurance (it may be cheaper with your insurance), but the website can give you the price if you did not use any insurance.  - You can print the associated coupon and take it with   your prescription to the pharmacy.  - You may also stop by our office during regular business hours and pick up a GoodRx coupon card.  - If you need your prescription sent electronically to a different pharmacy, notify our office through [facility] MyChart or by phone at [phone] option 4.     Si Usted Necesita Algo Despus de Su Visita  Tambin puede enviarnos un mensaje a travs de MyChart. Por lo general respondemos a los mensajes de MyChart en el transcurso de 1 a 2  das hbiles.  Para renovar recetas, por favor pida a su farmacia que se ponga en contacto con nuestra oficina. Nuestro nmero de fax es el [phone].  Si tiene un asunto urgente cuando la clnica est cerrada y que no puede esperar hasta el siguiente da hbil, puede llamar/localizar a su doctor(a) al nmero que aparece a continuacin.   Por favor, tenga en cuenta que aunque hacemos todo lo posible para estar disponibles para asuntos urgentes fuera del horario de oficina, no estamos disponibles las 24 horas del da, los 7 das de la semana.   Si tiene un problema urgente y no puede comunicarse con nosotros, puede optar por buscar atencin mdica  en el consultorio de su doctor(a), en una clnica privada, en un centro de atencin urgente o en una sala de emergencias.  Si tiene una emergencia mdica, por favor llame inmediatamente al 911 o vaya a la sala de emergencias.  Nmeros de bper  - Dr. Kowalski: [phone]  - Dra. Moye: [phone]  - Dra. Stewart: [phone]  En caso de inclemencias del tiempo, por favor llame a nuestra lnea principal al [phone] para una actualizacin sobre el estado de cualquier retraso o cierre.  Consejos para la medicacin en dermatologa: Por favor, guarde las cajas en las que vienen los medicamentos de uso tpico para ayudarle a seguir las instrucciones sobre dnde y cmo usarlos. Las farmacias generalmente imprimen las instrucciones del medicamento slo en las cajas y no directamente en los tubos del medicamento.   Si su medicamento es muy caro, por favor, pngase en contacto con nuestra oficina llamando al [phone] y presione la opcin 4 o envenos un mensaje a travs de MyChart.   No podemos decirle cul ser su copago por los medicamentos por adelantado ya que esto es diferente dependiendo de la cobertura de su seguro. Sin embargo, es posible que podamos encontrar un medicamento sustituto a menor costo o llenar un formulario para que el  seguro cubra el medicamento que se considera necesario.   Si se requiere una autorizacin previa para que su compaa de seguros cubra su medicamento, por favor permtanos de 1 a 2 das hbiles para completar este proceso.  Los precios de los medicamentos varan con frecuencia dependiendo del lugar de dnde se surte la receta y alguna farmacias pueden ofrecer precios ms baratos.  El sitio web www.goodrx.com tiene cupones para medicamentos de diferentes farmacias. Los precios aqu no tienen en cuenta lo que podra costar con la ayuda del seguro (puede ser ms barato con su seguro), pero el sitio web puede darle el precio si no utiliz ningn seguro.  - Puede imprimir el cupn correspondiente y llevarlo con su receta a la farmacia.  - Tambin puede pasar por nuestra oficina durante el horario de atencin regular y recoger una tarjeta de cupones de GoodRx.  - Si necesita que su receta se enve electrnicamente a una farmacia diferente, informe a nuestra oficina a travs de MyChart de [facility]   o por telfono llamando al [phone] y presione la opcin 4.  

## 2022-03-19 NOTE — Progress Notes (Signed)
Follow-Up Visit   Subjective  Terri Melendez is a 72 y.o. female who presents for the following: Annual Exam (Hx BCC). The patient presents for Total-Body Skin Exam (TBSE) for skin cancer screening and mole check.  The patient has spots, moles and lesions to be evaluated, some may be new or changing and the patient has concerns that these could be cancer.  The following portions of the chart were reviewed this encounter and updated as appropriate:   Tobacco  Allergies  Meds  Problems  Med Hx  Surg Hx  Fam Hx     Review of Systems:  No other skin or systemic complaints except as noted in HPI or Assessment and Plan.  Objective  Well appearing patient in no apparent distress; mood and affect are within normal limits.  A full examination was performed including scalp, head, eyes, ears, nose, lips, neck, chest, axillae, abdomen, back, buttocks, bilateral upper extremities, bilateral lower extremities, hands, feet, fingers, toes, fingernails, and toenails. All findings within normal limits unless otherwise noted below.  Nose x 1 Erythematous thin papules/macules with gritty scale.   Nose Clear today.  R neck x 1 Erythematous stuck-on, waxy papule or plaque  B/L foot Scaling and maceration web spaces and over distal and lateral soles.    Assessment & Plan  AK (actinic keratosis) Nose x 1 Destruction of lesion - Nose x 1 Complexity: simple   Destruction method: cryotherapy   Informed consent: discussed and consent obtained   Timeout:  patient name, date of birth, surgical site, and procedure verified Lesion destroyed using liquid nitrogen: Yes   Region frozen until ice ball extended beyond lesion: Yes   Outcome: patient tolerated procedure well with no complications   Post-procedure details: wound care instructions given    Rosacea Nose Pt reports hx of breakouts on the nose -  Rosacea is a chronic progressive skin condition usually affecting the face of adults,  causing redness and/or acne bumps. It is treatable but not curable. It sometimes affects the eyes (ocular rosacea) as well. It may respond to topical and/or systemic medication and can flare with stress, sun exposure, alcohol, exercise, topical steroids (including hydrocortisone/cortisone 10) and some foods.  Daily application of broad spectrum spf 30+ sunscreen to face is recommended to reduce flares.  Clear today, no tx needed, but if breakouts continue to occur will plan to treat.  Inflamed seborrheic keratosis R neck x 1 Symptomatic, irritating, patient would like treated. Destruction of lesion - R neck x 1 Complexity: simple   Destruction method: cryotherapy   Informed consent: discussed and consent obtained   Timeout:  patient name, date of birth, surgical site, and procedure verified Lesion destroyed using liquid nitrogen: Yes   Region frozen until ice ball extended beyond lesion: Yes   Outcome: patient tolerated procedure well with no complications   Post-procedure details: wound care instructions given    Tinea pedis of both feet B/L foot Chronic and persistent condition with duration or expected duration over one year. Condition is symptomatic / bothersome to patient. Not to goal. Continue Ketoconazole 2% cream QHS.   Related Medications ketoconazole (NIZORAL) 2 % cream Apply to the feet QHS.  Lentigines - Scattered tan macules - Due to sun exposure - Benign-appearing, observe - Recommend daily broad spectrum sunscreen SPF 30+ to sun-exposed areas, reapply every 2 hours as needed. - Call for any changes  Seborrheic Keratoses - Stuck-on, waxy, tan-brown papules and/or plaques  - Benign-appearing - Discussed benign etiology  and prognosis. - Observe - Call for any changes  Melanocytic Nevi - Tan-brown and/or pink-flesh-colored symmetric macules and papules - Benign appearing on exam today - Observation - Call clinic for new or changing moles - Recommend daily use  of broad spectrum spf 30+ sunscreen to sun-exposed areas.   Hemangiomas - Red papules - Discussed benign nature - Observe - Call for any changes  Actinic Damage - Chronic condition, secondary to cumulative UV/sun exposure - diffuse scaly erythematous macules with underlying dyspigmentation - Recommend daily broad spectrum sunscreen SPF 30+ to sun-exposed areas, reapply every 2 hours as needed.  - Staying in the shade or wearing long sleeves, sun glasses (UVA+UVB protection) and wide brim hats (4-inch brim around the entire circumference of the hat) are also recommended for sun protection.  - Call for new or changing lesions.  History of Basal Cell Carcinoma of the Skin - No evidence of recurrence today - Recommend regular full body skin exams - Recommend daily broad spectrum sunscreen SPF 30+ to sun-exposed areas, reapply every 2 hours as needed.  - Call if any new or changing lesions are noted between office visits  Purpura - Chronic; persistent and recurrent.  Treatable, but not curable. - Violaceous macules and patches - Benign - Related to trauma, age, sun damage and/or use of blood thinners, chronic use of topical and/or oral steroids - Observe - Can use OTC arnica containing moisturizer such as Dermend Bruise Formula if desired - Call for worsening or other concerns  Skin cancer screening performed today.  Return in about 1 year (around 03/20/2023) for TBSE.  Luther Redo, CMA, am acting as scribe for Sarina Ser, MD . Documentation: I have reviewed the above documentation for accuracy and completeness, and I agree with the above.  Sarina Ser, MD

## 2022-03-21 ENCOUNTER — Ambulatory Visit
Admission: RE | Admit: 2022-03-21 | Discharge: 2022-03-21 | Disposition: A | Discharge status: Home / Self Care | Payer: Medicare HMO | Source: Ambulatory Visit | Attending: Internal Medicine | Admitting: Internal Medicine

## 2022-03-21 DIAGNOSIS — Z1231 Encounter for screening mammogram for malignant neoplasm of breast: Secondary | ICD-10-CM | POA: Diagnosis not present

## 2022-03-28 ENCOUNTER — Encounter: Payer: Self-pay | Admitting: Dermatology

## 2022-03-28 DIAGNOSIS — M722 Plantar fascial fibromatosis: Secondary | ICD-10-CM | POA: Insufficient documentation

## 2022-05-08 ENCOUNTER — Encounter: Payer: Self-pay | Admitting: Podiatry

## 2022-05-08 ENCOUNTER — Ambulatory Visit: Payer: Medicare HMO | Admitting: Podiatry

## 2022-05-08 DIAGNOSIS — M722 Plantar fascial fibromatosis: Secondary | ICD-10-CM | POA: Diagnosis not present

## 2022-05-08 MED ORDER — TRIAMCINOLONE ACETONIDE 40 MG/ML IJ SUSP
20.0000 mg | Freq: Once | INTRAMUSCULAR | Status: AC
  Administered 2022-05-08: 20 mg

## 2022-05-08 MED ORDER — METHYLPREDNISOLONE 4 MG PO TBPK: ORAL_TABLET | ORAL | 0 refills | Status: DC

## 2022-05-08 NOTE — Progress Notes (Signed)
Subjective:  Patient ID: Terri Melendez, female    DOB: 1950-07-20,  MRN: [id number] HPI Chief Complaint  Patient presents with   Foot Pain    Plantar heel left - aching x 6 weeks, AM pain, [facility] eval-xrayed, NSAIDS, exercises, heel cups-no help, now burning and takes Tylenol PRN   New Patient (Initial Visit)    Est pt 2019    72 y.o. female presents with the above complaint.   ROS: Denies fever chills nausea vomit muscle aches pains calf pain back pain chest pain shortness of breath.  Past Medical History:  Diagnosis Date   Cancer ([place])    basal cell on face in 2000 or 2001   Hx of basal cell carcinoma 2002   right cheek    Hypertension    Osteopenia    Sinus congestion    Past Surgical History:  Procedure Laterality Date   BASAL CELL CARCINOMA EXCISION     TONSILLECTOMY AND ADENOIDECTOMY      Current Outpatient Medications:    methylPREDNISolone (MEDROL DOSEPAK) 4 MG TBPK tablet, 6 day dose pack - take as directed, Disp: 21 tablet, Rfl: 0   atorvastatin (LIPITOR) 20 MG tablet, TAKE ONE TABLET BY MOUTH DAILY, Disp: 90 tablet, Rfl: 3   hydrochlorothiazide (MICROZIDE) 12.5 MG capsule, TAKE 1 CAPSULE BY MOUTH DAILY, Disp: 90 capsule, Rfl: 1   ketoconazole (NIZORAL) 2 % cream, Apply to the feet QHS., Disp: 60 g, Rfl: 1   mometasone (ELOCON) 0.1 % lotion, Apply topically. (Patient not taking: Reported on 03/19/2022), Disp: , Rfl:    mometasone (NASONEX) 50 MCG/ACT nasal spray, Place 2 sprays into the nose daily., Disp: , Rfl:    omeprazole (PRILOSEC) 40 MG capsule, Take 40 mg by mouth daily., Disp: , Rfl:    telmisartan (MICARDIS) 40 MG tablet, TAKE 1 TABLET BY MOUTH DAILY, Disp: 90 tablet, Rfl: 1  Allergies  Allergen Reactions   Dust Mite Extract    Review of Systems Objective:  There were no vitals filed for this visit.  General: Well developed, nourished, in no acute distress, alert and oriented x3   Dermatological: Skin is warm, dry and supple  bilateral. Nails x 10 are well maintained; remaining integument appears unremarkable at this time. There are no open sores, no preulcerative lesions, no rash or signs of infection present.  Vascular: Dorsalis Pedis artery and Posterior Tibial artery pedal pulses are 2/4 bilateral with immedate capillary fill time. Pedal hair growth present. No varicosities and no lower extremity edema present bilateral.   Neruologic: Grossly intact via light touch bilateral. Vibratory intact via tuning fork bilateral. Protective threshold with Semmes Wienstein monofilament intact to all pedal sites bilateral. Patellar and Achilles deep tendon reflexes 2+ bilateral. No Babinski or clonus noted bilateral.   Musculoskeletal: No gross boney pedal deformities bilateral. No pain, crepitus, or limitation noted with foot and ankle range of motion bilateral. Muscular strength 5/5 in all groups tested bilateral.  Moderate to severe pain on palpation MucoClear tubercle of the left heel.  Gait: Unassisted, Nonantalgic.    Radiographs:  Radiographs reviewed that she had with her on paper today.  Assessment & Plan:   Assessment: Planter fasciitis left foot.  Plan: Discussed etiology pathology conservative versus surgical therapies discussed appropriate shoe gear stretching exercise ice therapy sugar modifications.  Injected left heel today 20 mg Kenalog 5 mg Marcaine.  Started her on methylprednisolone.  She will start back on her meloxicam that she already has.  Garrel Ridgel, DPM

## 2022-05-23 ENCOUNTER — Encounter: Payer: Self-pay | Admitting: Internal Medicine

## 2022-06-14 ENCOUNTER — Encounter: Payer: Self-pay | Admitting: Podiatry

## 2022-06-14 MED ORDER — MELOXICAM 15 MG PO TABS: 15.0000 mg | ORAL_TABLET | Freq: Every day | ORAL | 3 refills | Status: DC

## 2022-06-18 ENCOUNTER — Ambulatory Visit: Payer: Medicare HMO | Admitting: Podiatry

## 2022-06-19 ENCOUNTER — Encounter: Payer: Self-pay | Admitting: Internal Medicine

## 2022-06-21 NOTE — Telephone Encounter (Signed)
If okay I will sent it in

## 2022-06-22 MED ORDER — CETIRIZINE HCL 10 MG PO TABS: 10.0000 mg | ORAL_TABLET | Freq: Every day | ORAL | 1 refills | Status: DC

## 2022-06-27 ENCOUNTER — Ambulatory Visit: Payer: Medicare HMO | Admitting: Podiatry

## 2022-06-27 DIAGNOSIS — M722 Plantar fascial fibromatosis: Secondary | ICD-10-CM | POA: Diagnosis not present

## 2022-06-27 MED ORDER — TRIAMCINOLONE ACETONIDE 40 MG/ML IJ SUSP
20.0000 mg | Freq: Once | INTRAMUSCULAR | Status: AC
  Administered 2022-06-27: 20 mg

## 2022-06-27 NOTE — Progress Notes (Signed)
Terri Melendez presents today for left foot pain she states that is much better since the last visit but I can still feel it as she refers to the plantar fasciitis.  Objective: Vital signs stable alert oriented x 3 much decrease in pain no erythema or induration on palpation of the heel.  Assessment: 50 to 80% resolution per patient plan fasciitis of the left heel.  Plan: Reinjected left heel today continue anti-inflammatories and I will follow-up with her in 1 month if necessary

## 2022-07-12 ENCOUNTER — Ambulatory Visit: Payer: Medicare HMO | Admitting: Internal Medicine

## 2022-07-19 ENCOUNTER — Ambulatory Visit (INDEPENDENT_AMBULATORY_CARE_PROVIDER_SITE_OTHER): Payer: Medicare HMO | Admitting: Internal Medicine

## 2022-07-19 ENCOUNTER — Encounter: Payer: Self-pay | Admitting: Internal Medicine

## 2022-07-19 VITALS — BP 118/58 | HR 80 | Ht 64.0 in | Wt 139.0 lb

## 2022-07-19 DIAGNOSIS — E7800 Pure hypercholesterolemia, unspecified: Secondary | ICD-10-CM | POA: Diagnosis not present

## 2022-07-19 DIAGNOSIS — I10 Essential (primary) hypertension: Secondary | ICD-10-CM

## 2022-07-19 DIAGNOSIS — E538 Deficiency of other specified B group vitamins: Secondary | ICD-10-CM

## 2022-07-19 DIAGNOSIS — E559 Vitamin D deficiency, unspecified: Secondary | ICD-10-CM

## 2022-07-19 DIAGNOSIS — D2322 Other benign neoplasm of skin of left ear and external auricular canal: Secondary | ICD-10-CM

## 2022-07-19 LAB — COMPREHENSIVE METABOLIC PANEL
ALT: 15 U/L (ref 0–35)
AST: 19 U/L (ref 0–37)
Albumin: 4 g/dL (ref 3.5–5.2)
Alkaline Phosphatase: 77 U/L (ref 39–117)
BUN: 22 mg/dL (ref 6–23)
CO2: 30 mEq/L (ref 19–32)
Calcium: 9.4 mg/dL (ref 8.4–10.5)
Chloride: 105 mEq/L (ref 96–112)
Creatinine, Ser: 0.97 mg/dL (ref 0.40–1.20)
GFR: 59.48 mL/min — ABNORMAL LOW (ref >60.00)
Glucose, Bld: 66 mg/dL — ABNORMAL LOW (ref 70–99)
Potassium: 3.8 mEq/L (ref 3.5–5.1)
Sodium: 140 mEq/L (ref 135–145)
Total Bilirubin: 0.6 mg/dL (ref 0.2–1.2)
Total Protein: 6.4 g/dL (ref 6.0–8.3)

## 2022-07-19 LAB — LIPID PANEL
Cholesterol: 154 mg/dL (ref 0–200)
HDL: 61.5 mg/dL (ref >39.00)
LDL Cholesterol: 81 mg/dL (ref 0–99)
NonHDL: 92.58
Total CHOL/HDL Ratio: 3
Triglycerides: 57 mg/dL (ref 0.0–149.0)
VLDL: 11.4 mg/dL (ref 0.0–40.0)

## 2022-07-19 LAB — MICROALBUMIN / CREATININE URINE RATIO
Creatinine,U: 125.9 mg/dL
Microalb Creat Ratio: 1 mg/g (ref 0.0–30.0)
Microalb, Ur: 1.2 mg/dL (ref 0.0–1.9)

## 2022-07-19 LAB — LDL CHOLESTEROL, DIRECT: Direct LDL: 82 mg/dL

## 2022-07-19 LAB — VITAMIN B12: Vitamin B-12: 367 pg/mL (ref 211–911)

## 2022-07-19 LAB — VITAMIN D 25 HYDROXY (VIT D DEFICIENCY, FRACTURES): VITD: 22.75 ng/mL — ABNORMAL LOW (ref 30.00–100.00)

## 2022-07-19 MED ORDER — CEPHALEXIN 500 MG PO CAPS: 500.0000 mg | ORAL_CAPSULE | Freq: Four times a day (QID) | ORAL | 0 refills | Status: DC

## 2022-07-19 MED ORDER — TELMISARTAN 40 MG PO TABS
40.0000 mg | ORAL_TABLET | Freq: Every day | ORAL | 1 refills | Status: DC
Start: 2022-07-19 — End: 2023-01-29

## 2022-07-19 MED ORDER — HYDROCHLOROTHIAZIDE 12.5 MG PO CAPS
12.5000 mg | ORAL_CAPSULE | Freq: Every day | ORAL | 1 refills | Status: DC
Start: 2022-07-19 — End: 2023-01-29

## 2022-07-19 NOTE — Patient Instructions (Addendum)
Let's treat the "bump " in your ear as a superficial infection.  Rx Cephalexin  4times daily   and apply warm compresses  as tolerated  Please take a probiotic ( Align, Floraque or Culturelle), the generic version of one of these over the counter medications, or   eat a serving of yogurt  for a minimum of 3 weeks to prevent a serious antibiotic associated diarrhea  Called clostridium dificile colitis.   Return in 10 days for repeat evaluation,   if still present,  ENT evaluation is recommended    [employer] [place]  [place]  The place on your thigh is not a mole.  It does not need evaluation

## 2022-07-19 NOTE — Progress Notes (Signed)
Subjective:  Patient ID: Terri Melendez, female    DOB: 1950/11/14  Age: 72 y.o. MRN: [id number]  CC: The primary encounter diagnosis was Essential hypertension. Diagnoses of Hypercholesteremia, B12 deficiency, Vitamin D deficiency, and Benign skin growth of ear, left were also pertinent to this visit.   HPI Terri Melendez presents for  Chief Complaint  Patient presents with  . Medical Management of Chronic Issues  . skin concern    2 places of concern, Left ear and Left thigh    1) HTN:  Hypertension: patient checks blood pressure twice weekly at home.  Readings have been for the most part < 140/80 at rest . Patient is following a reduce salt diet most days and is taking  telmisartan and hctz  as prescribed   2) non tender "bump" in left external ear canal, noted several weeks ago   scrached it accidentally and it became tender and swollen   3) irritated skin tag on medial thigh.  Was scratched accidentally   Outpatient Medications Prior to Visit  Medication Sig Dispense Refill  . atorvastatin (LIPITOR) 20 MG tablet TAKE ONE TABLET BY MOUTH DAILY 90 tablet 3  . cetirizine (ZYRTEC) 10 MG tablet Take 1 tablet (10 mg total) by mouth daily. 90 tablet 1  . ketoconazole (NIZORAL) 2 % cream Apply to the feet QHS. 60 g 1  . meloxicam (MOBIC) 15 MG tablet Take 1 tablet (15 mg total) by mouth daily. 30 tablet 3  . mometasone (ELOCON) 0.1 % lotion Apply topically.    . mometasone (NASONEX) 50 MCG/ACT nasal spray Place 2 sprays into the nose daily.    Marland Kitchen omeprazole (PRILOSEC) 40 MG capsule Take 40 mg by mouth daily.    . hydrochlorothiazide (MICROZIDE) 12.5 MG capsule TAKE 1 CAPSULE BY MOUTH DAILY 90 capsule 1  . methylPREDNISolone (MEDROL DOSEPAK) 4 MG TBPK tablet 6 day dose pack - take as directed 21 tablet 0  . telmisartan (MICARDIS) 40 MG tablet TAKE 1 TABLET BY MOUTH DAILY 90 tablet 1   No facility-administered medications prior to visit.    Review of Systems;  Patient  denies headache, fevers, malaise, unintentional weight loss, skin rash, eye pain, sinus congestion and sinus pain, sore throat, dysphagia,  hemoptysis , cough, dyspnea, wheezing, chest pain, palpitations, orthopnea, edema, abdominal pain, nausea, melena, diarrhea, constipation, flank pain, dysuria, hematuria, urinary  Frequency, nocturia, numbness, tingling, seizures,  Focal weakness, Loss of consciousness,  Tremor, insomnia, depression, anxiety, and suicidal ideation.      Objective:  BP (!) 118/58   Pulse 80   Ht 5\' 4"  (1.626 m)   Wt 139 lb (63 kg)   SpO2 99%   BMI 23.86 kg/m   BP Readings from Last 3 Encounters:  07/19/22 (!) 118/58  01/10/22 126/60  12/13/21 (!) 143/71    Wt Readings from Last 3 Encounters:  07/19/22 139 lb (63 kg)  01/10/22 144 lb (65.3 kg)  12/13/21 141 lb 9.6 oz (64.2 kg)    Physical Exam Vitals reviewed.  Constitutional:      General: She is not in acute distress.    Appearance: Normal appearance. She is normal weight. She is not ill-appearing, toxic-appearing or diaphoretic.  HENT:     Head: Normocephalic.     Left Ear: Swelling present.     Ears:      Comments: Hypopigmented papular lesion  Eyes:     General: No scleral icterus.       Right eye: No  discharge.        Left eye: No discharge.     Conjunctiva/sclera: Conjunctivae normal.  Cardiovascular:     Rate and Rhythm: Normal rate and regular rhythm.     Heart sounds: Normal heart sounds.  Pulmonary:     Effort: Pulmonary effort is normal. No respiratory distress.     Breath sounds: Normal breath sounds.  Musculoskeletal:        General: Normal range of motion.  Skin:    General: Skin is warm and dry.     Findings: Lesion present.          Comments: Excoriated skin tag   Neurological:     General: No focal deficit present.     Mental Status: She is alert and oriented to person, place, and time. Mental status is at baseline.  Psychiatric:        Mood and Affect: Mood normal.         Behavior: Behavior normal.        Thought Content: Thought content normal.        Judgment: Judgment normal.   Lab Results  Component Value Date   HGBA1C 5.8 01/10/2022    Lab Results  Component Value Date   CREATININE 0.97 07/19/2022   CREATININE 0.77 01/10/2022   CREATININE 0.80 01/05/2022    Lab Results  Component Value Date   WBC 6.0 01/10/2022   HGB 11.7 (L) 01/10/2022   HCT 34.4 (L) 01/10/2022   PLT 251.0 01/10/2022   GLUCOSE 66 (L) 07/19/2022   CHOL 154 07/19/2022   TRIG 57.0 07/19/2022   HDL 61.50 07/19/2022   LDLDIRECT 82.0 07/19/2022   LDLCALC 81 07/19/2022   ALT 15 07/19/2022   AST 19 07/19/2022   NA 140 07/19/2022   K 3.8 07/19/2022   CL 105 07/19/2022   CREATININE 0.97 07/19/2022   BUN 22 07/19/2022   CO2 30 07/19/2022   TSH 0.91 01/10/2022   HGBA1C 5.8 01/10/2022   MICROALBUR 1.2 07/19/2022    MM 3D SCREEN BREAST BILATERAL  Result Date: 03/22/2022 CLINICAL DATA:  Screening. EXAM: DIGITAL SCREENING BILATERAL MAMMOGRAM WITH TOMOSYNTHESIS AND CAD TECHNIQUE: Bilateral screening digital craniocaudal and mediolateral oblique mammograms were obtained. Bilateral screening digital breast tomosynthesis was performed. The images were evaluated with computer-aided detection. COMPARISON:  Previous exam(s). ACR Breast Density Category c: The breast tissue is heterogeneously dense, which may obscure small masses. FINDINGS: There are no findings suspicious for malignancy. IMPRESSION: No mammographic evidence of malignancy. A result letter of this screening mammogram will be mailed directly to the patient. RECOMMENDATION: Screening mammogram in one year. (Code:SM-B-01Y) BI-RADS CATEGORY  1: Negative. Electronically Signed   By: Meda Klinefelter M.D.   On: 03/22/2022 13:03    Assessment & Plan:  .Essential hypertension Assessment & Plan: Well controlled on current regimen of telmisartan and hctz. , no changes today.  Orders: -     Comprehensive metabolic panel -      Microalbumin / creatinine urine ratio  Hypercholesteremia Assessment & Plan: Managed with atorvastatin 20 mg daily without side effects.   Lab Results  Component Value Date   CHOL 154 07/19/2022   HDL 61.50 07/19/2022   LDLCALC 81 07/19/2022   LDLDIRECT 82.0 07/19/2022   TRIG 57.0 07/19/2022   CHOLHDL 3 07/19/2022     Orders: -     Lipid panel -     LDL cholesterol, direct  B12 deficiency -     Vitamin B12 -  Intrinsic Factor Antibodies; Future -     Methylmalonic acid, serum  Vitamin D deficiency -     VITAMIN D 25 Hydroxy (Vit-D Deficiency, Fractures)  Benign skin growth of ear, left Assessment & Plan: Will treat as infection ; rx keflex and hot compresses. Refer to dermatology  if it does not resolve.    Other orders -     hydroCHLOROthiazide; Take 1 capsule (12.5 mg total) by mouth daily.  Dispense: 90 capsule; Refill: 1 -     Telmisartan; Take 1 tablet (40 mg total) by mouth daily.  Dispense: 90 tablet; Refill: 1 -     Cephalexin; Take 1 capsule (500 mg total) by mouth 4 (four) times daily.  Dispense: 28 capsule; Refill: 0     I provided 30 minutes of face-to-face time during this encounter reviewing patient's last visit with me, patient's  most recent visit with cardiology,  nephrology,  and neurology,  recent surgical and non surgical procedures, previous  labs and imaging studies, counseling on currently addressed issues,  and post visit ordering to diagnostics and therapeutics .   Follow-up: Return in about 10 days (around 07/29/2022) for repeat evaluation of left ear.   Sherlene Shams, MD

## 2022-07-20 DIAGNOSIS — D2322 Other benign neoplasm of skin of left ear and external auricular canal: Secondary | ICD-10-CM | POA: Insufficient documentation

## 2022-07-20 NOTE — Assessment & Plan Note (Signed)
Managed with atorvastatin 20 mg daily without side effects.   Lab Results  Component Value Date   CHOL 154 07/19/2022   HDL 61.50 07/19/2022   LDLCALC 81 07/19/2022   LDLDIRECT 82.0 07/19/2022   TRIG 57.0 07/19/2022   CHOLHDL 3 07/19/2022

## 2022-07-20 NOTE — Assessment & Plan Note (Signed)
Will treat as infection ; rx keflex and hot compresses. Refer to dermatology  if it does not resolve.

## 2022-07-20 NOTE — Assessment & Plan Note (Signed)
Well controlled on current regimen of telmisartan and hctz. , no changes today.

## 2022-07-22 LAB — METHYLMALONIC ACID, SERUM: Methylmalonic Acid, Quant: 120 nmol/L (ref 87–318)

## 2022-07-25 ENCOUNTER — Encounter: Payer: Self-pay | Admitting: Internal Medicine

## 2022-07-25 ENCOUNTER — Ambulatory Visit: Payer: Medicare HMO | Admitting: Podiatry

## 2022-07-26 MED ORDER — CHOLECALCIFEROL 1.25 MG (50000 UT) PO CAPS: 50000.0000 [IU] | ORAL_CAPSULE | ORAL | 0 refills | Status: DC

## 2022-07-26 NOTE — Telephone Encounter (Signed)
Per result note message I have sent in the Vitamin D rx. Pt is aware.

## 2022-07-31 ENCOUNTER — Ambulatory Visit (INDEPENDENT_AMBULATORY_CARE_PROVIDER_SITE_OTHER): Payer: Medicare HMO | Admitting: Family Medicine

## 2022-07-31 ENCOUNTER — Encounter: Payer: Self-pay | Admitting: Family Medicine

## 2022-07-31 VITALS — BP 122/72 | HR 80 | Temp 98.2°F | Ht 64.0 in | Wt 140.0 lb

## 2022-07-31 DIAGNOSIS — D2322 Other benign neoplasm of skin of left ear and external auricular canal: Secondary | ICD-10-CM | POA: Diagnosis not present

## 2022-07-31 NOTE — Progress Notes (Signed)
  Marikay Alar, MD Phone: [phone]  Terri Melendez is a 72 y.o. female who presents today for same day visit.   Left ear lesion: Patient notes this was present on her last evaluation with Dr. Darrick Huntsman.  She was prescribed antibiotics for possible infection.  She notes that has reduced in size and now seems to just be a scab.  She just wanted somebody to look at it as she cannot see her ear.  She notes no pain.  Social History   Tobacco Use  Smoking Status Never  Smokeless Tobacco Never    Current Outpatient Medications on File Prior to Visit  Medication Sig Dispense Refill   atorvastatin (LIPITOR) 20 MG tablet TAKE ONE TABLET BY MOUTH DAILY 90 tablet 3   cetirizine (ZYRTEC) 10 MG tablet Take 1 tablet (10 mg total) by mouth daily. 90 tablet 1   Cholecalciferol 1.25 MG (50000 UT) capsule Take 1 capsule (50,000 Units total) by mouth once a week. 4 capsule 0   hydrochlorothiazide (MICROZIDE) 12.5 MG capsule Take 1 capsule (12.5 mg total) by mouth daily. 90 capsule 1   ketoconazole (NIZORAL) 2 % cream Apply to the feet QHS. 60 g 1   meloxicam (MOBIC) 15 MG tablet Take 1 tablet (15 mg total) by mouth daily. 30 tablet 3   mometasone (ELOCON) 0.1 % lotion Apply topically.     mometasone (NASONEX) 50 MCG/ACT nasal spray Place 2 sprays into the nose daily.     telmisartan (MICARDIS) 40 MG tablet Take 1 tablet (40 mg total) by mouth daily. 90 tablet 1   No current facility-administered medications on file prior to visit.     ROS see history of present illness  Objective  Physical Exam Vitals:   07/31/22 1131  BP: 122/72  Pulse: 80  Temp: 98.2 F (36.8 C)  SpO2: 99%    BP Readings from Last 3 Encounters:  07/31/22 122/72  07/19/22 (!) 118/58  01/10/22 126/60   Wt Readings from Last 3 Encounters:  07/31/22 140 lb (63.5 kg)  07/19/22 139 lb (63 kg)  01/10/22 144 lb (65.3 kg)    Physical Exam  Nontender, no palpable underlying lesions other than the  scab   Assessment/Plan: Please see individual problem list.  Benign skin growth of ear, left Assessment & Plan: Much improved.  Possibly related to infection.  Discussed monitoring and if the scab does not resolve in the near future she should let us know so we can refer to dermatology.  If she has recurrence of the lesion she will let us know as well.     Return if symptoms worsen or fail to improve.   Marikay Alar, MD [facility]

## 2022-07-31 NOTE — Assessment & Plan Note (Signed)
Much improved.  Possibly related to infection.  Discussed monitoring and if the scab does not resolve in the near future she should let us know so we can refer to dermatology.  If she has recurrence of the lesion she will let us know as well.

## 2022-08-01 ENCOUNTER — Ambulatory Visit: Payer: Medicare HMO | Admitting: Podiatry

## 2022-08-02 ENCOUNTER — Ambulatory Visit: Payer: Medicare HMO | Admitting: Dermatology

## 2022-08-02 DIAGNOSIS — L820 Inflamed seborrheic keratosis: Secondary | ICD-10-CM

## 2022-08-02 DIAGNOSIS — L719 Rosacea, unspecified: Secondary | ICD-10-CM

## 2022-08-02 DIAGNOSIS — L821 Other seborrheic keratosis: Secondary | ICD-10-CM

## 2022-08-02 MED ORDER — METRONIDAZOLE 0.75 % EX CREA: TOPICAL_CREAM | CUTANEOUS | 0 refills | Status: DC

## 2022-08-02 MED ORDER — DOXYCYCLINE HYCLATE 20 MG PO TABS: 20.0000 mg | ORAL_TABLET | Freq: Two times a day (BID) | ORAL | 1 refills | Status: AC

## 2022-08-02 NOTE — Patient Instructions (Addendum)
Treatment Plan Take doxycycline 20 mg tablet by mouth twice a day with food.   Doxycycline should be taken with food to prevent nausea. Do not lay down for 30 minutes after taking. Be cautious with sun exposure and use good sun protection while on this medication. Pregnant women should not take this medication.   Apply metronidazole 0.75% cream once to twice a day to affected areas at face.  Cryotherapy Aftercare  Wash gently with soap and water everyday.   Apply Vaseline and Band-Aid daily until healed.    Seborrheic Keratosis  What causes seborrheic keratoses? Seborrheic keratoses are harmless, common skin growths that first appear during adult life.  As time goes by, more growths appear.  Some people may develop a large number of them.  Seborrheic keratoses appear on both covered and uncovered body parts.  They are not caused by sunlight.  The tendency to develop seborrheic keratoses can be inherited.  They vary in color from skin-colored to gray, brown, or even black.  They can be either smooth or have a rough, warty surface.   Seborrheic keratoses are superficial and look as if they were stuck on the skin.  Under the microscope this type of keratosis looks like layers upon layers of skin.  That is why at times the top layer may seem to fall off, but the rest of the growth remains and re-grows.    Treatment Seborrheic keratoses do not need to be treated, but can easily be removed in the office.  Seborrheic keratoses often cause symptoms when they rub on clothing or jewelry.  Lesions can be in the way of shaving.  If they become inflamed, they can cause itching, soreness, or burning.  Removal of a seborrheic keratosis can be accomplished by freezing, burning, or surgery. If any spot bleeds, scabs, or grows rapidly, please return to have it checked, as these can be an indication of a skin cancer.  Due to recent changes in healthcare laws, you may see results of your pathology and/or  laboratory studies on MyChart before the doctors have had a chance to review them. We understand that in some cases there may be results that are confusing or concerning to you. Please understand that not all results are received at the same time and often the doctors may need to interpret multiple results in order to provide you with the best plan of care or course of treatment. Therefore, we ask that you please give [place] 2 business days to thoroughly review all your results before contacting the office for clarification. Should we see a critical lab result, you will be contacted sooner.   If You Need Anything After Your Visit  If you have any questions or concerns for your doctor, please call our main line at [phone] and press option 4 to reach your doctor's medical assistant. If no one answers, please leave a voicemail as directed and we will return your call as soon as possible. Messages left after 4 pm will be answered the following business day.   You may also send [place] a message via MyChart. We typically respond to MyChart messages within 1-2 business days.  For prescription refills, please ask your pharmacy to contact our office. Our fax number is [phone].  If you have an urgent issue when the clinic is closed that cannot wait until the next business day, you can page your doctor at the number below.    Please note that while we do our best to be  available for urgent issues outside of office hours, we are not available 24/7.   If you have an urgent issue and are unable to reach [place], you may choose to seek medical care at your doctor's office, retail clinic, urgent care center, or emergency room.  If you have a medical emergency, please immediately call 911 or go to the emergency department.  Pager Numbers  - Dr. Gwen Pounds: [phone]  - Dr. Neale Burly: [phone]  - Dr. Roseanne Reno: [phone]  In the event of inclement weather, please call our main line at [phone] for an  update on the status of any delays or closures.  Dermatology Medication Tips: Please keep the boxes that topical medications come in in order to help keep track of the instructions about where and how to use these. Pharmacies typically print the medication instructions only on the boxes and not directly on the medication tubes.   If your medication is too expensive, please contact our office at [phone] option 4 or send [place] a message through MyChart.   We are unable to tell what your co-pay for medications will be in advance as this is different depending on your insurance coverage. However, we may be able to find a substitute medication at lower cost or fill out paperwork to get insurance to cover a needed medication.   If a prior authorization is required to get your medication covered by your insurance company, please allow [place] 1-2 business days to complete this process.  Drug prices often vary depending on where the prescription is filled and some pharmacies may offer cheaper prices.  The website www.goodrx.com contains coupons for medications through different pharmacies. The prices here do not account for what the cost may be with help from insurance (it may be cheaper with your insurance), but the website can give you the price if you did not use any insurance.  - You can print the associated coupon and take it with your prescription to the pharmacy.  - You may also stop by our office during regular business hours and pick up a GoodRx coupon card.  - If you need your prescription sent electronically to a different pharmacy, notify our office through [facility] or by phone at [phone] option 4.

## 2022-08-02 NOTE — Progress Notes (Signed)
Follow-Up Visit   Subjective  Terri Melendez is a 72 y.o. female who presents for the following: a red spot at nose, present for about 2 months, comes and goes. Also a spot at right leg that she just noticed a few days ago, no symptoms and one at back that itches. Hx BCC 2002.   The following portions of the chart were reviewed this encounter and updated as appropriate: medications, allergies, medical history  Review of Systems:  No other skin or systemic complaints except as noted in HPI or Assessment and Plan.  Objective  Well appearing patient in no apparent distress; mood and affect are within normal limits.   A focused examination was performed of the following areas: Face, right leg, back  Relevant exam findings are noted in the Assessment and Plan.  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 Condition is symptomatic/ bothersome to patient. Not currently at goal.   Rosacea is a chronic progressive skin condition usually affecting the face of adults, causing redness and/or acne bumps. It is treatable but not curable. It sometimes affects the eyes (ocular rosacea) as well. It may respond to topical and/or systemic medication and can flare with stress, sun exposure, alcohol, exercise, topical steroids (including hydrocortisone/cortisone 10) and some foods.  Daily application of broad spectrum spf 30+ sunscreen to face is recommended to reduce flares.   Treatment Plan Take doxycycline 20 mg tablet by mouth twice a day with food.   Doxycycline should be taken with food to prevent nausea. Do not lay down for 30 minutes after taking. Be cautious with sun exposure and use good sun protection while on this medication. Pregnant women should not take this medication.   Apply metronidazole  0.75% cream once to twice a day to affected areas at face.  Recheck spot on nose on f/up.  If not improved consider biopsy.   SEBORRHEIC KERATOSIS - Stuck-on, waxy, tan-brown papules and/or plaques  - Benign-appearing - Discussed benign etiology and prognosis. - Observe - Call for any changes - right popliteal, back  Inflamed seborrheic keratosis (3) L spinal mid back x 3  Symptomatic, irritating, patient would like treated.  Benign-appearing.  Call clinic for new or changing lesions.   Prior to procedure, discussed risks of blister formation, small wound, skin dyspigmentation, or rare scar following treatment. Recommend Vaseline ointment to treated areas while healing.  If not improving with LN2, possible notalgia paresthetica.   Destruction of lesion - L spinal mid back x 3  Destruction method: cryotherapy   Informed consent: discussed and consent obtained   Lesion destroyed using liquid nitrogen: Yes   Region frozen until ice ball extended beyond lesion: Yes   Outcome: patient tolerated procedure well with no complications   Post-procedure details: wound care instructions given   Additional details:  Prior to procedure, discussed risks of blister formation, small wound, skin dyspigmentation, or rare scar following cryotherapy. Recommend Vaseline ointment to treated areas while healing.     Return for TBSE, as scheduled, Rosacea, Hx BCC, 4-10 week follow up with Dr. Kathie Rhodes or Dr. Kirtland Bouchard to recheck nose.  Anise Salvo, RMA, am acting as scribe for Willeen Niece, MD .   Documentation: I have reviewed the above documentation for accuracy and completeness, and I  agree with the above.  Willeen Niece, MD

## 2022-08-13 ENCOUNTER — Encounter: Payer: Self-pay | Admitting: Podiatry

## 2022-08-13 ENCOUNTER — Ambulatory Visit: Payer: Medicare HMO | Admitting: Podiatry

## 2022-08-13 DIAGNOSIS — M722 Plantar fascial fibromatosis: Secondary | ICD-10-CM

## 2022-08-13 NOTE — Progress Notes (Signed)
She presents today for follow-up of her Planter fasciitis states is about 90% better she continues her meloxicam and her shoe gear change.  Objective: Vital signs stable alert oriented x 3 she has no reproducible pain on palpation medial calcaneal tubercle of the left heel.  Assessment: Well-healing Planter fasciitis left.  Plan: Discussed etiology pathology conservative surgical therapies at this point I recommended that she continue the meloxicam all conservative therapies and should this start to regress she will notify us immediately.

## 2022-08-20 ENCOUNTER — Other Ambulatory Visit: Payer: Self-pay | Admitting: Internal Medicine

## 2022-09-07 ENCOUNTER — Other Ambulatory Visit: Payer: Self-pay | Admitting: Podiatry

## 2022-09-11 ENCOUNTER — Ambulatory Visit: Payer: Medicare HMO | Admitting: Podiatry

## 2022-09-11 DIAGNOSIS — M7752 Other enthesopathy of left foot: Secondary | ICD-10-CM | POA: Diagnosis not present

## 2022-09-11 NOTE — Progress Notes (Signed)
Subjective:  Patient ID: Terri Melendez, female    DOB: 11-22-1950,  MRN: [id number]  Chief Complaint  Patient presents with   Plantar Fasciitis    72 y.o. female presents with the above complaint.  Patient presents with new complaint of left ankle pain that has been on for quite some time.  Patient is of [place] fasciitis is healed and no further pain.  She states his ankle pain has been causing her some discomfort.  She has not tried any treatment for it denies seeing anyone as prior to seeing me.  Denies any other acute complaints   Review of Systems: Negative except as noted in the HPI. Denies N/V/F/Ch.  Past Medical History:  Diagnosis Date   Cancer (HCC)    basal cell on face in 2000 or 2001   Hx of basal cell carcinoma 2002   right cheek    Hypertension    Osteopenia    Sinus congestion     Current Outpatient Medications:    atorvastatin (LIPITOR) 20 MG tablet, TAKE ONE TABLET BY MOUTH DAILY, Disp: 90 tablet, Rfl: 3   cetirizine (ZYRTEC) 10 MG tablet, Take 1 tablet (10 mg total) by mouth daily., Disp: 90 tablet, Rfl: 1   Cholecalciferol 1.25 MG (50000 UT) capsule, Take 1 capsule (50,000 Units total) by mouth once a week., Disp: 4 capsule, Rfl: 0   hydrochlorothiazide (MICROZIDE) 12.5 MG capsule, Take 1 capsule (12.5 mg total) by mouth daily., Disp: 90 capsule, Rfl: 1   ketoconazole (NIZORAL) 2 % cream, Apply to the feet QHS., Disp: 60 g, Rfl: 1   meloxicam (MOBIC) 15 MG tablet, TAKE 1 TABLET BY MOUTH DAILY, Disp: 30 tablet, Rfl: 0   metroNIDAZOLE (METROCREAM) 0.75 % cream, Apply topically 1-2 times daily to nose for rosacea., Disp: 45 g, Rfl: 0   mometasone (ELOCON) 0.1 % lotion, Apply topically., Disp: , Rfl:    mometasone (NASONEX) 50 MCG/ACT nasal spray, Place 2 sprays into the nose daily., Disp: , Rfl:    telmisartan (MICARDIS) 40 MG tablet, Take 1 tablet (40 mg total) by mouth daily., Disp: 90 tablet, Rfl: 1   Vitamin D, Ergocalciferol, (DRISDOL) 1.25 MG (50000  UNIT) CAPS capsule, TAKE 1 CAPSULE BY MOUTH ONCE WEEKLY, Disp: 4 capsule, Rfl: 2  Social History   Tobacco Use  Smoking Status Never  Smokeless Tobacco Never    Allergies  Allergen Reactions   Dust Mite Extract    Objective:  There were no vitals filed for this visit. There is no height or weight on file to calculate BMI. Constitutional Well developed. Well nourished.  Vascular Dorsalis pedis pulses palpable bilaterally. Posterior tibial pulses palpable bilaterally. Capillary refill normal to all digits.  No cyanosis or clubbing noted. Pedal hair growth normal.  Neurologic Normal speech. Oriented to person, place, and time. Epicritic sensation to light touch grossly present bilaterally.  Dermatologic Nails well groomed and normal in appearance. No open wounds. No skin lesions.  Orthopedic: Pain on palpation to the left ankle joint medial gutter.  Pain with dorsiflexion of the ankle joint no pain with plantarflexion of the ankle joint.  No deep intra-articular pain noted.  Negative pain at the Achilles tendon peroneal tendon posterior tibial tendon   Radiographs: None Assessment:   1. Capsulitis of ankle, left    Plan:  Patient was evaluated and treated and all questions answered.  Left ankle capsulitis -All questions and concerns were discussed with the patient extensive detail given the amount of pain that  she is having she will benefit from a steroid injection help decrease inflammatory component associate with pain.  Patient agrees with plan we will proceed with steroid injection -A steroid injection was performed at left ankle using 1% plain Lidocaine and 10 mg of Kenalog. This was well tolerated.   No follow-ups on file.

## 2022-09-26 ENCOUNTER — Encounter (INDEPENDENT_AMBULATORY_CARE_PROVIDER_SITE_OTHER): Payer: Self-pay

## 2022-10-08 ENCOUNTER — Ambulatory Visit: Payer: Medicare HMO | Admitting: Podiatry

## 2022-10-10 ENCOUNTER — Ambulatory Visit: Payer: Medicare HMO | Admitting: Dermatology

## 2022-10-10 ENCOUNTER — Encounter: Payer: Self-pay | Admitting: Dermatology

## 2022-10-10 ENCOUNTER — Ambulatory Visit: Payer: Medicare HMO | Admitting: *Deleted

## 2022-10-10 VITALS — Ht 64.0 in | Wt 142.0 lb

## 2022-10-10 DIAGNOSIS — L578 Other skin changes due to chronic exposure to nonionizing radiation: Secondary | ICD-10-CM | POA: Diagnosis not present

## 2022-10-10 DIAGNOSIS — Z872 Personal history of diseases of the skin and subcutaneous tissue: Secondary | ICD-10-CM

## 2022-10-10 DIAGNOSIS — Z0000 Encounter for general adult medical examination without abnormal findings: Secondary | ICD-10-CM

## 2022-10-10 DIAGNOSIS — L719 Rosacea, unspecified: Secondary | ICD-10-CM

## 2022-10-10 DIAGNOSIS — W908XXA Exposure to other nonionizing radiation, initial encounter: Secondary | ICD-10-CM | POA: Diagnosis not present

## 2022-10-10 DIAGNOSIS — L821 Other seborrheic keratosis: Secondary | ICD-10-CM

## 2022-10-10 NOTE — Progress Notes (Addendum)
Subjective:   Terri Melendez is a 72 y.o. female who presents for Medicare Annual (Subsequent) preventive examination.  Visit Complete: Virtual  I connected with  Rainey Pines on 10/10/22 by a audio enabled telemedicine application and verified that I am speaking with the correct person using two identifiers.  Patient Location: Home  Provider Location: Office/Clinic  I discussed the limitations of evaluation and management by telemedicine. The patient expressed understanding and agreed to proceed.  Vital Signs: Unable to obtain new vitals due to this being a telehealth visit.   Review of Systems     Cardiac Risk Factors include: advanced age (>60men, >98 women);dyslipidemia;hypertension;Other (see comment), Risk factor comments: coronary atherosclerosis     Objective:    Today's Vitals   10/10/22 0815 10/10/22 0816  Weight: 142 lb (64.4 kg)   Height: 5\' 4"  (1.626 m)   PainSc:  4    Body mass index is 24.37 kg/m.     10/10/2022    8:35 AM 11/18/2021    9:14 AM 10/06/2021    8:49 AM 10/03/2020   10:37 AM 10/01/2019   10:48 AM 09/20/2019   11:13 PM 09/30/2018   10:43 AM  Advanced Directives  Does Patient Have a Medical Advance Directive? Yes Yes Yes Yes Yes Yes Yes  Type of Estate agent of [place];Living will Healthcare Power of [place];Living will Healthcare Power of [place];Living will Healthcare Power of [place];Living will Healthcare Power of [place];Living will  Healthcare Power of [place];Living will  Does patient want to make changes to medical advance directive? No - Patient declined  No - Patient declined No - Patient declined No - Patient declined  No - Patient declined  Copy of Healthcare Power of Attorney in Chart? Yes - validated most recent copy scanned in chart (See row information)  Yes - validated most recent copy scanned in chart (See row information) Yes - validated most recent copy scanned in chart (See row information)  Yes - validated most recent copy scanned in chart (See row information)  No - copy requested    Current Medications (verified) Outpatient Encounter Medications as of 10/10/2022  Medication Sig   atorvastatin (LIPITOR) 20 MG tablet TAKE ONE TABLET BY MOUTH DAILY   cetirizine (ZYRTEC) 10 MG tablet Take 1 tablet (10 mg total) by mouth daily.   cholecalciferol (VITAMIN D3) 25 MCG (1000 UNIT) tablet Take 1,000 Units by mouth daily.   hydrochlorothiazide (MICROZIDE) 12.5 MG capsule Take 1 capsule (12.5 mg total) by mouth daily.   meloxicam (MOBIC) 15 MG tablet TAKE 1 TABLET BY MOUTH DAILY   metroNIDAZOLE (METROCREAM) 0.75 % cream Apply topically 1-2 times daily to nose for rosacea.   mometasone (NASONEX) 50 MCG/ACT nasal spray Place 2 sprays into the nose daily.   telmisartan (MICARDIS) 40 MG tablet Take 1 tablet (40 mg total) by mouth daily.   Cholecalciferol 1.25 MG (50000 UT) capsule Take 1 capsule (50,000 Units total) by mouth once a week. (Patient not taking: Reported on 10/10/2022)   mometasone (ELOCON) 0.1 % lotion Apply topically. (Patient not taking: Reported on 10/10/2022)   Vitamin D, Ergocalciferol, (DRISDOL) 1.25 MG (50000 UNIT) CAPS capsule TAKE 1 CAPSULE BY MOUTH ONCE WEEKLY (Patient not taking: Reported on 10/10/2022)   [DISCONTINUED] ketoconazole (NIZORAL) 2 % cream Apply to the feet QHS.   No facility-administered encounter medications on file as of 10/10/2022.    Allergies (verified) Dust mite extract   History: Past Medical History:  Diagnosis Date  Cancer (HCC)    basal cell on face in 2000 or 2001   Hx of basal cell carcinoma 2002   right cheek    Hypertension    Osteopenia    Sinus congestion    Past Surgical History:  Procedure Laterality Date   BASAL CELL CARCINOMA EXCISION     TONSILLECTOMY AND ADENOIDECTOMY     Family History  Problem Relation Age of Onset   Cancer Mother        lung   Breast cancer Maternal Aunt    Social History   Socioeconomic  History   Marital status: Married    Spouse name: Not on file   Number of children: Not on file   Years of education: Not on file   Highest education level: Bachelor's degree (e.g., BA, AB, BS)  Occupational History   Not on file  Tobacco Use   Smoking status: Never   Smokeless tobacco: Never  Vaping Use   Vaping status: Never Used  Substance and Sexual Activity   Alcohol use: Yes    Comment: on occasion   Drug use: No   Sexual activity: Not on file  Other Topics Concern   Not on file  Social History Narrative   Married   Social Determinants of Health   Financial Resource Strain: Low Risk  (10/10/2022)   Overall Financial Resource Strain (CARDIA)    Difficulty of Paying Living Expenses: Not hard at all  Food Insecurity: No Food Insecurity (10/10/2022)   Hunger Vital Sign    Worried About Running Out of Food in the Last Year: Never true    Ran Out of Food in the Last Year: Never true  Transportation Needs: No Transportation Needs (10/10/2022)   PRAPARE - Administrator, Civil Service (Medical): No    Lack of Transportation (Non-Medical): No  Physical Activity: Insufficiently Active (10/10/2022)   Exercise Vital Sign    Days of Exercise per Week: 4 days    Minutes of Exercise per Session: 30 min  Stress: No Stress Concern Present (10/10/2022)   [employer] of Occupational Health - Occupational Stress Questionnaire    Feeling of Stress : Not at all  Social Connections: Socially Integrated (10/10/2022)   Social Connection and Isolation Panel [NHANES]    Frequency of Communication with Friends and Family: More than three times a week    Frequency of Social Gatherings with Friends and Family: Twice a week    Attends Religious Services: More than 4 times per year    Active Member of [employer] or Organizations: Yes    Attends Engineer, structural: More than 4 times per year    Marital Status: Married    Tobacco Counseling Counseling given: Not  Answered   Clinical Intake:  Pre-visit preparation completed: Yes  Pain : 0-10 Pain Score: 4  Pain Type: Chronic pain Pain Location: Foot Pain Orientation: Left (some in right) Pain Descriptors / Indicators: Nagging Pain Onset: More than a month ago Pain Frequency: Intermittent Effect of Pain on Daily Activities: R. Azaleah Usman LPN     BMI - recorded: 24.37 Nutritional Status: BMI of 19-24  Normal Nutritional Risks: None Diabetes: No  How often do you need to have someone help you when you read instructions, pamphlets, or other written materials from your doctor or pharmacy?: 1 - Never  Interpreter Needed?: No  Information entered by :: R. Karlissa Aron LPN   Activities of Daily Living    10/10/2022    8:21  AM  In your present state of health, do you have any difficulty performing the following activities:  Hearing? 1  Comment some hearing loss  Vision? 0  Comment glasses  Difficulty concentrating or making decisions? 0  Walking or climbing stairs? 0  Dressing or bathing? 0  Doing errands, shopping? 0  Preparing Food and eating ? N  Using the Toilet? N  In the past six months, have you accidently leaked urine? N  Do you have problems with loss of bowel control? N  Managing your Medications? N  Managing your Finances? N  Housekeeping or managing your Housekeeping? N    Patient Care Team: Sherlene Shams, MD as PCP - General (Internal Medicine)  Indicate any recent Medical Services you may have received from other than [facility] providers in the past year (date may be approximate).     Assessment:   This is a routine wellness examination for [place].  Hearing/Vision screen Hearing Screening - Comments:: Some hearing loss Vision Screening - Comments:: glasses  Dietary issues and exercise activities discussed:     Goals Addressed             This Visit's Progress    Patient Stated       Wants to walk more if foot issue gets cleared up       Depression Screen     10/10/2022    8:30 AM 07/31/2022   11:33 AM 07/19/2022   10:07 AM 01/10/2022    9:44 AM 10/06/2021    8:38 AM 06/16/2021    9:11 AM 12/16/2020    9:59 AM  PHQ 2/9 Scores  PHQ - 2 Score 0 0 0 0 0 0 0  PHQ- 9 Score 2 2 0        Fall Risk    10/10/2022    8:24 AM 07/31/2022   11:32 AM 07/19/2022   10:07 AM 01/10/2022    9:44 AM 10/06/2021    8:38 AM  Fall Risk   Falls in the past year? 0 0 0 0 0  Number falls in past yr: 0 0 0    Injury with Fall? 0 0 0    Risk for fall due to : No Fall Risks No Fall Risks No Fall Risks No Fall Risks   Follow up Falls prevention discussed;Falls evaluation completed Falls evaluation completed  Falls evaluation completed Falls evaluation completed    MEDICARE RISK AT HOME:  Medicare Risk at Home - 10/10/22 0824     Any stairs in or around the home? Yes    If so, are there any without handrails? No    Home free of loose throw rugs in walkways, pet beds, electrical cords, etc? Yes    Adequate lighting in your home to reduce risk of falls? Yes    Life alert? No    Use of a cane, walker or w/c? No    Grab bars in the bathroom? No    Shower chair or bench in shower? Yes    Elevated toilet seat or a handicapped toilet? No                 Cognitive Function:    10/01/2019   10:55 AM  MMSE - Mini Mental State Exam  Not completed: Unable to complete        10/10/2022    8:35 AM 09/30/2018   10:56 AM  6CIT Screen  What Year? 0 points 0 points  What month? 0 points  0 points  What time? 0 points 0 points  Count back from 20 0 points 0 points  Months in reverse 0 points 0 points  Repeat phrase 0 points 0 points  Total Score 0 points 0 points    Immunizations Immunization History  Administered Date(s) Administered   COVID-19, mRNA, vaccine(Comirnaty)12 years and older 06/12/2022   Fluad Quad(high Dose 65+) 11/22/2018, 11/15/2020   Influenza Split 01/13/2016   Influenza-Unspecified 12/13/2016, 12/31/2017, 12/18/2021   PFIZER(Purple  Top)SARS-COV-2 Vaccination 04/10/2019, 05/05/2019, 11/27/2019   PNEUMOCOCCAL CONJUGATE-20 06/15/2020   Pfizer Covid-19 Vaccine Bivalent Booster 25yrs & up 11/21/2020   Pneumococcal Conjugate-13 12/11/2018   Td 06/25/2005   Tdap 10/17/2015   Zoster Recombinant(Shingrix) 12/22/2018, 03/09/2019   Zoster, Live 09/06/2010    TDAP status: Up to date  Flu Vaccine status: Up to date  Pneumococcal vaccine status: Up to date  Covid-19 vaccine status: Completed vaccines  Qualifies for Shingles Vaccine? Yes   Zostavax completed Yes   Shingrix Completed?: Yes  Screening Tests Health Maintenance  Topic Date Due   COVID-19 Vaccine (6 - 2023-24 season) 08/07/2022   Medicare Annual Wellness (AWV)  10/07/2022   INFLUENZA VACCINE  09/27/2022   MAMMOGRAM  03/22/2023   DTaP/Tdap/Td (3 - Td or Tdap) 10/16/2025   Colonoscopy  10/08/2028   Pneumonia Vaccine 15+ Years old  Completed   DEXA SCAN  Completed   Hepatitis C Screening  Completed   Zoster Vaccines- Shingrix  Completed   HPV VACCINES  Aged Out    Health Maintenance  Health Maintenance Due  Topic Date Due   COVID-19 Vaccine (6 - 2023-24 season) 08/07/2022   Medicare Annual Wellness (AWV)  10/07/2022   INFLUENZA VACCINE  09/27/2022    Colorectal cancer screening: Type of screening: Colonoscopy. Completed 8/20. Repeat every 10 years  Mammogram status: Completed 1/24. Repeat every year  Bone Density status: Completed 1/22. Results reflect: Bone density results: OSTEOPENIA. Repeat every 2 years. Wants to discuss with PCP at next visit  Lung Cancer Screening: (Low Dose CT Chest recommended if Age 70-80 years, 20 pack-year currently smoking OR have quit w/in 15years.) does not qualify.    Additional Screening:  Hepatitis C Screening: does qualify; Completed 8/17  Vision Screening: Recommended annual ophthalmology exams for early detection of glaucoma and other disorders of the eye. Is the patient up to date with their annual eye  exam?  Yes  Who is the provider or what is the name of the office in which the patient attends annual eye exams? Dr. Larence Penning If pt is not established with a provider, would they like to be referred to a provider to establish care? No .   Dental Screening: Recommended annual dental exams for proper oral hygiene   Community Resource Referral / Chronic Care Management: CRR required this visit?  No   CCM required this visit?  No     Plan:     I have personally reviewed and noted the following in the patient's chart:   Medical and social history Use of alcohol, tobacco or illicit drugs  Current medications and supplements including opioid prescriptions. Patient is not currently taking opioid prescriptions. Functional ability and status Nutritional status Physical activity Advanced directives List of other physicians Hospitalizations, surgeries, and ER visits in previous 12 months Vitals Screenings to include cognitive, depression, and falls Referrals and appointments  In addition, I have reviewed and discussed with patient certain preventive protocols, quality metrics, and best practice recommendations. A written personalized care plan for  preventive services as well as general preventive health recommendations were provided to patient.     Sydell Axon, LPN   [id number]   After Visit Summary: (MyChart) Due to this being a telephonic visit, the after visit summary with patients personalized plan was offered to patient via MyChart   Nurse Notes: None     I have reviewed the above information and agree with above.   Duncan Dull, MD

## 2022-10-10 NOTE — Patient Instructions (Signed)

## 2022-10-10 NOTE — Patient Instructions (Signed)
Terri Melendez , Thank you for taking time to come for your Medicare Wellness Visit. I appreciate your ongoing commitment to your health goals. Please review the following plan we discussed and let me know if I can assist you in the future.   Referrals/Orders/Follow-Ups/Clinician Recommendations: None  This is a list of the screening recommended for you and due dates:  Health Maintenance  Topic Date Due   COVID-19 Vaccine (6 - 2023-24 season) 08/07/2022   Flu Shot  09/27/2022   Mammogram  03/22/2023   Medicare Annual Wellness Visit  10/10/2023   DTaP/Tdap/Td vaccine (3 - Td or Tdap) 10/16/2025   Colon Cancer Screening  10/08/2028   Pneumonia Vaccine  Completed   DEXA scan (bone density measurement)  Completed   Hepatitis C Screening  Completed   Zoster (Shingles) Vaccine  Completed   HPV Vaccine  Aged Out    Advanced directives: (In Chart) A copy of your advanced directives are scanned into your chart should your provider ever need it.  Next Medicare Annual Wellness Visit scheduled for next year: Yes 10/14/23 @ 8:15  Preventive Care 65 Years and Older, Female Preventive care refers to lifestyle choices and visits with your health care provider that can promote health and wellness. What does preventive care include? A yearly physical exam. This is also called an annual well check. Dental exams once or twice a year. Routine eye exams. Ask your health care provider how often you should have your eyes checked. Personal lifestyle choices, including: Daily care of your teeth and gums. Regular physical activity. Eating a healthy diet. Avoiding tobacco and drug use. Limiting alcohol use. Practicing safe sex. Taking low-dose aspirin every day. Taking vitamin and mineral supplements as recommended by your health care provider. What happens during an annual well check? The services and screenings done by your health care provider during your annual well check will depend on your age,  overall health, lifestyle risk factors, and family history of disease. Counseling  Your health care provider may ask you questions about your: Alcohol use. Tobacco use. Drug use. Emotional well-being. Home and relationship well-being. Sexual activity. Eating habits. History of falls. Memory and ability to understand (cognition). Work and work Astronomer. Reproductive health. Screening  You may have the following tests or measurements: Height, weight, and BMI. Blood pressure. Lipid and cholesterol levels. These may be checked every 5 years, or more frequently if you are over 34 years old. Skin check. Lung cancer screening. You may have this screening every year starting at age 43 if you have a 30-pack-year history of smoking and currently smoke or have quit within the past 15 years. Fecal occult blood test (FOBT) of the stool. You may have this test every year starting at age 82. Flexible sigmoidoscopy or colonoscopy. You may have a sigmoidoscopy every 5 years or a colonoscopy every 10 years starting at age 52. Hepatitis C blood test. Hepatitis B blood test. Sexually transmitted disease (STD) testing. Diabetes screening. This is done by checking your blood sugar (glucose) after you have not eaten for a while (fasting). You may have this done every 1-3 years. Bone density scan. This is done to screen for osteoporosis. You may have this done starting at age 9. Mammogram. This may be done every 1-2 years. Talk to your health care provider about how often you should have regular mammograms. Talk with your health care provider about your test results, treatment options, and if necessary, the need for more tests. Vaccines  Your  health care provider may recommend certain vaccines, such as: Influenza vaccine. This is recommended every year. Tetanus, diphtheria, and acellular pertussis (Tdap, Td) vaccine. You may need a Td booster every 10 years. Zoster vaccine. You may need this after age  66. Pneumococcal 13-valent conjugate (PCV13) vaccine. One dose is recommended after age 62. Pneumococcal polysaccharide (PPSV23) vaccine. One dose is recommended after age 17. Talk to your health care provider about which screenings and vaccines you need and how often you need them. This information is not intended to replace advice given to you by your health care provider. Make sure you discuss any questions you have with your health care provider. Document Released: 03/11/2015 Document Revised: 11/02/2015 Document Reviewed: 12/14/2014 Elsevier Interactive Patient Education  2017 [employer].  Fall Prevention in the Home Falls can cause injuries. They can happen to people of all ages. There are many things you can do to make your home safe and to help prevent falls. What can I do on the outside of my home? Regularly fix the edges of walkways and driveways and fix any cracks. Remove anything that might make you trip as you walk through a door, such as a raised step or threshold. Trim any bushes or trees on the path to your home. Use bright outdoor lighting. Clear any walking paths of anything that might make someone trip, such as rocks or tools. Regularly check to see if handrails are loose or broken. Make sure that both sides of any steps have handrails. Any raised decks and porches should have guardrails on the edges. Have any leaves, snow, or ice cleared regularly. Use sand or salt on walking paths during winter. Clean up any spills in your garage right away. This includes oil or grease spills. What can I do in the bathroom? Use night lights. Install grab bars by the toilet and in the tub and shower. Do not use towel bars as grab bars. Use non-skid mats or decals in the tub or shower. If you need to sit down in the shower, use a plastic, non-slip stool. Keep the floor dry. Clean up any water that spills on the floor as soon as it happens. Remove soap buildup in the tub or shower  regularly. Attach bath mats securely with double-sided non-slip rug tape. Do not have throw rugs and other things on the floor that can make you trip. What can I do in the bedroom? Use night lights. Make sure that you have a light by your bed that is easy to reach. Do not use any sheets or blankets that are too big for your bed. They should not hang down onto the floor. Have a firm chair that has side arms. You can use this for support while you get dressed. Do not have throw rugs and other things on the floor that can make you trip. What can I do in the kitchen? Clean up any spills right away. Avoid walking on wet floors. Keep items that you use a lot in easy-to-reach places. If you need to reach something above you, use a strong step stool that has a grab bar. Keep electrical cords out of the way. Do not use floor polish or wax that makes floors slippery. If you must use wax, use non-skid floor wax. Do not have throw rugs and other things on the floor that can make you trip. What can I do with my stairs? Do not leave any items on the stairs. Make sure that there are handrails  on both sides of the stairs and use them. Fix handrails that are broken or loose. Make sure that handrails are as long as the stairways. Check any carpeting to make sure that it is firmly attached to the stairs. Fix any carpet that is loose or worn. Avoid having throw rugs at the top or bottom of the stairs. If you do have throw rugs, attach them to the floor with carpet tape. Make sure that you have a light switch at the top of the stairs and the bottom of the stairs. If you do not have them, ask someone to add them for you. What else can I do to help prevent falls? Wear shoes that: Do not have high heels. Have rubber bottoms. Are comfortable and fit you well. Are closed at the toe. Do not wear sandals. If you use a stepladder: Make sure that it is fully opened. Do not climb a closed stepladder. Make sure that  both sides of the stepladder are locked into place. Ask someone to hold it for you, if possible. Clearly mark and make sure that you can see: Any grab bars or handrails. First and last steps. Where the edge of each step is. Use tools that help you move around (mobility aids) if they are needed. These include: Canes. Walkers. Scooters. Crutches. Turn on the lights when you go into a dark area. Replace any light bulbs as soon as they burn out. Set up your furniture so you have a clear path. Avoid moving your furniture around. If any of your floors are uneven, fix them. If there are any pets around you, be aware of where they are. Review your medicines with your doctor. Some medicines can make you feel dizzy. This can increase your chance of falling. Ask your doctor what other things that you can do to help prevent falls. This information is not intended to replace advice given to you by your health care provider. Make sure you discuss any questions you have with your health care provider. Document Released: 12/09/2008 Document Revised: 07/21/2015 Document Reviewed: 03/19/2014 Elsevier Interactive Patient Education  2017 [employer].

## 2022-10-10 NOTE — Progress Notes (Signed)
   Follow-Up Visit   Subjective  Terri Melendez is a 72 y.o. female who presents for the following: Recheck rosacea papule of right nose - seems resolved. ISK follow up of back treated with LN2 x 3  The patient has spots, moles and lesions to be evaluated, some may be new or changing and the patient may have concern these could be cancer.   The following portions of the chart were reviewed this encounter and updated as appropriate: medications, allergies, medical history  Review of Systems:  No other skin or systemic complaints except as noted in HPI or Assessment and Plan.  Objective  Well appearing patient in no apparent distress; mood and affect are within normal limits.   A focused examination was performed of the following areas: Face, back  Relevant exam findings are noted in the Assessment and Plan.    Assessment & Plan   History of Rosacea papule of right nose Exam: Slight pinkness of right nose  Treatment Plan: Resolved  SEBORRHEIC KERATOSIS - Stuck-on, waxy, tan-brown papules and/or plaques  - Benign-appearing - Discussed benign etiology and prognosis. - Observe - Call for any changes  ACTINIC DAMAGE - chronic, secondary to cumulative UV radiation exposure/sun exposure over time - diffuse scaly erythematous macules with underlying dyspigmentation - Recommend daily broad spectrum sunscreen SPF 30+ to sun-exposed areas, reapply every 2 hours as needed.  - Recommend staying in the shade or wearing long sleeves, sun glasses (UVA+UVB protection) and wide brim hats (4-inch brim around the entire circumference of the hat). - Call for new or changing lesions.  Return for Follow up as scheduled.  I, Joanie Coddington, CMA, am acting as scribe for Armida Sans, MD .   Documentation: I have reviewed the above documentation for accuracy and completeness, and I agree with the above.  Armida Sans, MD

## 2022-10-21 ENCOUNTER — Encounter: Payer: Self-pay | Admitting: Dermatology

## 2022-10-24 ENCOUNTER — Ambulatory Visit: Payer: Medicare HMO | Admitting: Podiatry

## 2022-10-24 ENCOUNTER — Encounter: Payer: Self-pay | Admitting: Podiatry

## 2022-10-24 DIAGNOSIS — M7752 Other enthesopathy of left foot: Secondary | ICD-10-CM

## 2022-10-24 DIAGNOSIS — M722 Plantar fascial fibromatosis: Secondary | ICD-10-CM | POA: Diagnosis not present

## 2022-10-24 MED ORDER — MELOXICAM 15 MG PO TABS: 15.0000 mg | ORAL_TABLET | Freq: Every day | ORAL | 3 refills | Status: DC

## 2022-10-24 MED ORDER — TRIAMCINOLONE ACETONIDE 40 MG/ML IJ SUSP
20.0000 mg | Freq: Once | INTRAMUSCULAR | Status: AC
Start: 2022-10-24 — End: 2022-10-24
  Administered 2022-10-24: 20 mg

## 2022-10-25 NOTE — Progress Notes (Signed)
She presents today states that it hurts and here she points to the posterior Achilles area and the medial and lateral aspects of the Achilles and as it runs up into the back of her leg.  She states that he was doing pretty good and just started hurting again and she needs a refill on her meloxicam as well.  Objective: Vital signs are stable she is alert and oriented x 3.  There is no erythema edema cellulitis drainage or odor.  She has tenderness on palpation of the posterior tibial tendon and of the peroneal tendons and of the Achilles tendon.  I palpated the medial calcaneal tubercle which was exquisitely tender for her.  Most likely this plantar fasciitis is resulting in some compensation which is overutilizing her other tendons causing more of a complex issue.  Assessment: Planter fasciitis with compensatory syndrome left.  Plan: I refilled her meloxicam and reinjected the plantar fascia.  If this is not improved by next visit we will consider MRI.

## 2022-11-14 ENCOUNTER — Other Ambulatory Visit: Payer: Self-pay | Admitting: Internal Medicine

## 2022-11-22 ENCOUNTER — Encounter: Payer: Self-pay | Admitting: Podiatry

## 2022-11-22 ENCOUNTER — Ambulatory Visit: Payer: Medicare HMO | Admitting: Podiatry

## 2022-11-22 DIAGNOSIS — M722 Plantar fascial fibromatosis: Secondary | ICD-10-CM | POA: Diagnosis not present

## 2022-11-25 NOTE — Progress Notes (Signed)
She presents today states that her plantar fasciitis with a compensatory syndrome gets better some days and worse the next days.  Objective: Vital signs are stable alert oriented x 3 she has tenderness along the peroneal tendons posterior tibial tendons plantar fascia bilateral.  Mostly compensatory I feel.  Assessment: Plantar fasciitis chronic in nature compensatory changes as well.  Plan: I will get her to see Nicki Guadalajara next week at the [place] office for orthotics and then we will start her with physical therapy in [place].

## 2022-11-28 ENCOUNTER — Ambulatory Visit: Payer: Medicare HMO | Attending: Podiatry

## 2022-11-28 DIAGNOSIS — R262 Difficulty in walking, not elsewhere classified: Secondary | ICD-10-CM | POA: Diagnosis present

## 2022-11-28 DIAGNOSIS — M25572 Pain in left ankle and joints of left foot: Secondary | ICD-10-CM | POA: Insufficient documentation

## 2022-11-28 DIAGNOSIS — M722 Plantar fascial fibromatosis: Secondary | ICD-10-CM | POA: Diagnosis not present

## 2022-11-28 DIAGNOSIS — M79672 Pain in left foot: Secondary | ICD-10-CM | POA: Diagnosis present

## 2022-11-28 NOTE — Therapy (Signed)
OUTPATIENT PHYSICAL THERAPY EVALUATION   Patient Name: Terri Melendez MRN: [id number] DOB:Dec 31, 1950, 72 y.o., female Today's Date: 11/28/2022  END OF SESSION:  PT End of Session - 11/28/22 1036     Visit Number 1    Number of Visits 17    Date for PT Re-Evaluation 01/25/23    PT Start Time 1036    PT Stop Time 1118    PT Time Calculation (min) 42 min    Activity Tolerance Patient tolerated treatment well    Behavior During Therapy [facility] for tasks assessed/performed             Past Medical History:  Diagnosis Date   Cancer (HCC)    basal cell on face in 2000 or 2001   Hx of basal cell carcinoma 2002   right cheek    Hypertension    Osteopenia    Sinus congestion    Past Surgical History:  Procedure Laterality Date   BASAL CELL CARCINOMA EXCISION     TONSILLECTOMY AND ADENOIDECTOMY     Patient Active Problem List   Diagnosis Date Noted   Plantar fasciitis of left foot 03/28/2022   Anemia 01/10/2022   Allergic rhinitis 06/17/2020   GERD with esophagitis 06/17/2020   Personal history of other malignant neoplasm of skin 06/03/2020   Polyarthralgia 12/15/2019   Thoracic aortic atherosclerosis (HCC) 12/15/2019   Coronary atherosclerosis due to calcified coronary lesion (CODE) 12/15/2019   Encounter for preventive health examination 12/13/2018   History of shingles 05/04/2018   Non-functional thyroid nodule 05/04/2018   Essential hypertension 10/22/2017   Advanced care planning/counseling discussion 10/22/2017   Hypercholesteremia 05/14/2016   OSA on CPAP 10/13/2014    PCP:   Sherlene Shams, MD    REFERRING PROVIDER: Elinor Parkinson, DPM  REFERRING DIAG: M72.2 (ICD-10-CM) - Plantar fasciitis of left foot  Rationale for Evaluation and Treatment: Rehabilitation  THERAPY DIAG:  Pain in left foot - Plan: PT plan of care cert/re-cert  Pain in left ankle and joints of left foot - Plan: PT plan of care cert/re-cert  Difficulty in walking, not elsewhere  classified - Plan: PT plan of care cert/re-cert  ONSET DATE: end of January 2024  SUBJECTIVE:                                                                                                                                                                                           SUBJECTIVE STATEMENT: L foot pain: L heel and posterior tibialis tendon and plantar fascia (at times), and L medial and lateral malleoli posteriorly.   L foot pain, 0/10 currently but feels like  something is not quite normal at her plantar foot. 8/10 at worst for the past 3 months  PERTINENT HISTORY:  L foot plantar fasciitis. Pain began at the end of January 2024, sudden onset. Pt was driving to [facility] for 5 hours. Wore clogs. The foot at an angle (IV) for 5 hours as well as frequently got up and down from a high truck during the trip might have caused her pain, since her L foot pain began a few days after her trip. Pain worsened initially. Had x-ray which did not reveal any fractures. Was diagnosed with plantar fasciitis. Was given exercises such as seated (on the floor) ankle DF stretch with towel, as well as stretching her plantar fascia with foot on a step, as well as rolling her foot over a frozen water bottle which did not really make her foot much better. Had swelling and made an appontment with Dr. Al Corpus since February. Has had cortisone shots. Has hints of R foot plantar fasciitis as well. L foot pain gets better, then worse, then better, then worse. Wears 8.5 size shoes and got size 9 shoes with inserts. Currently has an appointment to get orthotics professionally made.    No latex allergies HTN is controlled with medicine per pt.   PAIN:  Are you having pain? Yes: NPRS scale: 0/10 Pain location: L heel and posterior tibialis tendon and plantar fascia (at times), and L medial and lateral malleoli posteriorly.  Pain description: half size larger shoe with arch supports Aggravating factors: wearing dress  shoes, walking on high heels, possibly prolonged walking  Relieving factors: larger shoe size with arch support  PRECAUTIONS: Other: Osteopenia  RED FLAGS: Bowel or bladder incontinence: No and Cauda equina syndrome: No   WEIGHT BEARING RESTRICTIONS: No  FALLS:  Has patient fallen in last 6 months? No  LIVING ENVIRONMENT: Lives with: lives with their spouse Lives in: House/apartment Stairs: Yes: Internal: 12 steps; on right going up Has following equipment at home: None  OCCUPATION: retired  PLOF: Independent  PATIENT GOALS: Be able to wear dress shoes, not necessarily heels, but something that is not a tennis shoe.   NEXT MD VISIT: None scheduled yet  OBJECTIVE:  Note: Objective measures were completed at Evaluation unless otherwise noted.  DIAGNOSTIC FINDINGS:    PATIENT SURVEYS:  FOTO L foot FOTO 56 (11/28/2022)  SCREENING FOR RED FLAGS: Bowel or bladder incontinence: No Cauda equina syndrome: No   COGNITION: Overall cognitive status: Within functional limits for tasks assessed     SENSATION:   MUSCLE LENGTH:   POSTURE:  Slight pronation B feet.   PALPATION: TTP L planter foot and first and 2nd digit toe flexor muscles Decrease fascial mobility L heel   LUMBAR ROM:   AROM eval  Flexion   Extension   Right lateral flexion   Left lateral flexion   Right rotation   Left rotation    (Blank rows = not tested)  LOWER EXTREMITY ROM:     Active  Right eval Left eval  Hip flexion    Hip extension    Hip abduction    Hip adduction    Hip internal rotation    Hip external rotation    Knee flexion    Knee extension    Ankle dorsiflexion    Ankle plantarflexion    Ankle inversion    Ankle eversion     (Blank rows = not tested)  LOWER EXTREMITY MMT:    MMT Right eval Left  eval  Hip flexion 4- 4-  Hip extension 4 4  Hip abduction 4 4  Hip adduction    Hip internal rotation    Hip external rotation 4 4  Knee flexion 5 5  Knee  extension 5 5  Ankle dorsiflexion (manually resisted) 4 4  Ankle plantarflexion (manually resisted) 4+ 4+  Ankle inversion (manually resisted) 4 4-  Ankle eversion (manually resisted) 4+ 4   (Blank rows = not tested)  LUMBAR SPECIAL TESTS:  (-) Slump L LE  FUNCTIONAL TESTS:    GAIT: Distance walked: 50 ft Assistive device utilized: None Level of assistance: Complete Independence Comments: antalgic, decreased stance L LE Ascending and descending 4 regular step with R UE assist   R and L genu valgus descending stairs .  TODAY'S TREATMENT:                                                                                                                              DATE: 11/28/2022    Eval only  PATIENT EDUCATION:  Education details: POC Person educated: Patient Education method: Explanation Education comprehension: verbalized understanding  HOME EXERCISE PROGRAM:   ASSESSMENT:  CLINICAL IMPRESSION: Patient is a 72 y.o. female who was seen today for physical therapy evaluation and treatment for L plantar fasciitis. She also presents with altered gait pattern and posture, decreased L hip extension and abduction strength, TTP L plantar foot, decreased fascial mobility L heel,  and difficulty with ambulation for prolonged periods as well as gait with dress shoes due to pain. Pt will benefit from skilled physical therapy services to address the aforementioned deficits.      OBJECTIVE IMPAIRMENTS: Abnormal gait, difficulty walking, improper body mechanics, postural dysfunction, and pain.   ACTIVITY LIMITATIONS: locomotion level  PARTICIPATION LIMITATIONS:   PERSONAL FACTORS: Age, Fitness, Time since onset of injury/illness/exacerbation, and 3+ comorbidities: cancer, HTN, osteopenia  are also affecting patient's functional outcome.   REHAB POTENTIAL: Fair    CLINICAL DECISION MAKING: Stable/uncomplicated  EVALUATION COMPLEXITY: Low   GOALS: Goals reviewed with patient?  Yes  SHORT TERM GOALS: Target date: 12/21/2022   Pt will be independent with her initial HEP to decrease pain, improve strength, function, and ability to ambulate as well as wear dress shoes more comfortably for her foot.  Baseline Pt has not yet started her HEP (11/28/2022) Goal status: INITIAL     LONG TERM GOALS: Target date: 01/25/2023  Pt will have a decrease in L foot pain to 3/10 or less at worst to promote ability to ambulate more comfortably for her foot.  Baseline: 8/10 at worst for the past 3 months (11/28/2022) Goal status: INITIAL  2.  Pt will improve her FOTO score by at least 10 points as a demonstration of improved function.  Baseline: L foot FOTO 56 (11/28/2022) Goal status: INITIAL  3.  Pt will improve her L hip extension and abduction strength to promote femoral control during standing tasks as  well as with stair negotiation to decrease plantar pressure to her L foot.  Baseline:  MMT Right eval Left eval  Hip extension 4 4  Hip abduction 4 4  (11/28/2022) Goal status: INITIAL  PLAN:  PT FREQUENCY: 2x/week  PT DURATION: 8 weeks  PLANNED INTERVENTIONS: Therapeutic exercises, Therapeutic activity, Neuromuscular re-education, Balance training, Gait training, Patient/Family education, Joint mobilization, Stair training, Aquatic Therapy, Dry Needling, Electrical stimulation, Ionotophoresis 4mg /ml Dexamethasone, Manual therapy, and Re-evaluation.  PLAN FOR NEXT SESSION: posture, glute med, max strengthening, femoral control, isometric, concentric, eccentric loading to Achilles, manual techniques, modalities PRN   Alaisha Eversley, PT, DPT 11/28/2022, 4:58 PM

## 2022-11-30 ENCOUNTER — Ambulatory Visit: Payer: Medicare HMO

## 2022-11-30 NOTE — Progress Notes (Signed)
Patient was present and evaluated for Custom molded foot orthotics. Patient will benefit from CFO's to provide total contact to BIL MLA's helping to balance and distribute body weight more evenly across BIL feet helping to reduce plantar pressure and pain. Orthotic will also encourage FF / RF alignment  Patient was scanned today and will return for fitting upon receipt  Addison Bailey Cped, CFo, CFm

## 2022-12-04 ENCOUNTER — Ambulatory Visit: Payer: Medicare HMO

## 2022-12-04 DIAGNOSIS — M79672 Pain in left foot: Secondary | ICD-10-CM | POA: Diagnosis not present

## 2022-12-04 DIAGNOSIS — R262 Difficulty in walking, not elsewhere classified: Secondary | ICD-10-CM

## 2022-12-04 DIAGNOSIS — M25572 Pain in left ankle and joints of left foot: Secondary | ICD-10-CM

## 2022-12-04 NOTE — Therapy (Signed)
OUTPATIENT PHYSICAL THERAPY Treatment    Patient Name: Terri Melendez MRN: [id number] DOB:Jun 27, 1950, 72 y.o., female Today's Date: 12/04/2022  END OF SESSION:  PT End of Session - 12/04/22 1119     Visit Number 2    Number of Visits 17    Date for PT Re-Evaluation 01/25/23    PT Start Time 1119    PT Stop Time 1158    PT Time Calculation (min) 39 min    Activity Tolerance Patient tolerated treatment well    Behavior During Therapy [facility] for tasks assessed/performed              Past Medical History:  Diagnosis Date   Cancer (HCC)    basal cell on face in 2000 or 2001   Hx of basal cell carcinoma 2002   right cheek    Hypertension    Osteopenia    Sinus congestion    Past Surgical History:  Procedure Laterality Date   BASAL CELL CARCINOMA EXCISION     TONSILLECTOMY AND ADENOIDECTOMY     Patient Active Problem List   Diagnosis Date Noted   Plantar fasciitis of left foot 03/28/2022   Anemia 01/10/2022   Allergic rhinitis 06/17/2020   GERD with esophagitis 06/17/2020   Personal history of other malignant neoplasm of skin 06/03/2020   Polyarthralgia 12/15/2019   Thoracic aortic atherosclerosis (HCC) 12/15/2019   Coronary atherosclerosis due to calcified coronary lesion (CODE) 12/15/2019   Encounter for preventive health examination 12/13/2018   History of shingles 05/04/2018   Non-functional thyroid nodule 05/04/2018   Essential hypertension 10/22/2017   Advanced care planning/counseling discussion 10/22/2017   Hypercholesteremia 05/14/2016   OSA on CPAP 10/13/2014    PCP:   Sherlene Shams, MD    REFERRING PROVIDER: Elinor Parkinson, DPM  REFERRING DIAG: M72.2 (ICD-10-CM) - Plantar fasciitis of left foot  Rationale for Evaluation and Treatment: Rehabilitation  THERAPY DIAG:  Pain in left foot  Pain in left ankle and joints of left foot  Difficulty in walking, not elsewhere classified  ONSET DATE: end of January 2024  SUBJECTIVE:                                                                                                                                                                                            SUBJECTIVE STATEMENT: L foot is pretty good today. Was pretty good after last session last week. No pain currently. Has tingling in the bottom of her L foot at times     PERTINENT HISTORY:  L foot plantar fasciitis. Pain began at the end of January 2024, sudden onset. Pt  was driving to [facility] for 5 hours. Wore clogs. The foot at an angle (IV) for 5 hours as well as frequently got up and down from a high truck during the trip might have caused her pain, since her L foot pain began a few days after her trip. Pain worsened initially. Had x-ray which did not reveal any fractures. Was diagnosed with plantar fasciitis. Was given exercises such as seated (on the floor) ankle DF stretch with towel, as well as stretching her plantar fascia with foot on a step, as well as rolling her foot over a frozen water bottle which did not really make her foot much better. Had swelling and made an appontment with Dr. Al Corpus since February. Has had cortisone shots. Has hints of R foot plantar fasciitis as well. L foot pain gets better, then worse, then better, then worse. Wears 8.5 size shoes and got size 9 shoes with inserts. Currently has an appointment to get orthotics professionally made.    No latex allergies HTN is controlled with medicine per pt.   PAIN:  Are you having pain? Yes: NPRS scale: 0/10 Pain location: L heel and posterior tibialis tendon and plantar fascia (at times), and L medial and lateral malleoli posteriorly.  Pain description: half size larger shoe with arch supports Aggravating factors: wearing dress shoes, walking on high heels, possibly prolonged walking  Relieving factors: larger shoe size with arch support  PRECAUTIONS: Other: Osteopenia  RED FLAGS: Bowel or bladder incontinence: No and Cauda equina syndrome:  No   WEIGHT BEARING RESTRICTIONS: No  FALLS:  Has patient fallen in last 6 months? No  LIVING ENVIRONMENT: Lives with: lives with their spouse Lives in: House/apartment Stairs: Yes: Internal: 12 steps; on right going up Has following equipment at home: None  OCCUPATION: retired  PLOF: Independent  PATIENT GOALS: Be able to wear dress shoes, not necessarily heels, but something that is not a tennis shoe.   NEXT MD VISIT: None scheduled yet  OBJECTIVE:  Note: Objective measures were completed at Evaluation unless otherwise noted.  DIAGNOSTIC FINDINGS:    PATIENT SURVEYS:  FOTO L foot FOTO 56 (11/28/2022)  SCREENING FOR RED FLAGS: Bowel or bladder incontinence: No Cauda equina syndrome: No   COGNITION: Overall cognitive status: Within functional limits for tasks assessed     SENSATION:   MUSCLE LENGTH:   POSTURE:  Slight pronation B feet.   PALPATION: TTP L planter foot and first and 2nd digit toe flexor muscles Decrease fascial mobility L heel   LUMBAR ROM:   AROM 12/04/2022  Flexion WFL  Extension limited  Right lateral flexion limited  Left lateral flexion limited  Right rotation WFL  Left rotation WFL   (Blank rows = not tested)  LOWER EXTREMITY ROM:     Active  Right eval Left eval  Hip flexion    Hip extension    Hip abduction    Hip adduction    Hip internal rotation    Hip external rotation    Knee flexion    Knee extension    Ankle dorsiflexion    Ankle plantarflexion    Ankle inversion    Ankle eversion     (Blank rows = not tested)  LOWER EXTREMITY MMT:    MMT Right eval Left eval  Hip flexion 4- 4-  Hip extension 4 4  Hip abduction 4 4  Hip adduction    Hip internal rotation    Hip external rotation 4 4  Knee flexion  5 5  Knee extension 5 5  Ankle dorsiflexion (manually resisted) 4 4  Ankle plantarflexion (manually resisted) 4+ 4+  Ankle inversion (manually resisted) 4 4-  Ankle eversion (manually resisted) 4+ 4    (Blank rows = not tested)  LUMBAR SPECIAL TESTS:  (-) Slump L LE  FUNCTIONAL TESTS:    GAIT: Distance walked: 50 ft Assistive device utilized: None Level of assistance: Complete Independence Comments: antalgic, decreased stance L LE Ascending and descending 4 regular step with R UE assist   R and L genu valgus descending stairs .  TODAY'S TREATMENT:                                                                                                                              DATE: 12/04/2022    Manual therapy   Seated STM L heel, plantar foot and B posterior malleoli  Therapeutic exercise  Standing lumbar AROM  No symptoms in L foot  Standing with B UE assist  Eccentric heel lowering B 10x3  Single leg dead lift with contralateral UE assist   L 10x3  Standing with B UE assist   Ankle DF/PF on rocker board 2 min  Standing with contralateral UE assist   Mini lunge    R 10x3   L 10x3  Standing B gastroc stretch on 4 inch step with B UE assist 1 min x 3    Improved exercise technique, movement at target joints, use of target muscles after mod verbal, visual, tactile cues.         PATIENT EDUCATION:  Education details: POC Person educated: Patient Education method: Explanation Education comprehension: verbalized understanding  HOME EXERCISE PROGRAM: Access Code: [email or web address] URL: [email or web address] Date: 12/04/2022 Prepared by: Loralyn Freshwater  Exercises - Heel Raises with Counter Support  - 1 x daily - 7 x weekly - 3 sets - 10 reps       ASSESSMENT:  CLINICAL IMPRESSION: Worked on decreasing soft tissue restrictions L plantar foot to promote mobility as well as loading to promote proper stress to affected areas for healing. Pt tolerated session well without aggravation of symptoms. Pt will benefit from continued skilled physical therapy services to decrease pain, improve strength and function.      OBJECTIVE IMPAIRMENTS: Abnormal  gait, difficulty walking, improper body mechanics, postural dysfunction, and pain.   ACTIVITY LIMITATIONS: locomotion level  PARTICIPATION LIMITATIONS:   PERSONAL FACTORS: Age, Fitness, Time since onset of injury/illness/exacerbation, and 3+ comorbidities: cancer, HTN, osteopenia  are also affecting patient's functional outcome.   REHAB POTENTIAL: Fair    CLINICAL DECISION MAKING: Stable/uncomplicated  EVALUATION COMPLEXITY: Low   GOALS: Goals reviewed with patient? Yes  SHORT TERM GOALS: Target date: 12/21/2022   Pt will be independent with her initial HEP to decrease pain, improve strength, function, and ability to ambulate as well as wear dress shoes more comfortably for her foot.  Baseline Pt has not yet started her HEP (11/28/2022) Goal  status: INITIAL     LONG TERM GOALS: Target date: 01/25/2023  Pt will have a decrease in L foot pain to 3/10 or less at worst to promote ability to ambulate more comfortably for her foot.  Baseline: 8/10 at worst for the past 3 months (11/28/2022) Goal status: INITIAL  2.  Pt will improve her FOTO score by at least 10 points as a demonstration of improved function.  Baseline: L foot FOTO 56 (11/28/2022) Goal status: INITIAL  3.  Pt will improve her L hip extension and abduction strength to promote femoral control during standing tasks as well as with stair negotiation to decrease plantar pressure to her L foot.  Baseline:  MMT Right eval Left eval  Hip extension 4 4  Hip abduction 4 4  (11/28/2022) Goal status: INITIAL  PLAN:  PT FREQUENCY: 2x/week  PT DURATION: 8 weeks  PLANNED INTERVENTIONS: Therapeutic exercises, Therapeutic activity, Neuromuscular re-education, Balance training, Gait training, Patient/Family education, Joint mobilization, Stair training, Aquatic Therapy, Dry Needling, Electrical stimulation, Ionotophoresis 4mg /ml Dexamethasone, Manual therapy, and Re-evaluation.  PLAN FOR NEXT SESSION: posture, glute med,  max strengthening, femoral control, isometric, concentric, eccentric loading to Achilles, manual techniques, modalities PRN   Reeve Mallo, PT, DPT 12/04/2022, 12:04 PM

## 2022-12-05 ENCOUNTER — Ambulatory Visit: Payer: Medicare HMO | Admitting: Podiatry

## 2022-12-07 ENCOUNTER — Ambulatory Visit: Payer: Medicare HMO

## 2022-12-07 DIAGNOSIS — R262 Difficulty in walking, not elsewhere classified: Secondary | ICD-10-CM

## 2022-12-07 DIAGNOSIS — M79672 Pain in left foot: Secondary | ICD-10-CM | POA: Diagnosis not present

## 2022-12-07 DIAGNOSIS — M25572 Pain in left ankle and joints of left foot: Secondary | ICD-10-CM

## 2022-12-07 NOTE — Therapy (Signed)
OUTPATIENT PHYSICAL THERAPY Treatment    Patient Name: Terri Melendez MRN: [id number] DOB:[birth year], 72 y.o., female Today's Date: 12/07/2022  END OF SESSION:  PT End of Session - 12/07/22 1035     Visit Number 3    Number of Visits 17    Date for PT Re-Evaluation 01/25/23    PT Start Time 1035    PT Stop Time 1119    PT Time Calculation (min) 44 min    Activity Tolerance Patient tolerated treatment well    Behavior During Therapy [facility] for tasks assessed/performed               Past Medical History:  Diagnosis Date   Cancer (HCC)    basal cell on face in 2000 or 2001   Hx of basal cell carcinoma 2002   right cheek    Hypertension    Osteopenia    Sinus congestion    Past Surgical History:  Procedure Laterality Date   BASAL CELL CARCINOMA EXCISION     TONSILLECTOMY AND ADENOIDECTOMY     Patient Active Problem List   Diagnosis Date Noted   Plantar fasciitis of left foot 03/28/2022   Anemia 01/10/2022   Allergic rhinitis 06/17/2020   GERD with esophagitis 06/17/2020   Personal history of other malignant neoplasm of skin 06/03/2020   Polyarthralgia 12/15/2019   Thoracic aortic atherosclerosis (HCC) 12/15/2019   Coronary atherosclerosis due to calcified coronary lesion (CODE) 12/15/2019   Encounter for preventive health examination 12/13/2018   History of shingles 05/04/2018   Non-functional thyroid nodule 05/04/2018   Essential hypertension 10/22/2017   Advanced care planning/counseling discussion 10/22/2017   Hypercholesteremia 05/14/2016   OSA on CPAP 10/13/2014    PCP:   Sherlene Shams, MD    REFERRING PROVIDER: Elinor Parkinson, DPM  REFERRING DIAG: M72.2 (ICD-10-CM) - Plantar fasciitis of left foot  Rationale for Evaluation and Treatment: Rehabilitation  THERAPY DIAG:  Pain in left foot  Pain in left ankle and joints of left foot  Difficulty in walking, not elsewhere classified  ONSET DATE: end of January 2024  SUBJECTIVE:                                                                                                                                                                                            SUBJECTIVE STATEMENT: L foot feels ok, pretty good, legs and hips are sore. 2/10 plantar foot pain currently when walking.       PERTINENT HISTORY:  L foot plantar fasciitis. Pain began at the end of January 2024, sudden onset. Pt was driving to [facility] for 5  hours. Wore clogs. The foot at an angle (IV) for 5 hours as well as frequently got up and down from a high truck during the trip might have caused her pain, since her L foot pain began a few days after her trip. Pain worsened initially. Had x-ray which did not reveal any fractures. Was diagnosed with plantar fasciitis. Was given exercises such as seated (on the floor) ankle DF stretch with towel, as well as stretching her plantar fascia with foot on a step, as well as rolling her foot over a frozen water bottle which did not really make her foot much better. Had swelling and made an appontment with Dr. Al Corpus since February. Has had cortisone shots. Has hints of R foot plantar fasciitis as well. L foot pain gets better, then worse, then better, then worse. Wears 8.5 size shoes and got size 9 shoes with inserts. Currently has an appointment to get orthotics professionally made.    No latex allergies HTN is controlled with medicine per pt.   PAIN:  Are you having pain? Yes: NPRS scale: 0/10 Pain location: L heel and posterior tibialis tendon and plantar fascia (at times), and L medial and lateral malleoli posteriorly.  Pain description: half size larger shoe with arch supports Aggravating factors: wearing dress shoes, walking on high heels, possibly prolonged walking  Relieving factors: larger shoe size with arch support  PRECAUTIONS: Other: Osteopenia  RED FLAGS: Bowel or bladder incontinence: No and Cauda equina syndrome: No   WEIGHT BEARING RESTRICTIONS:  No  FALLS:  Has patient fallen in last 6 months? No  LIVING ENVIRONMENT: Lives with: lives with their spouse Lives in: House/apartment Stairs: Yes: Internal: 12 steps; on right going up Has following equipment at home: None  OCCUPATION: retired  PLOF: Independent  PATIENT GOALS: Be able to wear dress shoes, not necessarily heels, but something that is not a tennis shoe.   NEXT MD VISIT: None scheduled yet  OBJECTIVE:  Note: Objective measures were completed at Evaluation unless otherwise noted.  DIAGNOSTIC FINDINGS:    PATIENT SURVEYS:  FOTO L foot FOTO 56 (11/28/2022)  SCREENING FOR RED FLAGS: Bowel or bladder incontinence: No Cauda equina syndrome: No   COGNITION: Overall cognitive status: Within functional limits for tasks assessed     SENSATION:   MUSCLE LENGTH:   POSTURE:  Slight pronation B feet.   PALPATION: TTP L planter foot and first and 2nd digit toe flexor muscles Decrease fascial mobility L heel   LUMBAR ROM:   AROM 12/04/2022  Flexion WFL  Extension limited  Right lateral flexion limited  Left lateral flexion limited  Right rotation WFL  Left rotation WFL   (Blank rows = not tested)  LOWER EXTREMITY ROM:     Active  Right eval Left eval  Hip flexion    Hip extension    Hip abduction    Hip adduction    Hip internal rotation    Hip external rotation    Knee flexion    Knee extension    Ankle dorsiflexion    Ankle plantarflexion    Ankle inversion    Ankle eversion     (Blank rows = not tested)  LOWER EXTREMITY MMT:    MMT Right eval Left eval  Hip flexion 4- 4-  Hip extension 4 4  Hip abduction 4 4  Hip adduction    Hip internal rotation    Hip external rotation 4 4  Knee flexion 5 5  Knee extension 5 5  Ankle dorsiflexion (manually resisted) 4 4  Ankle plantarflexion (manually resisted) 4+ 4+  Ankle inversion (manually resisted) 4 4-  Ankle eversion (manually resisted) 4+ 4   (Blank rows = not  tested)  LUMBAR SPECIAL TESTS:  (-) Slump L LE  FUNCTIONAL TESTS:    GAIT: Distance walked: 50 ft Assistive device utilized: None Level of assistance: Complete Independence Comments: antalgic, decreased stance L LE Ascending and descending 4 regular step with R UE assist   R and L genu valgus descending stairs .  TODAY'S TREATMENT:                                                                                                                              DATE: 12/07/2022    Therapeutic exercise  Standing with B UE assist  Eccentric heel lowering B 10x3   Standing with B UE assist   Ankle DF/PF on rocker board 2 min  No L foot pain after aforementioned exercises   Single leg dead lift with contralateral UE assist   L 10x3  Standing forward weight shift onto forefeet, heels on floor 10x5 seconds with B UE assist PRN   Standing with contralateral UE assist   Mini lunge    R 10x3   L 10x3  Standing L great toe stretch 1 min x 3  Standing B gastroc stretch on first regular step with B UE assist 1 min x 2   Improved exercise technique, movement at target joints, use of target muscles after mod verbal, visual, tactile cues.    Manual therapy   Seated STM L heel, plantar foot and B posterior malleoli to decrease fascial restrictions Seated STM flexor digitorum brevis muscle to decrease tension.     PATIENT EDUCATION:  Education details: POC Person educated: Patient Education method: Explanation Education comprehension: verbalized understanding  HOME EXERCISE PROGRAM: Access Code: [email or web address] URL: [email or web address] Date: 12/04/2022 Prepared by: Loralyn Freshwater  Exercises - Heel Raises with Counter Support  - 1 x daily - 7 x weekly - 3 sets - 10 reps       ASSESSMENT:  CLINICAL IMPRESSION: Continued working on loading of Achilles and plantar foot muscles to promote healing. Continued with STM to decrease fascial restrictions and  muscle tension to plantar foot. No pain reported after session. Pt will benefit from continued skilled physical therapy services to decrease pain, improve strength and function.      OBJECTIVE IMPAIRMENTS: Abnormal gait, difficulty walking, improper body mechanics, postural dysfunction, and pain.   ACTIVITY LIMITATIONS: locomotion level  PARTICIPATION LIMITATIONS:   PERSONAL FACTORS: Age, Fitness, Time since onset of injury/illness/exacerbation, and 3+ comorbidities: cancer, HTN, osteopenia  are also affecting patient's functional outcome.   REHAB POTENTIAL: Fair    CLINICAL DECISION MAKING: Stable/uncomplicated  EVALUATION COMPLEXITY: Low   GOALS: Goals reviewed with patient? Yes  SHORT TERM GOALS: Target date: 12/21/2022   Pt will be independent with her initial HEP to decrease pain, improve  strength, function, and ability to ambulate as well as wear dress shoes more comfortably for her foot.  Baseline Pt has not yet started her HEP (11/28/2022) Goal status: INITIAL     LONG TERM GOALS: Target date: 01/25/2023  Pt will have a decrease in L foot pain to 3/10 or less at worst to promote ability to ambulate more comfortably for her foot.  Baseline: 8/10 at worst for the past 3 months (11/28/2022) Goal status: INITIAL  2.  Pt will improve her FOTO score by at least 10 points as a demonstration of improved function.  Baseline: L foot FOTO 56 (11/28/2022) Goal status: INITIAL  3.  Pt will improve her L hip extension and abduction strength to promote femoral control during standing tasks as well as with stair negotiation to decrease plantar pressure to her L foot.  Baseline:  MMT Right eval Left eval  Hip extension 4 4  Hip abduction 4 4  (11/28/2022) Goal status: INITIAL  PLAN:  PT FREQUENCY: 2x/week  PT DURATION: 8 weeks  PLANNED INTERVENTIONS: Therapeutic exercises, Therapeutic activity, Neuromuscular re-education, Balance training, Gait training, Patient/Family  education, Joint mobilization, Stair training, Aquatic Therapy, Dry Needling, Electrical stimulation, Ionotophoresis 4mg /ml Dexamethasone, Manual therapy, and Re-evaluation.  PLAN FOR NEXT SESSION: posture, glute med, max strengthening, femoral control, isometric, concentric, eccentric loading to Achilles, manual techniques, modalities PRN   Alesha Jaffee, PT, DPT 12/07/2022, 11:30 AM

## 2022-12-11 ENCOUNTER — Ambulatory Visit: Payer: Medicare HMO

## 2022-12-11 DIAGNOSIS — M25572 Pain in left ankle and joints of left foot: Secondary | ICD-10-CM

## 2022-12-11 DIAGNOSIS — M79672 Pain in left foot: Secondary | ICD-10-CM | POA: Diagnosis not present

## 2022-12-11 DIAGNOSIS — R262 Difficulty in walking, not elsewhere classified: Secondary | ICD-10-CM

## 2022-12-11 NOTE — Therapy (Signed)
OUTPATIENT PHYSICAL THERAPY Treatment    Patient Name: Terri Melendez MRN: [id number] DOB:[birth year], 72 y.o., female Today's Date: 12/11/2022  END OF SESSION:  PT End of Session - 12/11/22 1427     Visit Number 4    Number of Visits 17    Date for PT Re-Evaluation 01/25/23    PT Start Time 1430    PT Stop Time 1510    PT Time Calculation (min) 40 min    Activity Tolerance Patient tolerated treatment well    Behavior During Therapy [facility] for tasks assessed/performed                Past Medical History:  Diagnosis Date   Cancer (HCC)    basal cell on face in 2000 or 2001   Hx of basal cell carcinoma 2002   right cheek    Hypertension    Osteopenia    Sinus congestion    Past Surgical History:  Procedure Laterality Date   BASAL CELL CARCINOMA EXCISION     TONSILLECTOMY AND ADENOIDECTOMY     Patient Active Problem List   Diagnosis Date Noted   Plantar fasciitis of left foot 03/28/2022   Anemia 01/10/2022   Allergic rhinitis 06/17/2020   GERD with esophagitis 06/17/2020   Personal history of other malignant neoplasm of skin 06/03/2020   Polyarthralgia 12/15/2019   Thoracic aortic atherosclerosis (HCC) 12/15/2019   Coronary atherosclerosis due to calcified coronary lesion (CODE) 12/15/2019   Encounter for preventive health examination 12/13/2018   History of shingles 05/04/2018   Non-functional thyroid nodule 05/04/2018   Essential hypertension 10/22/2017   Advanced care planning/counseling discussion 10/22/2017   Hypercholesteremia 05/14/2016   OSA on CPAP 10/13/2014    PCP:   Sherlene Shams, MD    REFERRING PROVIDER: Elinor Parkinson, DPM  REFERRING DIAG: M72.2 (ICD-10-CM) - Plantar fasciitis of left foot  Rationale for Evaluation and Treatment: Rehabilitation  THERAPY DIAG:  Pain in left foot  Pain in left ankle and joints of left foot  Difficulty in walking, not elsewhere classified  ONSET DATE: end of January 2024  SUBJECTIVE:                                                                                                                                                                                            SUBJECTIVE STATEMENT: L foot is about a 3-4/10 today. Might have done something yesterday which bothered her whole leg. Also feels L LE pain (L5 dermatome). Stood up awkwardly from a low stool. Foot seemed like it felt better after last session.        PERTINENT  HISTORY:  L foot plantar fasciitis. Pain began at the end of January 2024, sudden onset. Pt was driving to [facility] for 5 hours. Wore clogs. The foot at an angle (IV) for 5 hours as well as frequently got up and down from a high truck during the trip might have caused her pain, since her L foot pain began a few days after her trip. Pain worsened initially. Had x-ray which did not reveal any fractures. Was diagnosed with plantar fasciitis. Was given exercises such as seated (on the floor) ankle DF stretch with towel, as well as stretching her plantar fascia with foot on a step, as well as rolling her foot over a frozen water bottle which did not really make her foot much better. Had swelling and made an appontment with Dr. Al Corpus since February. Has had cortisone shots. Has hints of R foot plantar fasciitis as well. L foot pain gets better, then worse, then better, then worse. Wears 8.5 size shoes and got size 9 shoes with inserts. Currently has an appointment to get orthotics professionally made.    No latex allergies HTN is controlled with medicine per pt.   PAIN:  Are you having pain? Yes: NPRS scale: 0/10 Pain location: L heel and posterior tibialis tendon and plantar fascia (at times), and L medial and lateral malleoli posteriorly.  Pain description: half size larger shoe with arch supports Aggravating factors: wearing dress shoes, walking on high heels, possibly prolonged walking  Relieving factors: larger shoe size with arch support  PRECAUTIONS: Other:  Osteopenia  RED FLAGS: Bowel or bladder incontinence: No and Cauda equina syndrome: No   WEIGHT BEARING RESTRICTIONS: No  FALLS:  Has patient fallen in last 6 months? No  LIVING ENVIRONMENT: Lives with: lives with their spouse Lives in: House/apartment Stairs: Yes: Internal: 12 steps; on right going up Has following equipment at home: None  OCCUPATION: retired  PLOF: Independent  PATIENT GOALS: Be able to wear dress shoes, not necessarily heels, but something that is not a tennis shoe.   NEXT MD VISIT: None scheduled yet  OBJECTIVE:  Note: Objective measures were completed at Evaluation unless otherwise noted.  DIAGNOSTIC FINDINGS:    PATIENT SURVEYS:  FOTO L foot FOTO 56 (11/28/2022)  SCREENING FOR RED FLAGS: Bowel or bladder incontinence: No Cauda equina syndrome: No   COGNITION: Overall cognitive status: Within functional limits for tasks assessed     SENSATION:   MUSCLE LENGTH:   POSTURE:  Slight pronation B feet.   PALPATION: TTP L planter foot and first and 2nd digit toe flexor muscles Decrease fascial mobility L heel   LUMBAR ROM:   AROM 12/04/2022  Flexion WFL  Extension limited  Right lateral flexion limited  Left lateral flexion limited  Right rotation WFL  Left rotation WFL   (Blank rows = not tested)  LOWER EXTREMITY ROM:     Active  Right eval Left eval  Hip flexion    Hip extension    Hip abduction    Hip adduction    Hip internal rotation    Hip external rotation    Knee flexion    Knee extension    Ankle dorsiflexion    Ankle plantarflexion    Ankle inversion    Ankle eversion     (Blank rows = not tested)  LOWER EXTREMITY MMT:    MMT Right eval Left eval  Hip flexion 4- 4-  Hip extension 4 4  Hip abduction 4 4  Hip adduction  Hip internal rotation    Hip external rotation 4 4  Knee flexion 5 5  Knee extension 5 5  Ankle dorsiflexion (manually resisted) 4 4  Ankle plantarflexion (manually resisted) 4+ 4+   Ankle inversion (manually resisted) 4 4-  Ankle eversion (manually resisted) 4+ 4   (Blank rows = not tested)  LUMBAR SPECIAL TESTS:  (-) Slump L LE  FUNCTIONAL TESTS:    GAIT: Distance walked: 50 ft Assistive device utilized: None Level of assistance: Complete Independence Comments: antalgic, decreased stance L LE Ascending and descending 4 regular step with R UE assist   R and L genu valgus descending stairs .  TODAY'S TREATMENT:                                                                                                                              DATE: 12/11/2022    Therapeutic exercise   Standing lumbar extension reproduced L lateral hip and thigh symptoms.   Hooklying  posterior pelvic tilt 10x5 seconds for 3 sets  SLR hip flexion L 10x (for L LE neural flossing)  Crunch 10x2  Decreased L L5 and plantar foot pain after aforementioned exercises.    Single leg dead lift with contralateral UE assist   L 10x3  Standing with B UE assist   Ankle DF/PF on rocker board 2 min   Standing L great toe stretch 1 min x 3  Standing forward weight shift onto forefeet, heels on floor 10x5 seconds for 2 sets with B UE assist PRN   Standing with contralateral UE assist   Mini lunge    R 10x3   L 10x3     Improved exercise technique, movement at target joints, use of target muscles after mod verbal, visual, tactile cues.      PATIENT EDUCATION:  Education details: POC Person educated: Patient Education method: Explanation Education comprehension: verbalized understanding  HOME EXERCISE PROGRAM: Access Code: [email or web address] URL: [email or web address] Date: 12/04/2022 Prepared by: Loralyn Freshwater  Exercises - Heel Raises with Counter Support  - 1 x daily - 7 x weekly - 3 sets - 10 reps - Supine Posterior Pelvic Tilt  - 1 x daily - 7 x weekly - 3 sets - 10 reps - 5 seconds hold - Standing Toe Dorsiflexion Stretch  - 2 x daily - 7 x weekly - 1  sets - 3 reps - 1 minute hold     ASSESSMENT:  CLINICAL IMPRESSION: Decreased L LE pain with treatment to decrease extension pressure to low back L5 nerves. Continued working on loading of Achilles and plantar foot muscles to promote healing. No pain reported after session. Pt will benefit from continued skilled physical therapy services to decrease pain, improve strength and function.      OBJECTIVE IMPAIRMENTS: Abnormal gait, difficulty walking, improper body mechanics, postural dysfunction, and pain.   ACTIVITY LIMITATIONS: locomotion level  PARTICIPATION LIMITATIONS:   PERSONAL FACTORS: Age, Fitness, Time since onset of  injury/illness/exacerbation, and 3+ comorbidities: cancer, HTN, osteopenia  are also affecting patient's functional outcome.   REHAB POTENTIAL: Fair    CLINICAL DECISION MAKING: Stable/uncomplicated  EVALUATION COMPLEXITY: Low   GOALS: Goals reviewed with patient? Yes  SHORT TERM GOALS: Target date: 12/21/2022   Pt will be independent with her initial HEP to decrease pain, improve strength, function, and ability to ambulate as well as wear dress shoes more comfortably for her foot.  Baseline Pt has not yet started her HEP (11/28/2022) Goal status: INITIAL     LONG TERM GOALS: Target date: 01/25/2023  Pt will have a decrease in L foot pain to 3/10 or less at worst to promote ability to ambulate more comfortably for her foot.  Baseline: 8/10 at worst for the past 3 months (11/28/2022) Goal status: INITIAL  2.  Pt will improve her FOTO score by at least 10 points as a demonstration of improved function.  Baseline: L foot FOTO 56 (11/28/2022) Goal status: INITIAL  3.  Pt will improve her L hip extension and abduction strength to promote femoral control during standing tasks as well as with stair negotiation to decrease plantar pressure to her L foot.  Baseline:  MMT Right eval Left eval  Hip extension 4 4  Hip abduction 4 4  (11/28/2022) Goal  status: INITIAL  PLAN:  PT FREQUENCY: 2x/week  PT DURATION: 8 weeks  PLANNED INTERVENTIONS: Therapeutic exercises, Therapeutic activity, Neuromuscular re-education, Balance training, Gait training, Patient/Family education, Joint mobilization, Stair training, Aquatic Therapy, Dry Needling, Electrical stimulation, Ionotophoresis 4mg /ml Dexamethasone, Manual therapy, and Re-evaluation.  PLAN FOR NEXT SESSION: posture, glute med, max strengthening, femoral control, isometric, concentric, eccentric loading to Achilles, manual techniques, modalities PRN   Tayvion Lauder, PT, DPT 12/11/2022, 4:05 PM

## 2022-12-18 ENCOUNTER — Encounter: Payer: Self-pay | Admitting: Internal Medicine

## 2022-12-18 MED ORDER — CETIRIZINE HCL 10 MG PO TABS: 10.0000 mg | ORAL_TABLET | Freq: Every day | ORAL | 1 refills | Status: DC

## 2022-12-20 ENCOUNTER — Ambulatory Visit: Payer: Medicare HMO

## 2022-12-20 DIAGNOSIS — M25572 Pain in left ankle and joints of left foot: Secondary | ICD-10-CM

## 2022-12-20 DIAGNOSIS — M79672 Pain in left foot: Secondary | ICD-10-CM

## 2022-12-20 DIAGNOSIS — R262 Difficulty in walking, not elsewhere classified: Secondary | ICD-10-CM

## 2022-12-20 NOTE — Therapy (Signed)
OUTPATIENT PHYSICAL THERAPY Treatment    Patient Name: Terri Melendez MRN: [id number] DOB:01-13-1951, 72 y.o., female Today's Date: 12/20/2022  END OF SESSION:  PT End of Session - 12/20/22 1348     Visit Number 5    Number of Visits 17    Date for PT Re-Evaluation 01/25/23    PT Start Time 1348    PT Stop Time 1427    PT Time Calculation (min) 39 min    Activity Tolerance Patient tolerated treatment well    Behavior During Therapy [facility] for tasks assessed/performed                 Past Medical History:  Diagnosis Date   Cancer (HCC)    basal cell on face in 2000 or 2001   Hx of basal cell carcinoma 2002   right cheek    Hypertension    Osteopenia    Sinus congestion    Past Surgical History:  Procedure Laterality Date   BASAL CELL CARCINOMA EXCISION     TONSILLECTOMY AND ADENOIDECTOMY     Patient Active Problem List   Diagnosis Date Noted   Plantar fasciitis of left foot 03/28/2022   Anemia 01/10/2022   Allergic rhinitis 06/17/2020   GERD with esophagitis 06/17/2020   Personal history of other malignant neoplasm of skin 06/03/2020   Polyarthralgia 12/15/2019   Thoracic aortic atherosclerosis (HCC) 12/15/2019   Coronary atherosclerosis due to calcified coronary lesion (CODE) 12/15/2019   Encounter for preventive health examination 12/13/2018   History of shingles 05/04/2018   Non-functional thyroid nodule 05/04/2018   Essential hypertension 10/22/2017   Advanced care planning/counseling discussion 10/22/2017   Hypercholesteremia 05/14/2016   OSA on CPAP 10/13/2014    PCP:   Sherlene Shams, MD    REFERRING PROVIDER: Elinor Parkinson, DPM  REFERRING DIAG: M72.2 (ICD-10-CM) - Plantar fasciitis of left foot  Rationale for Evaluation and Treatment: Rehabilitation  THERAPY DIAG:  Pain in left foot  Pain in left ankle and joints of left foot  Difficulty in walking, not elsewhere classified  ONSET DATE: end of January 2024  SUBJECTIVE:                                                                                                                                                                                            SUBJECTIVE STATEMENT: L foot is about a 3/10 today. Has been less some days. 4/10 at most for the past 7 days. L lateral thigh pain is gone. The frequency of foot pain is less.    PERTINENT HISTORY:  L foot plantar fasciitis. Pain began at the  end of January 2024, sudden onset. Pt was driving to [facility] for 5 hours. Wore clogs. The foot at an angle (IV) for 5 hours as well as frequently got up and down from a high truck during the trip might have caused her pain, since her L foot pain began a few days after her trip. Pain worsened initially. Had x-ray which did not reveal any fractures. Was diagnosed with plantar fasciitis. Was given exercises such as seated (on the floor) ankle DF stretch with towel, as well as stretching her plantar fascia with foot on a step, as well as rolling her foot over a frozen water bottle which did not really make her foot much better. Had swelling and made an appontment with Dr. Al Corpus since February. Has had cortisone shots. Has hints of R foot plantar fasciitis as well. L foot pain gets better, then worse, then better, then worse. Wears 8.5 size shoes and got size 9 shoes with inserts. Currently has an appointment to get orthotics professionally made.    No latex allergies HTN is controlled with medicine per pt.   PAIN:  Are you having pain? Yes: NPRS scale: 0/10 Pain location: L heel and posterior tibialis tendon and plantar fascia (at times), and L medial and lateral malleoli posteriorly.  Pain description: half size larger shoe with arch supports Aggravating factors: wearing dress shoes, walking on high heels, possibly prolonged walking  Relieving factors: larger shoe size with arch support  PRECAUTIONS: Other: Osteopenia  RED FLAGS: Bowel or bladder incontinence: No and Cauda  equina syndrome: No   WEIGHT BEARING RESTRICTIONS: No  FALLS:  Has patient fallen in last 6 months? No  LIVING ENVIRONMENT: Lives with: lives with their spouse Lives in: House/apartment Stairs: Yes: Internal: 12 steps; on right going up Has following equipment at home: None  OCCUPATION: retired  PLOF: Independent  PATIENT GOALS: Be able to wear dress shoes, not necessarily heels, but something that is not a tennis shoe.   NEXT MD VISIT: None scheduled yet  OBJECTIVE:  Note: Objective measures were completed at Evaluation unless otherwise noted.  DIAGNOSTIC FINDINGS:    PATIENT SURVEYS:  FOTO L foot FOTO 56 (11/28/2022)  SCREENING FOR RED FLAGS: Bowel or bladder incontinence: No Cauda equina syndrome: No   COGNITION: Overall cognitive status: Within functional limits for tasks assessed     SENSATION:   MUSCLE LENGTH:   POSTURE:  Slight pronation B feet.   PALPATION: TTP L planter foot and first and 2nd digit toe flexor muscles Decrease fascial mobility L heel   LUMBAR ROM:   AROM 12/04/2022  Flexion WFL  Extension limited  Right lateral flexion limited  Left lateral flexion limited  Right rotation WFL  Left rotation WFL   (Blank rows = not tested)  LOWER EXTREMITY ROM:     Active  Right eval Left eval  Hip flexion    Hip extension    Hip abduction    Hip adduction    Hip internal rotation    Hip external rotation    Knee flexion    Knee extension    Ankle dorsiflexion    Ankle plantarflexion    Ankle inversion    Ankle eversion     (Blank rows = not tested)  LOWER EXTREMITY MMT:    MMT Right eval Left eval  Hip flexion 4- 4-  Hip extension 4 4  Hip abduction 4 4  Hip adduction    Hip internal rotation    Hip  external rotation 4 4  Knee flexion 5 5  Knee extension 5 5  Ankle dorsiflexion (manually resisted) 4 4  Ankle plantarflexion (manually resisted) 4+ 4+  Ankle inversion (manually resisted) 4 4-  Ankle eversion (manually  resisted) 4+ 4   (Blank rows = not tested)  LUMBAR SPECIAL TESTS:  (-) Slump L LE  FUNCTIONAL TESTS:    GAIT: Distance walked: 50 ft Assistive device utilized: None Level of assistance: Complete Independence Comments: antalgic, decreased stance L LE Ascending and descending 4 regular step with R UE assist   R and L genu valgus descending stairs .  TODAY'S TREATMENT:                                                                                                                              DATE: 12/20/2022    Manual therapy  Seated STM L plantar foot and heel to decrease fascial restrictions   Therapeutic exercise  Standing L great toe stretch 1 min x 3  Hooklying  posterior pelvic tilt 10x10 seconds for 3 sets  SLR hip flexion L 10x, then 5x (for L LE neural flossing)  Crunch 10x2  L foot feels better after aforementioned treatment   Single leg dead lift with contralateral UE assist   L 10x3  Standing forward weight shift onto forefeet, heels on floor 10x5 seconds for 2 sets with B UE assist PRN  Standing with contralateral UE assist   Mini lunge    R 10x2   L 10x2   Improved exercise technique, movement at target joints, use of target muscles after mod verbal, visual, tactile cues.      PATIENT EDUCATION:  Education details: POC Person educated: Patient Education method: Explanation Education comprehension: verbalized understanding  HOME EXERCISE PROGRAM: Access Code: [email or web address] URL: [email or web address] Date: 12/04/2022 Prepared by: Loralyn Freshwater  Exercises - Heel Raises with Counter Support  - 1 x daily - 7 x weekly - 3 sets - 10 reps - Supine Posterior Pelvic Tilt  - 1 x daily - 7 x weekly - 3 sets - 10 reps - 5 seconds hold - Standing Toe Dorsiflexion Stretch  - 2 x daily - 7 x weekly - 1 sets - 3 reps - 1 minute hold - Curl Up with Arms Crossed  - 1 x daily - 7 x weekly - 2 sets - 10 reps    ASSESSMENT:  CLINICAL  IMPRESSION: Decreased L foot pain with treatment to improve fascial mobility and decrease extension pressure to low back L5 nerves. Pt will benefit from continued skilled physical therapy services to decrease pain, improve strength and function.      OBJECTIVE IMPAIRMENTS: Abnormal gait, difficulty walking, improper body mechanics, postural dysfunction, and pain.   ACTIVITY LIMITATIONS: locomotion level  PARTICIPATION LIMITATIONS:   PERSONAL FACTORS: Age, Fitness, Time since onset of injury/illness/exacerbation, and 3+ comorbidities: cancer, HTN, osteopenia  are also affecting patient's functional outcome.   REHAB POTENTIAL: Fair  CLINICAL DECISION MAKING: Stable/uncomplicated  EVALUATION COMPLEXITY: Low   GOALS: Goals reviewed with patient? Yes  SHORT TERM GOALS: Target date: 12/21/2022   Pt will be independent with her initial HEP to decrease pain, improve strength, function, and ability to ambulate as well as wear dress shoes more comfortably for her foot.  Baseline Pt has not yet started her HEP (11/28/2022) Goal status: INITIAL     LONG TERM GOALS: Target date: 01/25/2023  Pt will have a decrease in L foot pain to 3/10 or less at worst to promote ability to ambulate more comfortably for her foot.  Baseline: 8/10 at worst for the past 3 months (11/28/2022) Goal status: INITIAL  2.  Pt will improve her FOTO score by at least 10 points as a demonstration of improved function.  Baseline: L foot FOTO 56 (11/28/2022) Goal status: INITIAL  3.  Pt will improve her L hip extension and abduction strength to promote femoral control during standing tasks as well as with stair negotiation to decrease plantar pressure to her L foot.  Baseline:  MMT Right eval Left eval  Hip extension 4 4  Hip abduction 4 4  (11/28/2022) Goal status: INITIAL  PLAN:  PT FREQUENCY: 2x/week  PT DURATION: 8 weeks  PLANNED INTERVENTIONS: Therapeutic exercises, Therapeutic activity,  Neuromuscular re-education, Balance training, Gait training, Patient/Family education, Joint mobilization, Stair training, Aquatic Therapy, Dry Needling, Electrical stimulation, Ionotophoresis 4mg /ml Dexamethasone, Manual therapy, and Re-evaluation.  PLAN FOR NEXT SESSION: posture, glute med, max strengthening, femoral control, isometric, concentric, eccentric loading to Achilles, manual techniques, modalities PRN   Javiana Anwar, PT, DPT 12/20/2022, 5:03 PM

## 2022-12-24 ENCOUNTER — Ambulatory Visit: Payer: Medicare HMO

## 2022-12-24 DIAGNOSIS — M25572 Pain in left ankle and joints of left foot: Secondary | ICD-10-CM

## 2022-12-24 DIAGNOSIS — R262 Difficulty in walking, not elsewhere classified: Secondary | ICD-10-CM

## 2022-12-24 DIAGNOSIS — M79672 Pain in left foot: Secondary | ICD-10-CM

## 2022-12-24 NOTE — Therapy (Signed)
OUTPATIENT PHYSICAL THERAPY Treatment    Patient Name: Terri Melendez MRN: [id number] DOB:Jul 17, 1950, 72 y.o., female Today's Date: 12/24/2022  END OF SESSION:  PT End of Session - 12/24/22 0948     Visit Number 6    Number of Visits 17    Date for PT Re-Evaluation 01/25/23    PT Start Time 0949    PT Stop Time 1027    PT Time Calculation (min) 38 min    Activity Tolerance Patient tolerated treatment well    Behavior During Therapy [facility] for tasks assessed/performed                  Past Medical History:  Diagnosis Date   Cancer (HCC)    basal cell on face in 2000 or 2001   Hx of basal cell carcinoma 2002   right cheek    Hypertension    Osteopenia    Sinus congestion    Past Surgical History:  Procedure Laterality Date   BASAL CELL CARCINOMA EXCISION     TONSILLECTOMY AND ADENOIDECTOMY     Patient Active Problem List   Diagnosis Date Noted   Plantar fasciitis of left foot 03/28/2022   Anemia 01/10/2022   Allergic rhinitis 06/17/2020   GERD with esophagitis 06/17/2020   Personal history of other malignant neoplasm of skin 06/03/2020   Polyarthralgia 12/15/2019   Thoracic aortic atherosclerosis (HCC) 12/15/2019   Coronary atherosclerosis due to calcified coronary lesion (CODE) 12/15/2019   Encounter for preventive health examination 12/13/2018   History of shingles 05/04/2018   Non-functional thyroid nodule 05/04/2018   Essential hypertension 10/22/2017   Advanced care planning/counseling discussion 10/22/2017   Hypercholesteremia 05/14/2016   OSA on CPAP 10/13/2014    PCP:   Sherlene Shams, MD    REFERRING PROVIDER: Elinor Parkinson, DPM  REFERRING DIAG: M72.2 (ICD-10-CM) - Plantar fasciitis of left foot  Rationale for Evaluation and Treatment: Rehabilitation  THERAPY DIAG:  Pain in left foot  Pain in left ankle and joints of left foot  Difficulty in walking, not elsewhere classified  ONSET DATE: end of January 2024  SUBJECTIVE:                                                                                                                                                                                            SUBJECTIVE STATEMENT: L foot feels better. No L foot pain currently.      PERTINENT HISTORY:  L foot plantar fasciitis. Pain began at the end of January 2024, sudden onset. Pt was driving to [facility] for 5 hours. Wore clogs. The foot at an  angle (IV) for 5 hours as well as frequently got up and down from a high truck during the trip might have caused her pain, since her L foot pain began a few days after her trip. Pain worsened initially. Had x-ray which did not reveal any fractures. Was diagnosed with plantar fasciitis. Was given exercises such as seated (on the floor) ankle DF stretch with towel, as well as stretching her plantar fascia with foot on a step, as well as rolling her foot over a frozen water bottle which did not really make her foot much better. Had swelling and made an appontment with Dr. Al Corpus since February. Has had cortisone shots. Has hints of R foot plantar fasciitis as well. L foot pain gets better, then worse, then better, then worse. Wears 8.5 size shoes and got size 9 shoes with inserts. Currently has an appointment to get orthotics professionally made.    No latex allergies HTN is controlled with medicine per pt.   PAIN:  Are you having pain? Yes: NPRS scale: 0/10 Pain location: L heel and posterior tibialis tendon and plantar fascia (at times), and L medial and lateral malleoli posteriorly.  Pain description: half size larger shoe with arch supports Aggravating factors: wearing dress shoes, walking on high heels, possibly prolonged walking  Relieving factors: larger shoe size with arch support  PRECAUTIONS: Other: Osteopenia  RED FLAGS: Bowel or bladder incontinence: No and Cauda equina syndrome: No   WEIGHT BEARING RESTRICTIONS: No  FALLS:  Has patient fallen in last 6 months?  No  LIVING ENVIRONMENT: Lives with: lives with their spouse Lives in: House/apartment Stairs: Yes: Internal: 12 steps; on right going up Has following equipment at home: None  OCCUPATION: retired  PLOF: Independent  PATIENT GOALS: Be able to wear dress shoes, not necessarily heels, but something that is not a tennis shoe.   NEXT MD VISIT: None scheduled yet  OBJECTIVE:  Note: Objective measures were completed at Evaluation unless otherwise noted.  DIAGNOSTIC FINDINGS:    PATIENT SURVEYS:  FOTO L foot FOTO 56 (11/28/2022)  SCREENING FOR RED FLAGS: Bowel or bladder incontinence: No Cauda equina syndrome: No   COGNITION: Overall cognitive status: Within functional limits for tasks assessed     SENSATION:   MUSCLE LENGTH:   POSTURE:  Slight pronation B feet.   PALPATION: TTP L planter foot and first and 2nd digit toe flexor muscles Decrease fascial mobility L heel   LUMBAR ROM:   AROM 12/04/2022  Flexion WFL  Extension limited  Right lateral flexion limited  Left lateral flexion limited  Right rotation WFL  Left rotation WFL   (Blank rows = not tested)  LOWER EXTREMITY ROM:     Active  Right eval Left eval  Hip flexion    Hip extension    Hip abduction    Hip adduction    Hip internal rotation    Hip external rotation    Knee flexion    Knee extension    Ankle dorsiflexion    Ankle plantarflexion    Ankle inversion    Ankle eversion     (Blank rows = not tested)  LOWER EXTREMITY MMT:    MMT Right eval Left eval  Hip flexion 4- 4-  Hip extension 4 4  Hip abduction 4 4  Hip adduction    Hip internal rotation    Hip external rotation 4 4  Knee flexion 5 5  Knee extension 5 5  Ankle dorsiflexion (manually resisted) 4 4  Ankle plantarflexion (manually resisted) 4+ 4+  Ankle inversion (manually resisted) 4 4-  Ankle eversion (manually resisted) 4+ 4   (Blank rows = not tested)  LUMBAR SPECIAL TESTS:  (-) Slump L LE  FUNCTIONAL  TESTS:    GAIT: Distance walked: 50 ft Assistive device utilized: None Level of assistance: Complete Independence Comments: antalgic, decreased stance L LE Ascending and descending 4 regular step with R UE assist   R and L genu valgus descending stairs .  TODAY'S TREATMENT:                                                                                                                              DATE: 12/24/2022    Manual therapy Seated STM L plantar foot and heel to decrease fascial restrictions   Therapeutic exercise  Hooklying  posterior pelvic tilt 10x10 seconds for 2 sets  SLR hip flexion L 10x, then 5x (for L LE neural flossing)  Crunch 10x2  Standing with contralateral UE assist   Mini lunge    R 10x3   L 10x3  Standing forward weight shift onto forefeet, heels on floor 10x10 seconds for 2 sets with B UE assist PRN  Single leg dead lift with contralateral UE assist   L 10x3  Reviewed standing gastroc stretch at stair step with PT with B UE assist per pt request. Pt demonstrated and verbalized understanding.     Improved exercise technique, movement at target joints, use of target muscles after mod verbal, visual, tactile cues.      PATIENT EDUCATION:  Education details: POC Person educated: Patient Education method: Explanation Education comprehension: verbalized understanding  HOME EXERCISE PROGRAM: Access Code: [email or web address] URL: [email or web address] Date: 12/04/2022 Prepared by: Loralyn Freshwater  Exercises - Heel Raises with Counter Support  - 1 x daily - 7 x weekly - 3 sets - 10 reps - Supine Posterior Pelvic Tilt  - 1 x daily - 7 x weekly - 3 sets - 10 reps - 5 seconds hold - Standing Toe Dorsiflexion Stretch  - 2 x daily - 7 x weekly - 1 sets - 3 reps - 1 minute hold - Curl Up with Arms Crossed  - 1 x daily - 7 x weekly - 2 sets - 10 reps    ASSESSMENT:  CLINICAL IMPRESSION: Continued working on improving L plantar foot and  heel fascial mobility as well as decreasing extension stress to low back to decrease pressure to L5 nerves to foot. Pt tolerated session well without aggravation of symptoms. Pt will benefit from continued skilled physical therapy services to decrease pain, improve strength and function.      OBJECTIVE IMPAIRMENTS: Abnormal gait, difficulty walking, improper body mechanics, postural dysfunction, and pain.   ACTIVITY LIMITATIONS: locomotion level  PARTICIPATION LIMITATIONS:   PERSONAL FACTORS: Age, Fitness, Time since onset of injury/illness/exacerbation, and 3+ comorbidities: cancer, HTN, osteopenia  are also affecting patient's functional outcome.   REHAB POTENTIAL: Fair  CLINICAL DECISION MAKING: Stable/uncomplicated  EVALUATION COMPLEXITY: Low   GOALS: Goals reviewed with patient? Yes  SHORT TERM GOALS: Target date: 12/21/2022   Pt will be independent with her initial HEP to decrease pain, improve strength, function, and ability to ambulate as well as wear dress shoes more comfortably for her foot.  Baseline Pt has not yet started her HEP (11/28/2022) Goal status: INITIAL     LONG TERM GOALS: Target date: 01/25/2023  Pt will have a decrease in L foot pain to 3/10 or less at worst to promote ability to ambulate more comfortably for her foot.  Baseline: 8/10 at worst for the past 3 months (11/28/2022) Goal status: INITIAL  2.  Pt will improve her FOTO score by at least 10 points as a demonstration of improved function.  Baseline: L foot FOTO 56 (11/28/2022) Goal status: INITIAL  3.  Pt will improve her L hip extension and abduction strength to promote femoral control during standing tasks as well as with stair negotiation to decrease plantar pressure to her L foot.  Baseline:  MMT Right eval Left eval  Hip extension 4 4  Hip abduction 4 4  (11/28/2022) Goal status: INITIAL  PLAN:  PT FREQUENCY: 2x/week  PT DURATION: 8 weeks  PLANNED INTERVENTIONS: Therapeutic  exercises, Therapeutic activity, Neuromuscular re-education, Balance training, Gait training, Patient/Family education, Joint mobilization, Stair training, Aquatic Therapy, Dry Needling, Electrical stimulation, Ionotophoresis 4mg /ml Dexamethasone, Manual therapy, and Re-evaluation.  PLAN FOR NEXT SESSION: posture, glute med, max strengthening, femoral control, isometric, concentric, eccentric loading to Achilles, manual techniques, modalities PRN   Nana Hoselton, PT, DPT 12/24/2022, 12:17 PM

## 2022-12-26 ENCOUNTER — Ambulatory Visit: Payer: Medicare HMO

## 2022-12-26 DIAGNOSIS — M25572 Pain in left ankle and joints of left foot: Secondary | ICD-10-CM

## 2022-12-26 DIAGNOSIS — R262 Difficulty in walking, not elsewhere classified: Secondary | ICD-10-CM

## 2022-12-26 DIAGNOSIS — M79672 Pain in left foot: Secondary | ICD-10-CM

## 2022-12-26 NOTE — Therapy (Signed)
OUTPATIENT PHYSICAL THERAPY Treatment    Patient Name: Terri Melendez MRN: [id number] DOB:[birth year], 72 y.o., female Today's Date: 12/26/2022  END OF SESSION:  PT End of Session - 12/26/22 0905     Visit Number 7    Number of Visits 17    Date for PT Re-Evaluation 01/25/23    PT Start Time 0905    PT Stop Time 0944    PT Time Calculation (min) 39 min    Activity Tolerance Patient tolerated treatment well    Behavior During Therapy [facility] for tasks assessed/performed                   Past Medical History:  Diagnosis Date   Cancer (HCC)    basal cell on face in 2000 or 2001   Hx of basal cell carcinoma 2002   right cheek    Hypertension    Osteopenia    Sinus congestion    Past Surgical History:  Procedure Laterality Date   BASAL CELL CARCINOMA EXCISION     TONSILLECTOMY AND ADENOIDECTOMY     Patient Active Problem List   Diagnosis Date Noted   Plantar fasciitis of left foot 03/28/2022   Anemia 01/10/2022   Allergic rhinitis 06/17/2020   GERD with esophagitis 06/17/2020   Personal history of other malignant neoplasm of skin 06/03/2020   Polyarthralgia 12/15/2019   Thoracic aortic atherosclerosis (HCC) 12/15/2019   Coronary atherosclerosis due to calcified coronary lesion (CODE) 12/15/2019   Encounter for preventive health examination 12/13/2018   History of shingles 05/04/2018   Non-functional thyroid nodule 05/04/2018   Essential hypertension 10/22/2017   Advanced care planning/counseling discussion 10/22/2017   Hypercholesteremia 05/14/2016   OSA on CPAP 10/13/2014    PCP:   Sherlene Shams, MD    REFERRING PROVIDER: Elinor Parkinson, DPM  REFERRING DIAG: M72.2 (ICD-10-CM) - Plantar fasciitis of left foot  Rationale for Evaluation and Treatment: Rehabilitation  THERAPY DIAG:  Pain in left foot  Pain in left ankle and joints of left foot  Difficulty in walking, not elsewhere classified  ONSET DATE: end of January 2024  SUBJECTIVE:                                                                                                                                                                                            SUBJECTIVE STATEMENT: L foot feels about a 2/10 currently. Tried wearing elevated heels yesterday and had to stop because her toes were getting crowded in the front, not the foot pain. Going to [place] for her cruise tomorrow.       PERTINENT HISTORY:  L foot plantar fasciitis. Pain began at the end of January 2024, sudden onset. Pt was driving to [facility] for 5 hours. Wore clogs. The foot at an angle (IV) for 5 hours as well as frequently got up and down from a high truck during the trip might have caused her pain, since her L foot pain began a few days after her trip. Pain worsened initially. Had x-ray which did not reveal any fractures. Was diagnosed with plantar fasciitis. Was given exercises such as seated (on the floor) ankle DF stretch with towel, as well as stretching her plantar fascia with foot on a step, as well as rolling her foot over a frozen water bottle which did not really make her foot much better. Had swelling and made an appontment with Dr. Al Corpus since February. Has had cortisone shots. Has hints of R foot plantar fasciitis as well. L foot pain gets better, then worse, then better, then worse. Wears 8.5 size shoes and got size 9 shoes with inserts. Currently has an appointment to get orthotics professionally made.    No latex allergies HTN is controlled with medicine per pt.   PAIN:  Are you having pain? Yes: NPRS scale: 0/10 Pain location: L heel and posterior tibialis tendon and plantar fascia (at times), and L medial and lateral malleoli posteriorly.  Pain description: half size larger shoe with arch supports Aggravating factors: wearing dress shoes, walking on high heels, possibly prolonged walking  Relieving factors: larger shoe size with arch support  PRECAUTIONS: Other:  Osteopenia  RED FLAGS: Bowel or bladder incontinence: No and Cauda equina syndrome: No   WEIGHT BEARING RESTRICTIONS: No  FALLS:  Has patient fallen in last 6 months? No  LIVING ENVIRONMENT: Lives with: lives with their spouse Lives in: House/apartment Stairs: Yes: Internal: 12 steps; on right going up Has following equipment at home: None  OCCUPATION: retired  PLOF: Independent  PATIENT GOALS: Be able to wear dress shoes, not necessarily heels, but something that is not a tennis shoe.   NEXT MD VISIT: None scheduled yet  OBJECTIVE:  Note: Objective measures were completed at Evaluation unless otherwise noted.  DIAGNOSTIC FINDINGS:    PATIENT SURVEYS:  FOTO L foot FOTO 56 (11/28/2022)  SCREENING FOR RED FLAGS: Bowel or bladder incontinence: No Cauda equina syndrome: No   COGNITION: Overall cognitive status: Within functional limits for tasks assessed     SENSATION:   MUSCLE LENGTH:   POSTURE:  Slight pronation B feet.   PALPATION: TTP L planter foot and first and 2nd digit toe flexor muscles Decrease fascial mobility L heel   LUMBAR ROM:   AROM 12/04/2022  Flexion WFL  Extension limited  Right lateral flexion limited  Left lateral flexion limited  Right rotation WFL  Left rotation WFL   (Blank rows = not tested)  LOWER EXTREMITY ROM:     Active  Right eval Left eval  Hip flexion    Hip extension    Hip abduction    Hip adduction    Hip internal rotation    Hip external rotation    Knee flexion    Knee extension    Ankle dorsiflexion    Ankle plantarflexion    Ankle inversion    Ankle eversion     (Blank rows = not tested)  LOWER EXTREMITY MMT:    MMT Right eval Left eval  Hip flexion 4- 4-  Hip extension 4 4  Hip abduction 4 4  Hip adduction  Hip internal rotation    Hip external rotation 4 4  Knee flexion 5 5  Knee extension 5 5  Ankle dorsiflexion (manually resisted) 4 4  Ankle plantarflexion (manually resisted) 4+ 4+   Ankle inversion (manually resisted) 4 4-  Ankle eversion (manually resisted) 4+ 4   (Blank rows = not tested)  LUMBAR SPECIAL TESTS:  (-) Slump L LE  FUNCTIONAL TESTS:    GAIT: Distance walked: 50 ft Assistive device utilized: None Level of assistance: Complete Independence Comments: antalgic, decreased stance L LE Ascending and descending 4 regular step with R UE assist   R and L genu valgus descending stairs .  TODAY'S TREATMENT:                                                                                                                              DATE: 12/26/2022    Therapeutic exercise   Standing posterior pelvic tilt 10x3 with 5 second holds  Slight decrease in L plantar foot pain afterwards   Standing heel toe raises with B UE assist 10x3   Standing with contralateral UE assist   Mini lunge    R 10x3   L 10x3      Decreased L plantar foot pain afterwards   Seated LE neural flossing  R 10x3  L 10x3   Hooklying  posterior pelvic tilt 10x10 seconds for 2 sets  SLR hip flexion L 10x, then 5x (for L LE neural flossing)  Crunch 10x2   Single leg dead lift with contralateral UE assist   L 10x2      Improved exercise technique, movement at target joints, use of target muscles after mod verbal, visual, tactile cues.      PATIENT EDUCATION:  Education details: POC Person educated: Patient Education method: Explanation Education comprehension: verbalized understanding  HOME EXERCISE PROGRAM: Access Code: [email or web address] URL: [email or web address] Date: 12/04/2022 Prepared by: Loralyn Freshwater  Exercises - Heel Raises with Counter Support  - 1 x daily - 7 x weekly - 3 sets - 10 reps - Supine Posterior Pelvic Tilt  - 1 x daily - 7 x weekly - 3 sets - 10 reps - 5 seconds hold - Standing Toe Dorsiflexion Stretch  - 2 x daily - 7 x weekly - 1 sets - 3 reps - 1 minute hold - Curl Up with Arms Crossed  - 1 x daily - 7 x weekly - 2 sets -  10 reps    ASSESSMENT:  CLINICAL IMPRESSION:   Continued working on decreasing extension stress to low back to decrease pressure to L5 nerves to foot. Pt tolerated session well without aggravation of symptoms. Pt will benefit from continued skilled physical therapy services to decrease pain, improve strength and function.      OBJECTIVE IMPAIRMENTS: Abnormal gait, difficulty walking, improper body mechanics, postural dysfunction, and pain.   ACTIVITY LIMITATIONS: locomotion level  PARTICIPATION LIMITATIONS:   PERSONAL FACTORS: Age, Fitness, Time since onset  of injury/illness/exacerbation, and 3+ comorbidities: cancer, HTN, osteopenia  are also affecting patient's functional outcome.   REHAB POTENTIAL: Fair    CLINICAL DECISION MAKING: Stable/uncomplicated  EVALUATION COMPLEXITY: Low   GOALS: Goals reviewed with patient? Yes  SHORT TERM GOALS: Target date: 12/21/2022   Pt will be independent with her initial HEP to decrease pain, improve strength, function, and ability to ambulate as well as wear dress shoes more comfortably for her foot.  Baseline Pt has not yet started her HEP (11/28/2022) Goal status: INITIAL     LONG TERM GOALS: Target date: 01/25/2023  Pt will have a decrease in L foot pain to 3/10 or less at worst to promote ability to ambulate more comfortably for her foot.  Baseline: 8/10 at worst for the past 3 months (11/28/2022) Goal status: INITIAL  2.  Pt will improve her FOTO score by at least 10 points as a demonstration of improved function.  Baseline: L foot FOTO 56 (11/28/2022); FOTO 77 (12/26/2022) Goal status: Met    3.  Pt will improve her L hip extension and abduction strength to promote femoral control during standing tasks as well as with stair negotiation to decrease plantar pressure to her L foot.  Baseline:  MMT Right eval Left eval  Hip extension 4 4  Hip abduction 4 4  (11/28/2022) Goal status: INITIAL  PLAN:  PT FREQUENCY:  2x/week  PT DURATION: 8 weeks  PLANNED INTERVENTIONS: Therapeutic exercises, Therapeutic activity, Neuromuscular re-education, Balance training, Gait training, Patient/Family education, Joint mobilization, Stair training, Aquatic Therapy, Dry Needling, Electrical stimulation, Ionotophoresis 4mg /ml Dexamethasone, Manual therapy, and Re-evaluation.  PLAN FOR NEXT SESSION: posture, glute med, max strengthening, femoral control, isometric, concentric, eccentric loading to Achilles, manual techniques, modalities PRN   Mateja Dier, PT, DPT 12/26/2022, 12:08 PM

## 2023-01-14 ENCOUNTER — Ambulatory Visit: Payer: Medicare HMO | Attending: Podiatry

## 2023-01-14 ENCOUNTER — Ambulatory Visit: Payer: Medicare HMO | Admitting: Internal Medicine

## 2023-01-15 ENCOUNTER — Ambulatory Visit: Payer: Medicare HMO

## 2023-01-15 DIAGNOSIS — M722 Plantar fascial fibromatosis: Secondary | ICD-10-CM

## 2023-01-15 DIAGNOSIS — M7752 Other enthesopathy of left foot: Secondary | ICD-10-CM

## 2023-01-15 NOTE — Progress Notes (Addendum)
Patient presents today to pick up custom molded foot orthotics, diagnosed with Plantar Fasciitis  by Dr. Al Corpus .   Orthotics were dispensed and fit was satisfactory. Reviewed instructions for break-in and wear. Written instructions given to patient.  Patient will follow up as needed. Terri Melendez Cped, CFo, CFo Auth sent  for today's [employer] denied by [employer] / Candace Cruise

## 2023-01-16 ENCOUNTER — Ambulatory Visit: Payer: Medicare HMO | Attending: Podiatry

## 2023-01-16 DIAGNOSIS — R262 Difficulty in walking, not elsewhere classified: Secondary | ICD-10-CM | POA: Insufficient documentation

## 2023-01-16 DIAGNOSIS — M25572 Pain in left ankle and joints of left foot: Secondary | ICD-10-CM | POA: Insufficient documentation

## 2023-01-16 DIAGNOSIS — M79672 Pain in left foot: Secondary | ICD-10-CM | POA: Insufficient documentation

## 2023-01-16 NOTE — Therapy (Addendum)
OUTPATIENT PHYSICAL THERAPY Treatment    Patient Name: Terri Melendez MRN: [id number] DOB:Mar 20, 1950, 72 y.o., female Today's Date: 01/16/2023  END OF SESSION:  PT End of Session - 01/16/23 1122     Visit Number 8    Number of Visits 17    Date for PT Re-Evaluation 01/25/23    PT Start Time 1122    PT Stop Time 1148    PT Time Calculation (min) 26 min    Activity Tolerance Patient tolerated treatment well    Behavior During Therapy [facility] for tasks assessed/performed                    Past Medical History:  Diagnosis Date   Cancer (HCC)    basal cell on face in 2000 or 2001   Hx of basal cell carcinoma 2002   right cheek    Hypertension    Osteopenia    Sinus congestion    Past Surgical History:  Procedure Laterality Date   BASAL CELL CARCINOMA EXCISION     TONSILLECTOMY AND ADENOIDECTOMY     Patient Active Problem List   Diagnosis Date Noted   Plantar fasciitis of left foot 03/28/2022   Anemia 01/10/2022   Allergic rhinitis 06/17/2020   GERD with esophagitis 06/17/2020   Personal history of other malignant neoplasm of skin 06/03/2020   Polyarthralgia 12/15/2019   Thoracic aortic atherosclerosis (HCC) 12/15/2019   Coronary atherosclerosis due to calcified coronary lesion (CODE) 12/15/2019   Encounter for preventive health examination 12/13/2018   History of shingles 05/04/2018   Non-functional thyroid nodule 05/04/2018   Essential hypertension 10/22/2017   Advanced care planning/counseling discussion 10/22/2017   Hypercholesteremia 05/14/2016   OSA on CPAP 10/13/2014    PCP:   Sherlene Shams, MD    REFERRING PROVIDER: Elinor Parkinson, DPM  REFERRING DIAG: M72.2 (ICD-10-CM) - Plantar fasciitis of left foot  Rationale for Evaluation and Treatment: Rehabilitation  THERAPY DIAG:  Pain in left foot  Pain in left ankle and joints of left foot  Difficulty in walking, not elsewhere classified  ONSET DATE: end of January  2024  SUBJECTIVE:                                                                                                                                                                                           SUBJECTIVE STATEMENT:   Pt reports the foot is doing really well and is likely ready to be discharged.         PERTINENT HISTORY:  L foot plantar fasciitis. Pain began at the end of January 2024, sudden onset. Pt was  driving to [facility] for 5 hours. Wore clogs. The foot at an angle (IV) for 5 hours as well as frequently got up and down from a high truck during the trip might have caused her pain, since her L foot pain began a few days after her trip. Pain worsened initially. Had x-ray which did not reveal any fractures. Was diagnosed with plantar fasciitis. Was given exercises such as seated (on the floor) ankle DF stretch with towel, as well as stretching her plantar fascia with foot on a step, as well as rolling her foot over a frozen water bottle which did not really make her foot much better. Had swelling and made an appontment with Dr. Al Corpus since February. Has had cortisone shots. Has hints of R foot plantar fasciitis as well. L foot pain gets better, then worse, then better, then worse. Wears 8.5 size shoes and got size 9 shoes with inserts. Currently has an appointment to get orthotics professionally made.    No latex allergies HTN is controlled with medicine per pt.   PAIN:  Are you having pain? Yes: NPRS scale: 0/10 Pain location: L heel and posterior tibialis tendon and plantar fascia (at times), and L medial and lateral malleoli posteriorly.  Pain description: half size larger shoe with arch supports Aggravating factors: wearing dress shoes, walking on high heels, possibly prolonged walking  Relieving factors: larger shoe size with arch support  PRECAUTIONS: Other: Osteopenia  RED FLAGS: Bowel or bladder incontinence: No and Cauda equina syndrome: No   WEIGHT BEARING  RESTRICTIONS: No  FALLS:  Has patient fallen in last 6 months? No  LIVING ENVIRONMENT: Lives with: lives with their spouse Lives in: House/apartment Stairs: Yes: Internal: 12 steps; on right going up Has following equipment at home: None  OCCUPATION: retired  PLOF: Independent  PATIENT GOALS: Be able to wear dress shoes, not necessarily heels, but something that is not a tennis shoe.   NEXT MD VISIT: None scheduled yet  OBJECTIVE:  Note: Objective measures were completed at Evaluation unless otherwise noted.  DIAGNOSTIC FINDINGS:    PATIENT SURVEYS:  FOTO L foot FOTO 56 (11/28/2022)  SCREENING FOR RED FLAGS: Bowel or bladder incontinence: No Cauda equina syndrome: No   COGNITION: Overall cognitive status: Within functional limits for tasks assessed     SENSATION:   MUSCLE LENGTH:   POSTURE:  Slight pronation B feet.   PALPATION: TTP L planter foot and first and 2nd digit toe flexor muscles Decrease fascial mobility L heel   LUMBAR ROM:   AROM 12/04/2022  Flexion WFL  Extension limited  Right lateral flexion limited  Left lateral flexion limited  Right rotation WFL  Left rotation WFL   (Blank rows = not tested)  LOWER EXTREMITY ROM:     Active  Right eval Left eval  Hip flexion    Hip extension    Hip abduction    Hip adduction    Hip internal rotation    Hip external rotation    Knee flexion    Knee extension    Ankle dorsiflexion    Ankle plantarflexion    Ankle inversion    Ankle eversion     (Blank rows = not tested)  LOWER EXTREMITY MMT:    MMT Right eval Left eval  Hip flexion 4- 4-  Hip extension 4 4  Hip abduction 4 4  Hip adduction    Hip internal rotation    Hip external rotation 4 4  Knee flexion 5  5  Knee extension 5 5  Ankle dorsiflexion (manually resisted) 4 4  Ankle plantarflexion (manually resisted) 4+ 4+  Ankle inversion (manually resisted) 4 4-  Ankle eversion (manually resisted) 4+ 4   (Blank rows = not  tested)  LUMBAR SPECIAL TESTS:  (-) Slump L LE  FUNCTIONAL TESTS:    GAIT: Distance walked: 50 ft Assistive device utilized: None Level of assistance: Complete Independence Comments: antalgic, decreased stance L LE Ascending and descending 4 regular step with R UE assist   R and L genu valgus descending stairs .  TODAY'S TREATMENT: DATE: 01/16/23     TherEx:   Standing weight shift forward/backward on Theraband board, 30 sec bouts x2  Goal assessment performed and noted below  Improved exercise technique, movement at target joints, use of target muscles after mod verbal, visual, tactile cues.      PATIENT EDUCATION:  Education details: POC Person educated: Patient Education method: Explanation Education comprehension: verbalized understanding  HOME EXERCISE PROGRAM: Access Code: [email or web address] URL: [email or web address] Date: 12/04/2022 Prepared by: Loralyn Freshwater  Exercises - Heel Raises with Counter Support  - 1 x daily - 7 x weekly - 3 sets - 10 reps - Supine Posterior Pelvic Tilt  - 1 x daily - 7 x weekly - 3 sets - 10 reps - 5 seconds hold - Standing Toe Dorsiflexion Stretch  - 2 x daily - 7 x weekly - 1 sets - 3 reps - 1 minute hold - Curl Up with Arms Crossed  - 1 x daily - 7 x weekly - 2 sets - 10 reps    ASSESSMENT:  CLINICAL IMPRESSION:  Pt assessed for goals and potential discharge.  Pt has made significant progress towards goals and is noting a complete reduction in overall pain at this moment in time.  Pt self repots worst pain over past 7 days to be 1-2/10, with pt's only concern is riding in car to [place].  Otherwise, pt is ready to be discharged from the clinic.  Pt advised to contact the clinic if any needs arise.       OBJECTIVE IMPAIRMENTS: Abnormal gait, difficulty walking, improper body mechanics, postural dysfunction, and pain.   ACTIVITY LIMITATIONS: locomotion level  PARTICIPATION LIMITATIONS:   PERSONAL FACTORS: Age,  Fitness, Time since onset of injury/illness/exacerbation, and 3+ comorbidities: cancer, HTN, osteopenia  are also affecting patient's functional outcome.   REHAB POTENTIAL: Fair    CLINICAL DECISION MAKING: Stable/uncomplicated  EVALUATION COMPLEXITY: Low   GOALS: Goals reviewed with patient? Yes  SHORT TERM GOALS: Target date: 12/21/2022   Pt will be independent with her initial HEP to decrease pain, improve strength, function, and ability to ambulate as well as wear dress shoes more comfortably for her foot.  Baseline Pt has not yet started her HEP (11/28/2022) Goal status: INITIAL     LONG TERM GOALS: Target date: 01/25/2023  Pt will have a decrease in L foot pain to 3/10 or less at worst to promote ability to ambulate more comfortably for her foot.  Baseline: 8/10 at worst for the past 3 months (11/28/2022) 01/16/23: 1-2/10 worst pain over past week Goal status: MET  2.  Pt will improve her FOTO score by at least 10 points as a demonstration of improved function.  Baseline: L foot FOTO 56 (11/28/2022); FOTO 77 (12/26/2022) 01/16/23: 87 Goal status: MET    3.  Pt will improve her L hip extension and abduction strength to promote femoral control during standing tasks as well as  with stair negotiation to decrease plantar pressure to her L foot.  Baseline:  MMT Right eval Left eval  Hip extension 4 4  Hip abduction 4 4  (11/28/2022)  01/16/23: Pt has 5/5 hip abduction strength Goal status: MET  PLAN:  PT FREQUENCY: 2x/week  PT DURATION: 8 weeks  PLANNED INTERVENTIONS: Therapeutic exercises, Therapeutic activity, Neuromuscular re-education, Balance training, Gait training, Patient/Family education, Joint mobilization, Stair training, Aquatic Therapy, Dry Needling, Electrical stimulation, Ionotophoresis 4mg /ml Dexamethasone, Manual therapy, and Re-evaluation.  PLAN FOR NEXT SESSION: posture, glute med, max strengthening, femoral control, isometric, concentric,  eccentric loading to Achilles, manual techniques, modalities PRN   Nolon Bussing, PT, DPT Physical Therapist - [facility]  01/16/23, 12:09 PM

## 2023-01-18 ENCOUNTER — Ambulatory Visit: Payer: Medicare HMO

## 2023-01-29 ENCOUNTER — Encounter: Payer: Self-pay | Admitting: Internal Medicine

## 2023-01-29 ENCOUNTER — Ambulatory Visit (INDEPENDENT_AMBULATORY_CARE_PROVIDER_SITE_OTHER): Payer: Medicare HMO | Admitting: Internal Medicine

## 2023-01-29 VITALS — BP 124/64 | HR 78 | Ht 64.0 in | Wt 142.6 lb

## 2023-01-29 DIAGNOSIS — R7301 Impaired fasting glucose: Secondary | ICD-10-CM | POA: Diagnosis not present

## 2023-01-29 DIAGNOSIS — E7800 Pure hypercholesterolemia, unspecified: Secondary | ICD-10-CM | POA: Diagnosis not present

## 2023-01-29 DIAGNOSIS — I10 Essential (primary) hypertension: Secondary | ICD-10-CM

## 2023-01-29 DIAGNOSIS — D649 Anemia, unspecified: Secondary | ICD-10-CM | POA: Diagnosis not present

## 2023-01-29 DIAGNOSIS — Z87892 Personal history of anaphylaxis: Secondary | ICD-10-CM | POA: Insufficient documentation

## 2023-01-29 DIAGNOSIS — E041 Nontoxic single thyroid nodule: Secondary | ICD-10-CM

## 2023-01-29 DIAGNOSIS — Z0000 Encounter for general adult medical examination without abnormal findings: Secondary | ICD-10-CM

## 2023-01-29 DIAGNOSIS — D508 Other iron deficiency anemias: Secondary | ICD-10-CM

## 2023-01-29 DIAGNOSIS — R5383 Other fatigue: Secondary | ICD-10-CM

## 2023-01-29 DIAGNOSIS — I700 Atherosclerosis of aorta: Secondary | ICD-10-CM

## 2023-01-29 LAB — B12 AND FOLATE PANEL
Folate: >24.2 ng/mL (ref >5.9)
Vitamin B-12: 338 pg/mL (ref 211–911)

## 2023-01-29 LAB — CBC WITH DIFFERENTIAL/PLATELET
Basophils Absolute: 0 10*3/uL (ref 0.0–0.1)
Basophils Relative: 0.6 % (ref 0.0–3.0)
Eosinophils Absolute: 0.1 10*3/uL (ref 0.0–0.7)
Eosinophils Relative: 2.3 % (ref 0.0–5.0)
HCT: 34.5 % — ABNORMAL LOW (ref 36.0–46.0)
Hemoglobin: 11.6 g/dL — ABNORMAL LOW (ref 12.0–15.0)
Lymphocytes Relative: 22.1 % (ref 12.0–46.0)
Lymphs Abs: 1.4 10*3/uL (ref 0.7–4.0)
MCHC: 33.5 g/dL (ref 30.0–36.0)
MCV: 90.3 fL (ref 78.0–100.0)
Monocytes Absolute: 0.6 10*3/uL (ref 0.1–1.0)
Monocytes Relative: 10 % (ref 3.0–12.0)
Neutro Abs: 4.1 10*3/uL (ref 1.4–7.7)
Neutrophils Relative %: 65 % (ref 43.0–77.0)
Platelets: 247 10*3/uL (ref 150.0–400.0)
RBC: 3.81 Mil/uL — ABNORMAL LOW (ref 3.87–5.11)
RDW: 13.6 % (ref 11.5–15.5)
WBC: 6.3 10*3/uL (ref 4.0–10.5)

## 2023-01-29 LAB — COMPREHENSIVE METABOLIC PANEL
ALT: 13 U/L (ref 0–35)
AST: 17 U/L (ref 0–37)
Albumin: 4 g/dL (ref 3.5–5.2)
Alkaline Phosphatase: 85 U/L (ref 39–117)
BUN: 23 mg/dL (ref 6–23)
CO2: 31 meq/L (ref 19–32)
Calcium: 9.2 mg/dL (ref 8.4–10.5)
Chloride: 106 meq/L (ref 96–112)
Creatinine, Ser: 0.87 mg/dL (ref 0.40–1.20)
GFR: 67.53 mL/min (ref >60.00)
Glucose, Bld: 69 mg/dL — ABNORMAL LOW (ref 70–99)
Potassium: 3.8 meq/L (ref 3.5–5.1)
Sodium: 141 meq/L (ref 135–145)
Total Bilirubin: 0.6 mg/dL (ref 0.2–1.2)
Total Protein: 6.4 g/dL (ref 6.0–8.3)

## 2023-01-29 LAB — LIPID PANEL
Cholesterol: 158 mg/dL (ref 0–200)
HDL: 53.2 mg/dL (ref >39.00)
LDL Cholesterol: 90 mg/dL (ref 0–99)
NonHDL: 105.12
Total CHOL/HDL Ratio: 3
Triglycerides: 76 mg/dL (ref 0.0–149.0)
VLDL: 15.2 mg/dL (ref 0.0–40.0)

## 2023-01-29 LAB — TSH: TSH: 0.76 u[IU]/mL (ref 0.35–5.50)

## 2023-01-29 LAB — LDL CHOLESTEROL, DIRECT: Direct LDL: 83 mg/dL

## 2023-01-29 LAB — HEMOGLOBIN A1C: Hgb A1c MFr Bld: 5.8 % (ref 4.6–6.5)

## 2023-01-29 MED ORDER — TELMISARTAN-HCTZ 40-12.5 MG PO TABS: 1.0000 | ORAL_TABLET | Freq: Every day | ORAL | 1 refills | Status: DC

## 2023-01-29 NOTE — Patient Instructions (Signed)
I have combined your telmisartan and hydrochlorothiazide into one pill to save your $$$$  Suspend your telmisartan/hct  during   a diarrheal illness until you are back to normal (to avoid dehydration)  Use a heavy moisturized (Cerave , Cetaphil  Eucerin,  etc at night on your fingers and cover them with gloves to enhance absorption /coverage

## 2023-01-29 NOTE — Assessment & Plan Note (Signed)

## 2023-01-29 NOTE — Assessment & Plan Note (Signed)
Well controlled on current regimen of telmisartan and hctz. , no changes today except t combines meds to save copay

## 2023-01-29 NOTE — Assessment & Plan Note (Signed)
Incidental finding during prior ER workup for chest pain.  Followed with annual [place] by Erline Hau.  Thyroid function HAS BEEN  normal.   Lab Results  Component Value Date   TSH 0.91 01/10/2022

## 2023-01-29 NOTE — Progress Notes (Signed)
Patient ID: Terri Melendez, female    DOB: 1950/05/30  Age: 72 y.o. MRN: [id number]  The patient is here for annual preventive examination and management of other chronic and acute problems.   The risk factors are reflected in the social history.  The roster of all physicians providing medical care to patient - is listed in the Snapshot section of the chart.  Activities of daily living:  The patient is 100% independent in all ADLs: dressing, toileting, feeding as well as independent mobility  Home safety : The patient has smoke detectors in the home. They wear seatbelts.  There are no firearms at home. There is no violence in the home.   There is no risks for hepatitis, STDs or HIV. There is no   history of blood transfusion. They have no travel history to infectious disease endemic areas of the world.  The patient has seen their dentist in the last six month. They have seen their eye doctor in the last year. They admit to slight hearing difficulty with regard to whispered voices and some television programs.  They have deferred audiologic testing in the last year.  They do not  have excessive sun exposure. Discussed the need for sun protection: hats, long sleeves and use of sunscreen if there is significant sun exposure.   Diet: the importance of a healthy diet is discussed. They do have a healthy diet.  The benefits of regular aerobic exercise were discussed. She walks 4 times per week ,  20 minutes.   Depression screen: there are no signs or vegative symptoms of depression- irritability, change in appetite, anhedonia, sadness/tearfullness.  Cognitive assessment: the patient manages all their financial and personal affairs and is actively engaged. They could relate day,date,year and events; recalled 2/3 objects at 3 minutes; performed clock-face test normally.  The following portions of the patient's history were reviewed and updated as appropriate: allergies, current medications, past  family history, past medical history,  past surgical history, past social history  and problem list.  Visual acuity was not assessed per patient preference since she has regular follow up with her ophthalmologist. Hearing and body mass index were assessed and reviewed.   During the course of the visit the patient was educated and counseled about appropriate screening and preventive services including : fall prevention , diabetes screening, nutrition counseling, colorectal cancer screening, and recommended immunizations.    CC: The primary encounter diagnosis was Essential hypertension. Diagnoses of Hypercholesteremia, Impaired fasting glucose, Other fatigue, Anemia, unspecified type, Thoracic aortic atherosclerosis (HCC), Non-functional thyroid nodule, and Encounter for preventive health examination were also pertinent to this visit.   Plantar fasciitis; now managed wit PT and custom orthotics after injections failed.  Has completed PT  symptoms improving.    History Terri Melendez has a past medical history of Cancer (HCC), basal cell carcinoma (2002), Hypertension, Osteopenia, and Sinus congestion.   She has a past surgical history that includes Tonsillectomy and adenoidectomy and Excision basal cell carcinoma.   Her family history includes Breast cancer in her maternal aunt; Cancer in her mother.She reports that she has never smoked. She has never used smokeless tobacco. She reports current alcohol use. She reports that she does not use drugs.  Outpatient Medications Prior to Visit  Medication Sig Dispense Refill   atorvastatin (LIPITOR) 20 MG tablet TAKE 1 TABLET BY MOUTH DAILY 90 tablet 3   cetirizine (ZYRTEC) 10 MG tablet Take 1 tablet (10 mg total) by mouth daily. 90 tablet 1  cholecalciferol (VITAMIN D3) 25 MCG (1000 UNIT) tablet Take 1,000 Units by mouth daily.     DENTAGEL 1.1 % GEL dental gel      meloxicam (MOBIC) 15 MG tablet Take 1 tablet (15 mg total) by mouth daily. 30 tablet 3    mometasone (NASONEX) 50 MCG/ACT nasal spray Place 2 sprays into the nose daily.     hydrochlorothiazide (MICROZIDE) 12.5 MG capsule Take 1 capsule (12.5 mg total) by mouth daily. 90 capsule 1   telmisartan (MICARDIS) 40 MG tablet Take 1 tablet (40 mg total) by mouth daily. 90 tablet 1   Cholecalciferol 1.25 MG (50000 UT) capsule Take 1 capsule (50,000 Units total) by mouth once a week. (Patient not taking: Reported on 10/10/2022) 4 capsule 0   metroNIDAZOLE (METROCREAM) 0.75 % cream Apply topically 1-2 times daily to nose for rosacea. (Patient not taking: Reported on 01/29/2023) 45 g 0   mometasone (ELOCON) 0.1 % lotion Apply topically. (Patient not taking: Reported on 10/10/2022)     No facility-administered medications prior to visit.    Review of Systems  Patient denies headache, fevers, malaise, unintentional weight loss, skin rash, eye pain, sinus congestion and sinus pain, sore throat, dysphagia,  hemoptysis , cough, dyspnea, wheezing, chest pain, palpitations, orthopnea, edema, abdominal pain, nausea, melena, diarrhea, constipation, flank pain, dysuria, hematuria, urinary  Frequency, nocturia, numbness, tingling, seizures,  Focal weakness, Loss of consciousness,  Tremor, insomnia, depression, anxiety, and suicidal ideation.     Objective:  BP 124/64   Pulse 78   Ht 5\' 4"  (1.626 m)   Wt 142 lb 9.6 oz (64.7 kg)   SpO2 99%   BMI 24.48 kg/m   Physical Exam Vitals reviewed.  Constitutional:      General: She is not in acute distress.    Appearance: Normal appearance. She is normal weight. She is not ill-appearing, toxic-appearing or diaphoretic.  HENT:     Head: Normocephalic.  Eyes:     General: No scleral icterus.       Right eye: No discharge.        Left eye: No discharge.     Conjunctiva/sclera: Conjunctivae normal.  Cardiovascular:     Rate and Rhythm: Normal rate and regular rhythm.     Heart sounds: Normal heart sounds.  Pulmonary:     Effort: Pulmonary effort is  normal. No respiratory distress.     Breath sounds: Normal breath sounds.  Musculoskeletal:        General: Normal range of motion.  Skin:    General: Skin is warm and dry.  Neurological:     General: No focal deficit present.     Mental Status: She is alert and oriented to person, place, and time. Mental status is at baseline.  Psychiatric:        Mood and Affect: Mood normal.        Behavior: Behavior normal.        Thought Content: Thought content normal.        Judgment: Judgment normal.      Assessment & Plan:  Essential hypertension Assessment & Plan: Well controlled on current regimen of telmisartan and hctz. , no changes today except t combines meds to save copay  Orders: -     3D Screening Mammogram, Left and Right; Future -     Comprehensive metabolic panel  Hypercholesteremia -     Lipid panel -     LDL cholesterol, direct  Impaired fasting glucose -  Comprehensive metabolic panel -     Hemoglobin A1c  Other fatigue -     TSH -     CBC with Differential/Platelet  Anemia, unspecified type -     CBC with Differential/Platelet -     B12 and Folate Panel  Thoracic aortic atherosclerosis [facility]) Assessment & Plan: Noted on CT in 2022.  She is tolerating high potency Statin therapy i   Non-functional thyroid nodule Assessment & Plan: Incidental finding during prior ER workup for chest pain.  Followed with annual [place] by Erline Hau.  Thyroid function HAS BEEN  normal.   Lab Results  Component Value Date   TSH 0.91 01/10/2022      Encounter for preventive health examination Assessment & Plan: age appropriate education and counseling updated, referrals for preventative services and immunizations addressed, dietary and smoking counseling addressed, most recent labs reviewed.  I have personally reviewed and have noted:   1) the patient's medical and social history 2) The pt's use of alcohol, tobacco, and illicit drugs 3) The patient's current  medications and supplements 4) Functional ability including ADL's, fall risk, home safety risk, hearing and visual impairment 5) Diet and physical activities 6) Evidence for depression or mood disorder 7) The patient's height, weight, and BMI have been recorded in the chart   I have made referrals, and provided counseling and education based on review of the above    Other orders -     Telmisartan-HCTZ; Take 1 tablet by mouth daily.  Dispense: 90 tablet; Refill: 1      I provided 40 minutes of  face-to-face time during this encounter reviewing patient's current problems and past surgeries,  recent labs and imaging studies, providing counseling on the above mentioned problems , and coordination  of care .   Follow-up: Return in about 6 months (around 07/30/2023).   Sherlene Shams, MD

## 2023-01-29 NOTE — Assessment & Plan Note (Signed)
Noted on CT in 2022.  She is tolerating high potency Statin therapy i ?

## 2023-03-04 ENCOUNTER — Other Ambulatory Visit: Payer: Self-pay | Admitting: Internal Medicine

## 2023-03-12 ENCOUNTER — Encounter: Payer: Self-pay | Admitting: Dermatology

## 2023-03-12 ENCOUNTER — Other Ambulatory Visit: Payer: Self-pay | Admitting: Dermatology

## 2023-03-12 ENCOUNTER — Ambulatory Visit: Payer: Medicare HMO | Admitting: Dermatology

## 2023-03-12 DIAGNOSIS — R238 Other skin changes: Secondary | ICD-10-CM

## 2023-03-12 DIAGNOSIS — L814 Other melanin hyperpigmentation: Secondary | ICD-10-CM | POA: Diagnosis not present

## 2023-03-12 DIAGNOSIS — L240 Irritant contact dermatitis due to detergents: Secondary | ICD-10-CM

## 2023-03-12 DIAGNOSIS — D0439 Carcinoma in situ of skin of other parts of face: Secondary | ICD-10-CM | POA: Diagnosis not present

## 2023-03-12 DIAGNOSIS — L578 Other skin changes due to chronic exposure to nonionizing radiation: Secondary | ICD-10-CM | POA: Diagnosis not present

## 2023-03-12 DIAGNOSIS — D492 Neoplasm of unspecified behavior of bone, soft tissue, and skin: Secondary | ICD-10-CM

## 2023-03-12 DIAGNOSIS — L821 Other seborrheic keratosis: Secondary | ICD-10-CM

## 2023-03-12 DIAGNOSIS — L720 Epidermal cyst: Secondary | ICD-10-CM

## 2023-03-12 DIAGNOSIS — Z872 Personal history of diseases of the skin and subcutaneous tissue: Secondary | ICD-10-CM

## 2023-03-12 DIAGNOSIS — Z85828 Personal history of other malignant neoplasm of skin: Secondary | ICD-10-CM

## 2023-03-12 DIAGNOSIS — D1801 Hemangioma of skin and subcutaneous tissue: Secondary | ICD-10-CM

## 2023-03-12 DIAGNOSIS — D229 Melanocytic nevi, unspecified: Secondary | ICD-10-CM

## 2023-03-12 DIAGNOSIS — W908XXA Exposure to other nonionizing radiation, initial encounter: Secondary | ICD-10-CM

## 2023-03-12 MED ORDER — CLOBETASOL PROPIONATE 0.05 % EX CREA: 1.0000 | TOPICAL_CREAM | Freq: Two times a day (BID) | CUTANEOUS | 1 refills | Status: DC

## 2023-03-12 NOTE — Progress Notes (Signed)
 Follow-Up Visit   Subjective  Terri Melendez is a 73 y.o. female who presents for the following: Skin Cancer Screening and Full Body Skin Exam, hx of BCCs, Aks, check spots nose~3-4m, no symptoms, check spot R eyebrow, ~2wks, no symptoms, check spot chest, Fissures fingers worse in winter  The patient presents for Total-Body Skin Exam (TBSE) for skin cancer screening and mole check. The patient has spots, moles and lesions to be evaluated, some may be new or changing and the patient may have concern these could be cancer.    The following portions of the chart were reviewed this encounter and updated as appropriate: medications, allergies, medical history  Review of Systems:  No other skin or systemic complaints except as noted in HPI or Assessment and Plan.  Objective  Well appearing patient in no apparent distress; mood and affect are within normal limits.  A full examination was performed including scalp, head, eyes, ears, nose, lips, neck, chest, axillae, abdomen, back, buttocks, bilateral upper extremities, bilateral lower extremities, hands, feet, fingers, toes, fingernails, and toenails. All findings within normal limits unless otherwise noted below.   Relevant physical exam findings are noted in the Assessment and Plan.  Nose 3.40mm pink scaly pap   Assessment & Plan   SKIN CANCER SCREENING PERFORMED TODAY.  ACTINIC DAMAGE - Chronic condition, secondary to cumulative UV/sun exposure - diffuse scaly erythematous macules with underlying dyspigmentation - Recommend daily broad spectrum sunscreen SPF 30+ to sun-exposed areas, reapply every 2 hours as needed.  - Staying in the shade or wearing long sleeves, sun glasses (UVA+UVB protection) and wide brim hats (4-inch brim around the entire circumference of the hat) are also recommended for sun protection.  - Call for new or changing lesions.  LENTIGINES, SEBORRHEIC KERATOSES, HEMANGIOMAS - Benign normal skin lesions -  Benign-appearing - Call for any changes   MELANOCYTIC NEVI - Tan-brown and/or pink-flesh-colored symmetric macules and papules - Benign appearing on exam today - Observation - Call clinic for new or changing moles - Recommend daily use of broad spectrum spf 30+ sunscreen to sun-exposed areas.   HISTORY OF BASAL CELL CARCINOMA OF THE SKIN - No evidence of recurrence today - Recommend regular full body skin exams - Recommend daily broad spectrum sunscreen SPF 30+ to sun-exposed areas, reapply every 2 hours as needed.  - Call if any new or changing lesions are noted between office visits  -R cheek, glabella  HISTORY OF PRECANCEROUS ACTINIC KERATOSIS - site(s) of PreCancerous Actinic Keratosis clear today. - these may recur and new lesions may form requiring treatment to prevent transformation into skin cancer - observe for new or changing spots and contact [facility] for appointment if occur - photoprotection with sun protective clothing; sunglasses and broad spectrum sunscreen with SPF of at least 30 + and frequent self skin exams recommended - yearly exams by a dermatologist recommended for persons with history of PreCancerous Actinic Keratoses   Milia Above R brow - tiny firm white papule - type of cyst - benign - sometimes these will clear with nightly OTC adapalene/Differin 0.1% gel or retinol. 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 Your physician  has selected the right strength medicine for your problem and area affected on the body. Please use your medication only as directed by your physician to prevent side effects.      NEOPLASM OF SKIN Nose Skin / nail biopsy Type of biopsy: tangential   Informed consent: discussed and consent obtained   Timeout: patient name, date of birth, surgical site, and procedure verified   Procedure prep:  Patient was prepped and draped in usual sterile fashion Prep type:  Isopropyl alcohol Anesthesia: the lesion was anesthetized in a standard fashion   Anesthetic:  1% lidocaine w/ epinephrine 1-100,000 buffered w/ 8.4% NaHCO3 Instrument used: DermaBlade   Hemostasis achieved with: pressure and aluminum chloride   Outcome: patient tolerated procedure well   Post-procedure details: sterile dressing applied and wound care instructions given   Dressing type: bandage and petrolatum   Specimen 1 - Surgical pathology Differential Diagnosis: SK vs Verruca vs AK vs SCC vs BCC  Check Margins: No 3.46mm pink scaly pap 2 pieces MULTIPLE BENIGN NEVI   LENTIGINES   SEBORRHEIC KERATOSES   ACTINIC ELASTOSIS   CHERRY ANGIOMA   IRRITANT CONTACT DERMATITIS DUE TO DETERGENT   MILIA   Return in about 1 year (around 03/11/2024) for TBSE, Hx of BCC, Hx of AKs.  I, Grayce Saunas, RMA, am acting as scribe for Boneta Sharps, MD .   Documentation: I have reviewed the above documentation for accuracy and completeness, and I agree with the above.  Boneta Sharps, MD

## 2023-03-12 NOTE — Patient Instructions (Addendum)

## 2023-03-13 LAB — DERMATOLOGY PATHOLOGY

## 2023-03-14 ENCOUNTER — Other Ambulatory Visit: Payer: Self-pay | Admitting: Internal Medicine

## 2023-03-14 ENCOUNTER — Encounter: Payer: Self-pay | Admitting: Dermatology

## 2023-03-14 ENCOUNTER — Encounter: Payer: Self-pay | Admitting: Internal Medicine

## 2023-03-14 DIAGNOSIS — D649 Anemia, unspecified: Secondary | ICD-10-CM

## 2023-03-18 ENCOUNTER — Other Ambulatory Visit: Payer: Self-pay | Admitting: Dermatology

## 2023-03-18 DIAGNOSIS — D099 Carcinoma in situ, unspecified: Secondary | ICD-10-CM

## 2023-03-18 MED ORDER — FLUOROURACIL 5 % EX CREA: TOPICAL_CREAM | CUTANEOUS | 1 refills | Status: DC

## 2023-03-21 ENCOUNTER — Ambulatory Visit: Payer: Medicare HMO | Admitting: Dermatology

## 2023-03-28 ENCOUNTER — Ambulatory Visit
Admission: RE | Admit: 2023-03-28 | Discharge: 2023-03-28 | Disposition: A | Discharge status: Home / Self Care | Payer: Medicare HMO | Source: Ambulatory Visit | Attending: Internal Medicine | Admitting: Internal Medicine

## 2023-03-28 DIAGNOSIS — Z1231 Encounter for screening mammogram for malignant neoplasm of breast: Secondary | ICD-10-CM | POA: Insufficient documentation

## 2023-03-28 DIAGNOSIS — I10 Essential (primary) hypertension: Secondary | ICD-10-CM | POA: Insufficient documentation

## 2023-04-09 ENCOUNTER — Encounter: Payer: Self-pay | Admitting: Podiatry

## 2023-04-10 NOTE — Telephone Encounter (Signed)
Patient left voicemail and sent MyChart message regarding 5FU treatment to SCC on nose. She has used medication for 7 days with little burning/irritation. Should she continue topical longer?

## 2023-04-30 ENCOUNTER — Ambulatory Visit (INDEPENDENT_AMBULATORY_CARE_PROVIDER_SITE_OTHER): Payer: Medicare HMO | Admitting: Internal Medicine

## 2023-04-30 ENCOUNTER — Encounter: Payer: Self-pay | Admitting: Internal Medicine

## 2023-04-30 ENCOUNTER — Ambulatory Visit

## 2023-04-30 VITALS — BP 138/58 | HR 82 | Ht 64.0 in | Wt 144.4 lb

## 2023-04-30 DIAGNOSIS — M7062 Trochanteric bursitis, left hip: Secondary | ICD-10-CM

## 2023-04-30 MED ORDER — MELOXICAM 15 MG PO TABS: 15.0000 mg | ORAL_TABLET | Freq: Every day | ORAL | 3 refills | Status: DC

## 2023-04-30 MED ORDER — PREDNISONE 10 MG PO TABS: ORAL_TABLET | ORAL | 0 refills | Status: DC

## 2023-04-30 NOTE — Assessment & Plan Note (Signed)
 Suggested by history and exam.  Will prescribe a 6day Prednisone taper followed by meloxicam.  Stretching exercises demonstrated and hand out given.  Plain films f left hip o rdered

## 2023-04-30 NOTE — Patient Instructions (Signed)
 I am treating you for trochanteric bursitis Prednisone taper for 6 days, followed by daily meloxicam will reduce the inflammation   Less weight bearing activities (stop the long walks) Avoid leading with the left leg up the stairs  Daily Stretching the gluteal muscles  helps reduce the pressure on the trochanteric bursa  If no improvement after 2 weeks, call for Orthopedics referral

## 2023-04-30 NOTE — Progress Notes (Unsigned)
 Subjective:  Patient ID: Terri Melendez, female    DOB: 26-Jul-1950  Age: 73 y.o. MRN: [id number]  CC: There were no encounter diagnoses.   HPI Terri Melendez presents for  Chief Complaint  Patient presents with  . Hip Pain    On and off left hip pain   Left lateral  hip pain described as a dull pain that  has been intermittent for the last  several months. Aggravated by  walking. AND SLEEPING on the left side.   Started noticing it after she stopped taking the daily meloxcima prescribed by DR Hyacinth Meeker for Plantar fasciitis.  She has no history of trauma, infection or fall.   Tried meloxicam last night and it helped    Outpatient Medications Prior to Visit  Medication Sig Dispense Refill  . atorvastatin (LIPITOR) 20 MG tablet TAKE 1 TABLET BY MOUTH DAILY 90 tablet 3  . cetirizine (ZYRTEC) 10 MG tablet Take 1 tablet (10 mg total) by mouth daily. 90 tablet 1  . cholecalciferol (VITAMIN D3) 25 MCG (1000 UNIT) tablet Take 1,000 Units by mouth daily.    . clobetasol cream (TEMOVATE) 0.05 % Apply 1 Application topically 2 (two) times daily. Bid to rash/fissures on fingers until clear, then prn flares, avoid face, groin, axilla 30 g 1  . DENTAGEL 1.1 % GEL dental gel     . fluorouracil (EFUDEX) 5 % cream Apply the cream twice per day to the area where the skin cancer was and a quarter inch around that area. Apply until the redness and irritation develop (usually occurs by day 7), then stop and allow it to heal. Protect the area from sunlight while it is healing with a hat or SPF30+ sunscreen. 40 g 1  . meloxicam (MOBIC) 15 MG tablet Take 1 tablet (15 mg total) by mouth daily. 30 tablet 3  . mometasone (NASONEX) 50 MCG/ACT nasal spray Place 2 sprays into the nose daily.    Marland Kitchen telmisartan-hydrochlorothiazide (MICARDIS HCT) 40-12.5 MG tablet Take 1 tablet by mouth daily. 90 tablet 1   No facility-administered medications prior to visit.    Review of Systems;  Patient denies headache,  fevers, malaise, unintentional weight loss, skin rash, eye pain, sinus congestion and sinus pain, sore throat, dysphagia,  hemoptysis , cough, dyspnea, wheezing, chest pain, palpitations, orthopnea, edema, abdominal pain, nausea, melena, diarrhea, constipation, flank pain, dysuria, hematuria, urinary  Frequency, nocturia, numbness, tingling, seizures,  Focal weakness, Loss of consciousness,  Tremor, insomnia, depression, anxiety, and suicidal ideation.      Objective:  BP (!) 138/58   Pulse 82   Ht 5\' 4"  (1.626 m)   Wt 144 lb 6.4 oz (65.5 kg)   SpO2 98%   BMI 24.79 kg/m   BP Readings from Last 3 Encounters:  04/30/23 (!) 138/58  01/29/23 124/64  07/31/22 122/72    Wt Readings from Last 3 Encounters:  04/30/23 144 lb 6.4 oz (65.5 kg)  01/29/23 142 lb 9.6 oz (64.7 kg)  10/10/22 142 lb (64.4 kg)    Physical Exam Vitals reviewed.  Constitutional:      General: She is not in acute distress.    Appearance: Normal appearance. She is normal weight. She is not ill-appearing, toxic-appearing or diaphoretic.  HENT:     Head: Normocephalic.  Eyes:     General: No scleral icterus.       Right eye: No discharge.        Left eye: No discharge.  Conjunctiva/sclera: Conjunctivae normal.  Cardiovascular:     Rate and Rhythm: Normal rate and regular rhythm.     Heart sounds: Normal heart sounds.  Pulmonary:     Effort: Pulmonary effort is normal. No respiratory distress.     Breath sounds: Normal breath sounds.  Musculoskeletal:        General: Tenderness present.     Left hip: Tenderness and bony tenderness present. Decreased range of motion.       Legs:  Skin:    General: Skin is warm and dry.  Neurological:     General: No focal deficit present.     Mental Status: She is alert and oriented to person, place, and time. Mental status is at baseline.  Psychiatric:        Mood and Affect: Mood normal.        Behavior: Behavior normal.        Thought Content: Thought content  normal.        Judgment: Judgment normal.   Lab Results  Component Value Date   HGBA1C 5.8 01/29/2023   HGBA1C 5.8 01/10/2022    Lab Results  Component Value Date   CREATININE 0.87 01/29/2023   CREATININE 0.97 07/19/2022   CREATININE 0.77 01/10/2022    Lab Results  Component Value Date   WBC 6.3 01/29/2023   HGB 11.6 (L) 01/29/2023   HCT 34.5 (L) 01/29/2023   PLT 247.0 01/29/2023   GLUCOSE 69 (L) 01/29/2023   CHOL 158 01/29/2023   TRIG 76.0 01/29/2023   HDL 53.20 01/29/2023   LDLDIRECT 83.0 01/29/2023   LDLCALC 90 01/29/2023   ALT 13 01/29/2023   AST 17 01/29/2023   NA 141 01/29/2023   K 3.8 01/29/2023   CL 106 01/29/2023   CREATININE 0.87 01/29/2023   BUN 23 01/29/2023   CO2 31 01/29/2023   TSH 0.76 01/29/2023   HGBA1C 5.8 01/29/2023   MICROALBUR 1.2 07/19/2022    MM 3D SCREENING MAMMOGRAM BILATERAL BREAST Result Date: 04/01/2023 CLINICAL DATA:  Screening. EXAM: DIGITAL SCREENING BILATERAL MAMMOGRAM WITH TOMOSYNTHESIS AND CAD TECHNIQUE: Bilateral screening digital craniocaudal and mediolateral oblique mammograms were obtained. Bilateral screening digital breast tomosynthesis was performed. The images were evaluated with computer-aided detection. COMPARISON:  Previous exam(s). ACR Breast Density Category c: The breasts are heterogeneously dense, which may obscure small masses. FINDINGS: There are no findings suspicious for malignancy. IMPRESSION: No mammographic evidence of malignancy. A result letter of this screening mammogram will be mailed directly to the patient. RECOMMENDATION: Screening mammogram in one year. (Code:SM-B-01Y) BI-RADS CATEGORY  1: Negative. Electronically Signed   By: Frederico Hamman M.D.   On: 04/01/2023 07:03    Assessment & Plan:  .There are no diagnoses linked to this encounter.   I spent 34 minutes on the day of this face to face encounter reviewing patient's  most recent visit with cardiology,  nephrology,  and neurology,  prior relevant  surgical and non surgical procedures, recent  labs and imaging studies, counseling on weight management,  reviewing the assessment and plan with patient, and post visit ordering and reviewing of  diagnostics and therapeutics with patient  .   Follow-up: No follow-ups on file.   Sherlene Shams, MD

## 2023-05-13 ENCOUNTER — Ambulatory Visit: Payer: Medicare HMO | Admitting: Dermatology

## 2023-05-13 ENCOUNTER — Encounter: Payer: Self-pay | Admitting: Dermatology

## 2023-05-13 DIAGNOSIS — D0439 Carcinoma in situ of skin of other parts of face: Secondary | ICD-10-CM | POA: Diagnosis not present

## 2023-05-13 DIAGNOSIS — D099 Carcinoma in situ, unspecified: Secondary | ICD-10-CM

## 2023-05-13 NOTE — Progress Notes (Signed)
   Follow-Up Visit   Subjective  Terri Melendez is a 73 y.o. female who presents for the following: SCCis arising in AK follow up. Left side of nose. Bx: 03/12/2023. Tx with 5FU/Calcipotriene as directed. Used from 2/2/-04/14/2023. Patient c/o some scaling at area. Is concerned   The patient has spots, moles and lesions to be evaluated, some may be new or changing and the patient may have concern these could be cancer.    The following portions of the chart were reviewed this encounter and updated as appropriate: medications, allergies, medical history  Review of Systems:  No other skin or systemic complaints except as noted in HPI or Assessment and Plan.  Objective  Well appearing patient in no apparent distress; mood and affect are within normal limits.  A focused examination was performed of the following areas: Face, nose  Relevant physical exam findings are noted in the Assessment and Plan.      Assessment & Plan     HISTORY OF SQUAMOUS CELL CARCINOMA IN SITU OF THE SKIN - No evidence of recurrence today. Exam: Mild scaling at superior pole of scar, likely from healing given that patient had a strong reaction to 5FU and given lesion was AK with focal SCCis (so low concern for invasive lesion) - Recommend regular full body skin exams - Recommend daily broad spectrum sunscreen SPF 30+ to sun-exposed areas, reapply every 2 hours as needed.  - Call if any new or changing lesions are noted between office visits    Return in about 3 months (around 08/13/2023) for SCCis follow up .  I, Lawson Radar, CMA, am acting as scribe for Elie Goody, MD.   Documentation: I have reviewed the above documentation for accuracy and completeness, and I agree with the above.  Elie Goody, MD

## 2023-05-13 NOTE — Patient Instructions (Addendum)

## 2023-05-20 ENCOUNTER — Encounter: Payer: Self-pay | Admitting: Internal Medicine

## 2023-05-28 ENCOUNTER — Encounter: Payer: Self-pay | Admitting: Internal Medicine

## 2023-05-30 MED ORDER — MOMETASONE FUROATE 50 MCG/ACT NA SUSP
2.0000 | Freq: Every day | NASAL | 11 refills | Status: AC
Start: 2023-05-30 — End: ?

## 2023-06-10 ENCOUNTER — Encounter: Payer: Self-pay | Admitting: Internal Medicine

## 2023-06-10 DIAGNOSIS — G8929 Other chronic pain: Secondary | ICD-10-CM

## 2023-06-11 MED ORDER — PREDNISONE 10 MG PO TABS: ORAL_TABLET | ORAL | 0 refills | Status: DC

## 2023-06-12 ENCOUNTER — Other Ambulatory Visit: Payer: Self-pay | Admitting: Internal Medicine

## 2023-06-12 MED ORDER — PANTOPRAZOLE SODIUM 20 MG PO TBEC: 20.0000 mg | DELAYED_RELEASE_TABLET | Freq: Every day | ORAL | 1 refills | Status: DC

## 2023-06-17 ENCOUNTER — Encounter: Payer: Self-pay | Admitting: Internal Medicine

## 2023-06-17 MED ORDER — CETIRIZINE HCL 10 MG PO TABS: 10.0000 mg | ORAL_TABLET | Freq: Every day | ORAL | 1 refills | Status: DC

## 2023-06-18 ENCOUNTER — Ambulatory Visit: Attending: Internal Medicine | Admitting: Physical Therapy

## 2023-06-18 DIAGNOSIS — M25552 Pain in left hip: Secondary | ICD-10-CM | POA: Diagnosis present

## 2023-06-18 DIAGNOSIS — G8929 Other chronic pain: Secondary | ICD-10-CM | POA: Diagnosis not present

## 2023-06-18 NOTE — Therapy (Addendum)
 OUTPATIENT PHYSICAL THERAPY LOWER EXTREMITY EVALUATION   Patient Name: Terri Melendez MRN: [id number] DOB:1950/08/28, 73 y.o., female Today's Date: 06/18/2023  END OF SESSION:  PT End of Session - 06/18/23 1217     Visit Number 1    Number of Visits 20    Date for PT Re-Evaluation 08/27/23    Authorization Type [employer] [id number]    Authorization - Visit Number 1    Authorization - Number of Visits 20    Progress Note Due on Visit 10    PT Start Time 0900    PT Stop Time 0945    PT Time Calculation (min) 45 min    Activity Tolerance Patient tolerated treatment well    Behavior During Therapy [facility] for tasks assessed/performed             Past Medical History:  Diagnosis Date   Actinic keratosis    Cancer (HCC)    basal cell on face in 2000 or 2001   Hx of basal cell carcinoma 2002   right cheek    Hx of basal cell carcinoma 02/11/2020   glabella, Mohs with Dr. Debrah Fan 06/03/2020   Hypertension    Osteopenia    Sinus congestion    Past Surgical History:  Procedure Laterality Date   BASAL CELL CARCINOMA EXCISION     TONSILLECTOMY AND ADENOIDECTOMY     Patient Active Problem List   Diagnosis Date Noted   Trochanteric bursitis of left hip 04/30/2023   Plantar fasciitis of left foot 03/28/2022   Anemia 01/10/2022   Allergic rhinitis 06/17/2020   GERD with esophagitis 06/17/2020   Personal history of other malignant neoplasm of skin 06/03/2020   Polyarthralgia 12/15/2019   Thoracic aortic atherosclerosis (HCC) 12/15/2019   Coronary atherosclerosis due to calcified coronary lesion (CODE) 12/15/2019   Encounter for preventive health examination 12/13/2018   History of shingles 05/04/2018   Non-functional thyroid  nodule 05/04/2018   Essential hypertension 10/22/2017   Advanced care planning/counseling discussion 10/22/2017   Hypercholesteremia 05/14/2016   OSA on CPAP 10/13/2014    PCP: Dr. Creta Dolin   REFERRING PROVIDER: Dr. Creta Dolin   REFERRING  DIAG: Left Hip Pain   THERAPY DIAG:  Pain in left hip  Rationale for Evaluation and Treatment: Rehabilitation  ONSET DATE: 03/09/2023   SUBJECTIVE:   SUBJECTIVE STATEMENT: See pertinent information   PERTINENT HISTORY: Pt reports that she started to experience lateral left hip pain a few months ago. She had imaging which showed mild arthritis in her left hip. She experiences most of her pain when walking. She recently twisted her right knee when changing directions and she had it examined and torn ligaments were ruled out. Pt reports having an upcoming trip where she will need to get into and off of a bus.     PAIN:  Are you having pain? Yes: NPRS scale: 7-8/10 Pain location: Lateral hip over greater trochanter and radiates down lateral side of hip Pain description: Throbbing.  Aggravating factors: Walking  Relieving factors: Prednisone  and sitting still    PRECAUTIONS: None  RED FLAGS: None   WEIGHT BEARING RESTRICTIONS: No  FALLS:  Has patient fallen in last 6 months? No  LIVING ENVIRONMENT: Lives with: lives with their spouse Lives in: House/apartment Stairs: Yes: Internal: 13 steps; on right going up and on left going up and External: 1 steps; none Has following equipment at home: None  OCCUPATION: Retired    PLOF: Independent  PATIENT GOALS: To feel less  pain in left hip so she can enjoy her upcoming bus trip   NEXT MD VISIT: None scheduled   OBJECTIVE:  Note: Objective measures were completed at Evaluation unless otherwise noted.  LEFS 58% (46/80)  DIAGNOSTIC FINDINGS: CLINICAL DATA:  Bilateral hip pain for 3 months   EXAM: DG HIP (WITH OR WITHOUT PELVIS) 2-3V LEFT   COMPARISON:  None available   FINDINGS: No fracture or dislocation. Mild degenerative changes of the visualized lower lumbar spine and bilateral sacroiliac joints. Mild degenerative changes of the left hip. Soft tissues are unremarkable.   IMPRESSION: Mild left hip osteoarthrosis.      Electronically Signed   By: Elester Grim M.D.   On: 05/19/2023 07:28  PATIENT SURVEYS:  LEFS 58% and 46/80   COGNITION: Overall cognitive status: Within functional limits for tasks assessed     SENSATION: WFL  MUSCLE LENGTH: Hamstrings: Right 90 deg; Left 80 deg   POSTURE: No Significant postural limitations  PALPATION: Greater Trochanter of left hip TTP   LOWER EXTREMITY ROM:  Active ROM Right eval Left eval  Hip flexion    Hip extension    Hip abduction    Hip adduction    Hip internal rotation    Hip external rotation    Knee flexion    Knee extension    Ankle dorsiflexion    Ankle plantarflexion    Ankle inversion    Ankle eversion     (Blank rows = not tested)  LOWER EXTREMITY MMT:  MMT Right eval Left eval  Hip flexion 4 4  Hip extension 4 4  Hip abduction 4 4-  Hip adduction 4 4  Hip internal rotation    Hip external rotation    Knee flexion 4 4  Knee extension 4 4  Ankle dorsiflexion    Ankle plantarflexion    Ankle inversion    Ankle eversion     (Blank rows = not tested)  LOWER EXTREMITY SPECIAL TESTS:  Hip special tests: Portia Brittle (FABER) test: positive , Ely's test: negative, and Anterior hip impingement test: negative   FUNCTIONAL TESTS:  6 minute walk test: NT Squats: NT   GAIT: Distance walked:  Assistive device utilized: None Level of assistance: Complete Independence Comments: No gait abnormalities noted, donning right knee brace                                                                                                                                 TREATMENT DATE: 06/18/23   THEREX  Prone Quad Stretch 2 x 60 sec   Standing Hip Abduction with 1 UE support  2 x 10 Seated HS Stretch 2 x 60 sec     PATIENT EDUCATION:  Education details: Form and technique for correct performance of exercise. Explanation of potential differentials for left hip pain.  Person educated: Patient Education method: Explanation,  Demonstration, Verbal cues, and Handouts Education comprehension: verbalized understanding, returned demonstration, and  verbal cues required  HOME EXERCISE PROGRAM: Access Code: [email or web address] URL: [email or web address] Date: 06/18/2023 Prepared by: Marge Shed  Exercises - Seated Hamstring Stretch  - 1 x daily - 7 x weekly - 3 reps - 60 sec hold - Standing Hip Abduction with Counter Support  - 3-4 x weekly - 3 sets - 10 reps - Prone Quadriceps Stretch with Strap  - 1 x daily - 7 x weekly - 3 reps - 60 sec  hold  ASSESSMENT:  CLINICAL IMPRESSION: Patient is a 73 y.o. white woman who was seen today for physical therapy evaluation and treatment for insidious left hip pain. She has signs and symptoms of greater trochanter pain syndrome versus trochanteric bursitis with pain localized to the lateral portion of the left hip and pain elicited with hip flexion from extension. Hip OA is less likely here given that pt does not have pain in groin area and pain is not elicited with flexion and internal rotation. Further testing needed to rule in GTPS over trochanteric bursitis. She shows decreased hip strength and decreased flexibility along with increased left hip pain especially when walking. She will benefit from skilled PT to address these aforementioned deficits to return to walking longer distances without pain and discomfort and to return to walking to maintain health and fitness.   OBJECTIVE IMPAIRMENTS: decreased strength, impaired flexibility, and pain.   ACTIVITY LIMITATIONS: carrying, lifting, bending, standing, squatting, stairs, and locomotion level  PARTICIPATION LIMITATIONS: shopping, community activity, and yard work  PERSONAL FACTORS: 3+ comorbidities: HTN, HLD, and Anemia  are also affecting patient's functional outcome.   REHAB POTENTIAL: Good  CLINICAL DECISION MAKING: Stable/uncomplicated  EVALUATION COMPLEXITY: Low   GOALS: Goals reviewed with patient?  No  SHORT TERM GOALS: Target date: 07/02/2023  Patient will demonstrate undestanding of home exercise plan by performing exercises correctly with evidence of good carry over with min to no verbal or tactile cues .   Baseline: NT  Goal status: INITIAL    LONG TERM GOALS: Target date: 08/27/2023  Patient will show an improvement in the hip disability and osteoarthritis outcome score by >=9 pts as evidence of significant change in score and improvement in right hip function.  Baseline: 46/80 (58%) Goal status: INITIAL  2.  Pt will increase by at least 50m (146ft) in order to demonstrate clinically significant improvement in cardiopulmonary endurance and community ambulation Baseline: NT   Goal status: INITIAL  3.  Patient will improve left hip strength by 1/3 grade MMT (4- to 4) for improved left hip stability and to improve left hip function.  Baseline: All measurements on left hip Flex 4, Abd 4-, Ext 4  Goal status: INITIAL    PLAN:  PT FREQUENCY: 1-2x/week  PT DURATION: 10 weeks  PLANNED INTERVENTIONS: [id number]- PT Re-evaluation, 97750- Physical Performance Testing, 97110-Therapeutic exercises, 97530- Therapeutic activity, [id number]- Neuromuscular re-education, 97535- Self Care, [id number]- Manual therapy, [id number]- Gait training, [phone]- Orthotic/Prosthetic subsequent, [id number]- Aquatic Therapy, W2956- Electrical stimulation (unattended), [phone]- Electrical stimulation (manual), [id number]- Ultrasound, 65784- Traction (mechanical), Patient/Family education, Balance training, Stair training, Taping, Dry Needling, Joint mobilization, Joint manipulation, Spinal manipulation, Spinal mobilization, DME instructions, Cryotherapy, and Moist heat  PLAN FOR NEXT SESSION: Squats and step overs. Progress hip strengthening exercises: mini-squats and side lying hip abduction. Test pain response to hip flex from hip extension.   Marge Shed PT, DPT  [facility] [address] [place], [address] Phone: [phone]   Fax:  [phone]

## 2023-06-25 ENCOUNTER — Ambulatory Visit: Admitting: Physical Therapy

## 2023-06-25 DIAGNOSIS — M25552 Pain in left hip: Secondary | ICD-10-CM | POA: Diagnosis not present

## 2023-06-25 NOTE — Therapy (Signed)
 OUTPATIENT PHYSICAL THERAPY LOWER EXTREMITY TREATMENT    Patient Name: Terri Melendez MRN: [id number] DOB:[birth year], 73 y.o., female Today's Date: 06/25/2023  END OF SESSION:  PT End of Session - 06/25/23 0949     Visit Number 2    Number of Visits 20    Date for PT Re-Evaluation 08/27/23    Authorization Type [employer] 2025    Authorization - Visit Number 2    Authorization - Number of Visits 20    Progress Note Due on Visit 10    PT Start Time 0945    PT Stop Time 1030    PT Time Calculation (min) 45 min    Activity Tolerance Patient tolerated treatment well    Behavior During Therapy [facility] for tasks assessed/performed             Past Medical History:  Diagnosis Date   Actinic keratosis    Cancer (HCC)    basal cell on face in 2000 or 2001   Hx of basal cell carcinoma 2002   right cheek    Hx of basal cell carcinoma 02/11/2020   glabella, Mohs with Dr. Debrah Melendez 06/03/2020   Hypertension    Osteopenia    Sinus congestion    Past Surgical History:  Procedure Laterality Date   BASAL CELL CARCINOMA EXCISION     TONSILLECTOMY AND ADENOIDECTOMY     Patient Active Problem List   Diagnosis Date Noted   Trochanteric bursitis of left hip 04/30/2023   Plantar fasciitis of left foot 03/28/2022   Anemia 01/10/2022   Allergic rhinitis 06/17/2020   GERD with esophagitis 06/17/2020   Personal history of other malignant neoplasm of skin 06/03/2020   Polyarthralgia 12/15/2019   Thoracic aortic atherosclerosis (HCC) 12/15/2019   Coronary atherosclerosis due to calcified coronary lesion (CODE) 12/15/2019   Encounter for preventive health examination 12/13/2018   History of shingles 05/04/2018   Non-functional thyroid  nodule 05/04/2018   Essential hypertension 10/22/2017   Advanced care planning/counseling discussion 10/22/2017   Hypercholesteremia 05/14/2016   OSA on CPAP 10/13/2014    PCP: Dr. Creta Melendez   REFERRING PROVIDER: Dr. Creta Melendez   REFERRING  DIAG: Left Hip Pain   THERAPY DIAG:  Pain in left hip  Rationale for Evaluation and Treatment: Rehabilitation  ONSET DATE: 03/09/2023   SUBJECTIVE:   SUBJECTIVE STATEMENT: Pt reports that she continues to experience right knee pain that is worse than her left hip pain.   PERTINENT HISTORY: Pt reports that she started to experience lateral left hip pain a few months ago. She had imaging which showed mild arthritis in her left hip. She experiences most of her pain when walking. She recently twisted her right knee when changing directions and she had it examined and torn ligaments were ruled out. Pt reports having an upcoming trip where she will need to get into and off of a bus.     PAIN:  Are you having pain? Yes: NPRS scale: 7-8/10 Pain location: Lateral hip over greater trochanter and radiates down lateral side of hip Pain description: Throbbing.  Aggravating factors: Walking  Relieving factors: Prednisone  and sitting still    PRECAUTIONS: None  RED FLAGS: None   WEIGHT BEARING RESTRICTIONS: No  FALLS:  Has patient fallen in last 6 months? No  LIVING ENVIRONMENT: Lives with: lives with their spouse Lives in: House/apartment Stairs: Yes: Internal: 13 steps; on right going up and on left going up and External: 1 steps; none Has following equipment at  home: None  OCCUPATION: Retired    PLOF: Independent  PATIENT GOALS: To feel less pain in left hip so she can enjoy her upcoming bus trip   NEXT MD VISIT: None scheduled   OBJECTIVE:  Note: Objective measures were completed at Evaluation unless otherwise noted.  LEFS 58% (46/80)  DIAGNOSTIC FINDINGS: CLINICAL DATA:  Bilateral hip pain for 3 months   EXAM: DG HIP (WITH OR WITHOUT PELVIS) 2-3V LEFT   COMPARISON:  None available   FINDINGS: No fracture or dislocation. Mild degenerative changes of the visualized lower lumbar spine and bilateral sacroiliac joints. Mild degenerative changes of the left hip. Soft  tissues are unremarkable.   IMPRESSION: Mild left hip osteoarthrosis.     Electronically Signed   By: Terri Melendez M.D.   On: 05/19/2023 07:28  PATIENT SURVEYS:  LEFS 58% and 46/80   COGNITION: Overall cognitive status: Within functional limits for tasks assessed     SENSATION: WFL  MUSCLE LENGTH: Hamstrings: Right 90 deg; Left 80 deg   POSTURE: No Significant postural limitations  PALPATION: Greater Trochanter of left hip TTP   LOWER EXTREMITY ROM:  Active ROM Right eval Left eval  Hip flexion    Hip extension    Hip abduction    Hip adduction    Hip internal rotation    Hip external rotation    Knee flexion    Knee extension    Ankle dorsiflexion    Ankle plantarflexion    Ankle inversion    Ankle eversion     (Blank rows = not tested)  LOWER EXTREMITY MMT:  MMT Right eval Left eval  Hip flexion 4 4  Hip extension 4 4  Hip abduction 4 4-  Hip adduction 4 4  Hip internal rotation    Hip external rotation    Knee flexion 4 4  Knee extension 4 4  Ankle dorsiflexion    Ankle plantarflexion    Ankle inversion    Ankle eversion     (Blank rows = not tested)  LOWER EXTREMITY SPECIAL TESTS:  Hip special tests: Terri Melendez (FABER) test: positive , Ely's test: negative, and Anterior hip impingement test: negative   FUNCTIONAL TESTS:  6 minute walk test: NT Squats: NT   GAIT: Distance walked:  Assistive device utilized: None Level of assistance: Complete Independence Comments: No gait abnormalities noted, donning right knee brace                                                                                                                                 TREATMENT DATE:   06/25/23: THEREX   Nu-Step with seat and arms at 7 for 5 min level 3   LLE clam shell with red band 1 x 10  LLE clam shell with green band 1 x 10  Seated ER stretch on LLE 3 x 60 sec    THERAC Step Up on LLE on 4 inch step  1 x 10  Step Up on LLE on 6 inch step 1 x 10   Step Down on RLE on 6 inch step 1 x 10    PHYSICAL PERFORMANCE  Partial Squat x 5- Symmetrical loading with knees remaining behind toes Negotiating Stairs- Step over step with BUE support -min VC to go up stairs with good leg (LLE) and down with bad (RLE)   06/18/23 (Eval) THEREX  Prone Quad Stretch 2 x 60 sec   Standing Hip Abduction with 1 UE support  2 x 10 Seated HS Stretch 2 x 60 sec     PATIENT EDUCATION:  Education details: Form and technique for correct performance of exercise. Explanation of potential differentials for left hip pain.  Person educated: Patient Education method: Explanation, Demonstration, Verbal cues, and Handouts Education comprehension: verbalized understanding, returned demonstration, and verbal cues required  HOME EXERCISE PROGRAM: Access Code: [email or web address] URL: [email or web address] Date: 06/25/2023 Prepared by: Marge Shed  Exercises - Seated Hip External Rotation Stretch  - 1 x daily - 7 x weekly - 3 reps - 30-60 sec  hold - Seated Hamstring Stretch  - 1 x daily - 7 x weekly - 3 reps - 60 sec hold - Prone Quadriceps Stretch with Strap  - 1 x daily - 7 x weekly - 3 reps - 60 sec  hold - Step Up  - 3-4 x weekly - 3 sets - 10 reps - Clam with Resistance  - 3-4 x weekly - 3 sets - 10 reps  ASSESSMENT:  CLINICAL IMPRESSION: Pt presents for initial treatment after eval for left hip pain. Pt was able to demonstrate understanding of stair negotiation strategy by performing it independently. She continues to be limited by right knee pain especially with knee flexion on RLE. She was not limited by pain during the session and she was able to perform all strengthening exercises without an increase in her left hip or right knee pain.  She will benefit from skilled PT to address these aforementioned deficits to return to walking longer distances without pain and discomfort and to return to walking to maintain health and fitness.    OBJECTIVE  IMPAIRMENTS: decreased strength, impaired flexibility, and pain.   ACTIVITY LIMITATIONS: carrying, lifting, bending, standing, squatting, stairs, and locomotion level  PARTICIPATION LIMITATIONS: shopping, community activity, and yard work  PERSONAL FACTORS: 3+ comorbidities: HTN, HLD, and Anemia  are also affecting patient's functional outcome.   REHAB POTENTIAL: Good  CLINICAL DECISION MAKING: Stable/uncomplicated  EVALUATION COMPLEXITY: Low   GOALS: Goals reviewed with patient? No  SHORT TERM GOALS: Target date: 07/02/2023  Patient will demonstrate undestanding of home exercise plan by performing exercises correctly with evidence of good carry over with min to no verbal or tactile cues .   Baseline: NT 4/29/2 Goal status: ONGOING      LONG TERM GOALS: Target date: 08/27/2023  Patient will show an improvement in the hip disability and osteoarthritis outcome score by >=9 pts as evidence of significant change in score and improvement in right hip function.  Baseline: 46/80 (58%) Goal status: ONGOING    2.  Pt will increase by at least 34m (165ft) in order to demonstrate clinically significant improvement in cardiopulmonary endurance and community ambulation Baseline: NT   Goal status: ONGOING    3.  Patient will improve left hip strength by 1/3 grade MMT (4- to 4) for improved left hip stability and to improve left hip function.  Baseline: All measurements on left hip Flex  4, Abd 4-, Ext 4  Goal status: ONGOING     PLAN:  PT FREQUENCY: 1-2x/week  PT DURATION: 10 weeks  PLANNED INTERVENTIONS: 97164- PT Re-evaluation, 97750- Physical Performance Testing, 97110-Therapeutic exercises, 97530- Therapeutic activity, [id number]- Neuromuscular re-education, 97535- Self Care, 16109- Manual therapy, [id number]- Gait training, [phone]- Orthotic/Prosthetic subsequent, [phone]- Aquatic Therapy, B1478- Electrical stimulation (unattended), [phone]- Electrical stimulation (manual), [id number]- Ultrasound,  13086- Traction (mechanical), Patient/Family education, Balance training, Stair training, Taping, Dry Needling, Joint mobilization, Joint manipulation, Spinal manipulation, Spinal mobilization, DME instructions, Cryotherapy, and Moist heat  PLAN FOR NEXT SESSION: . Test pain response to hip flex from hip extension. Continue to progress hip strengthening exercises: standing marches. Sit to stands and [facility] Leg Press    Marge Shed PT, DPT  [facility] [address] [place], [address] Phone: [phone]   Fax:  [phone]

## 2023-06-27 ENCOUNTER — Ambulatory Visit: Attending: Internal Medicine | Admitting: Physical Therapy

## 2023-06-27 DIAGNOSIS — M25552 Pain in left hip: Secondary | ICD-10-CM | POA: Insufficient documentation

## 2023-06-27 DIAGNOSIS — R262 Difficulty in walking, not elsewhere classified: Secondary | ICD-10-CM | POA: Insufficient documentation

## 2023-06-27 NOTE — Therapy (Signed)
 OUTPATIENT PHYSICAL THERAPY LOWER EXTREMITY TREATMENT    Patient Name: Terri Melendez MRN: [id number] DOB:08-Apr-1950, 73 y.o., female Today's Date: 06/27/2023  END OF SESSION:  PT End of Session - 06/27/23 1738     Visit Number 3    Number of Visits 20    Date for PT Re-Evaluation 08/27/23    Authorization Type [employer] 2025    Authorization - Visit Number 3    Authorization - Number of Visits 20    Progress Note Due on Visit 10    PT Start Time 1735    PT Stop Time 1815    PT Time Calculation (min) 40 min    Activity Tolerance Patient tolerated treatment well    Behavior During Therapy [facility] for tasks assessed/performed              Past Medical History:  Diagnosis Date   Actinic keratosis    Cancer (HCC)    basal cell on face in 2000 or 2001   Hx of basal cell carcinoma 2002   right cheek    Hx of basal cell carcinoma 02/11/2020   glabella, Mohs with Dr. Debrah Fan 06/03/2020   Hypertension    Osteopenia    Sinus congestion    Past Surgical History:  Procedure Laterality Date   BASAL CELL CARCINOMA EXCISION     TONSILLECTOMY AND ADENOIDECTOMY     Patient Active Problem List   Diagnosis Date Noted   Trochanteric bursitis of left hip 04/30/2023   Plantar fasciitis of left foot 03/28/2022   Anemia 01/10/2022   Allergic rhinitis 06/17/2020   GERD with esophagitis 06/17/2020   Personal history of other malignant neoplasm of skin 06/03/2020   Polyarthralgia 12/15/2019   Thoracic aortic atherosclerosis (HCC) 12/15/2019   Coronary atherosclerosis due to calcified coronary lesion (CODE) 12/15/2019   Encounter for preventive health examination 12/13/2018   History of shingles 05/04/2018   Non-functional thyroid  nodule 05/04/2018   Essential hypertension 10/22/2017   Advanced care planning/counseling discussion 10/22/2017   Hypercholesteremia 05/14/2016   OSA on CPAP 10/13/2014    PCP: Dr. Creta Dolin   REFERRING PROVIDER: Dr. Creta Dolin    REFERRING DIAG: Left Hip Pain   THERAPY DIAG:  Pain in left hip  Rationale for Evaluation and Treatment: Rehabilitation  ONSET DATE: 03/09/2023   SUBJECTIVE:   SUBJECTIVE STATEMENT: Pt states that she is having less left knee pain today and that she was able to do exercises without much difficulty.   PERTINENT HISTORY: Pt reports that she started to experience lateral left hip pain a few months ago. She had imaging which showed mild arthritis in her left hip. She experiences most of her pain when walking. She recently twisted her right knee when changing directions and she had it examined and torn ligaments were ruled out. Pt reports having an upcoming trip where she will need to get into and off of a bus.     PAIN:  Are you having pain? Yes: NPRS scale: 7-8/10 Pain location: Lateral hip over greater trochanter and radiates down lateral side of hip Pain description: Throbbing.  Aggravating factors: Walking  Relieving factors: Prednisone  and sitting still    PRECAUTIONS: None  RED FLAGS: None   WEIGHT BEARING RESTRICTIONS: No  FALLS:  Has patient fallen in last 6 months? No  LIVING ENVIRONMENT: Lives with: lives with their spouse Lives in: House/apartment Stairs: Yes: Internal: 13 steps; on right going up and on left going up and External: 1 steps;  none Has following equipment at home: None  OCCUPATION: Retired    PLOF: Independent  PATIENT GOALS: To feel less pain in left hip so she can enjoy her upcoming bus trip   NEXT MD VISIT: None scheduled   OBJECTIVE:  Note: Objective measures were completed at Evaluation unless otherwise noted.  LEFS 58% (46/80)  DIAGNOSTIC FINDINGS: CLINICAL DATA:  Bilateral hip pain for 3 months   EXAM: DG HIP (WITH OR WITHOUT PELVIS) 2-3V LEFT   COMPARISON:  None available   FINDINGS: No fracture or dislocation. Mild degenerative changes of the visualized lower lumbar spine and bilateral sacroiliac joints.  Mild degenerative changes of the left hip. Soft tissues are unremarkable.   IMPRESSION: Mild left hip osteoarthrosis.     Electronically Signed   By: Elester Grim M.D.   On: 05/19/2023 07:28  PATIENT SURVEYS:  LEFS 58% and 46/80   COGNITION: Overall cognitive status: Within functional limits for tasks assessed     SENSATION: WFL  MUSCLE LENGTH: Hamstrings: Right 90 deg; Left 80 deg   POSTURE: No Significant postural limitations  PALPATION: Greater Trochanter of left hip TTP   LOWER EXTREMITY ROM:  Active ROM Right eval Left eval  Hip flexion    Hip extension    Hip abduction    Hip adduction    Hip internal rotation    Hip external rotation    Knee flexion    Knee extension    Ankle dorsiflexion    Ankle plantarflexion    Ankle inversion    Ankle eversion     (Blank rows = not tested)  LOWER EXTREMITY MMT:  MMT Right eval Left eval  Hip flexion 4 4  Hip extension 4 4  Hip abduction 4 4-  Hip adduction 4 4  Hip internal rotation    Hip external rotation    Knee flexion 4 4  Knee extension 4 4  Ankle dorsiflexion    Ankle plantarflexion    Ankle inversion    Ankle eversion     (Blank rows = not tested)  LOWER EXTREMITY SPECIAL TESTS:  Hip special tests: Portia Brittle (FABER) test: positive , Ely's test: negative, and Anterior hip impingement test: negative   FUNCTIONAL TESTS:  6 minute walk test: NT Squats: NT   GAIT: Distance walked:  Assistive device utilized: None Level of assistance: Complete Independence Comments: No gait abnormalities noted, donning right knee brace                                                                                                                                 TREATMENT DATE:   06/27/23:  THEREX   Nu-Step with seat at 7 for 5 min level 3   Standing Marches with BUE support 2 x 10 Standing Marches with 1 UE support 2 x 10   Standing Marches with no UE support 2 x 10  Standing Marches with no UE  support and  red band for resistance 2 x 10  Sit to Stand without UE support 1 x 10   -Pt reports increased pain on lateral joint line of left knee and swelling on posterior side of knee  Sit to Sand without UE support and without knee brace 1 x 10  -min VC to decrease speed of eccentric phase; about same level of pain in lateral joint line of right knee as with brace on. No pain in left hip   PHYSICAL PERFORMANCE  : 1,250 ft HR 82   McMurry's: Negative on LLE   Palpable : Right knee lateral joint line TTP and Baker's cyst in popliteal fossa of right knee  Thessaly's Test: Negative  Forced Hyperextension: Negative     06/25/23: THEREX   Nu-Step with seat and arms at 7 for 5 min level 3   LLE clam shell with red band 1 x 10  LLE clam shell with green band 1 x 10  Seated ER stretch on LLE 3 x 60 sec    THERAC Step Up on LLE on 4 inch step  1 x 10  Step Up on LLE on 6 inch step 1 x 10  Step Down on RLE on 6 inch step 1 x 10    PHYSICAL PERFORMANCE  Partial Squat x 5- Symmetrical loading with knees remaining behind toes Negotiating Stairs- Step over step with BUE support -min VC to go up stairs with good leg (LLE) and down with bad (RLE)   06/18/23 (Eval) THEREX  Prone Quad Stretch 2 x 60 sec   Standing Hip Abduction with 1 UE support  2 x 10 Seated HS Stretch 2 x 60 sec     PATIENT EDUCATION:  Education details: Form and technique for correct performance of exercise. Explanation of potential differentials for left hip pain.  Person educated: Patient Education method: Explanation, Demonstration, Verbal cues, and Handouts Education comprehension: verbalized understanding, returned demonstration, and verbal cues required  HOME EXERCISE PROGRAM: Access Code: [email or web address] URL: [email or web address] Date: 06/27/2023 Prepared by: Marge Shed  Exercises - Seated Hip External Rotation Stretch  - 1 x daily - 7 x weekly - 3 reps - 30-60 sec  hold - Seated  Hamstring Stretch  - 1 x daily - 7 x weekly - 3 reps - 60 sec hold - Prone Quadriceps Stretch with Strap  - 1 x daily - 7 x weekly - 3 reps - 60 sec  hold - Step Up  - 3-4 x weekly - 3 sets - 10 reps - Clam with Resistance  - 3-4 x weekly - 3 sets - 10 reps - Sit to Stand  - 3-4 x weekly - 3 sets - 10 reps - Marching with Resistance  - 3-4 x weekly - 3 sets - 10 reps  ASSESSMENT:  CLINICAL IMPRESSION: Despite ongoing left knee pain, left knee meniscus pathology ruled out with negative McMurry's  and Thessaly's tests and no pain with hyperextension. Pain could be resulting from compression of the brace which has steel bar pressing up against lateral joint line. PT recommended that pt remove brace while at home to determine whether brace is causing pain or it is knee pathology. Pt is in agreement with this plan. Her aerobic endurance is not limited by right knee and left hip pain and she is able to ambulate distances beyond what signifies a community ambulator. She will continue to benefit from skilled PT to address these aforementioned deficits to return to walking longer distances without pain  and discomfort and to return to walking to maintain health and fitness.    OBJECTIVE IMPAIRMENTS: decreased strength, impaired flexibility, and pain.   ACTIVITY LIMITATIONS: carrying, lifting, bending, standing, squatting, stairs, and locomotion level  PARTICIPATION LIMITATIONS: shopping, community activity, and yard work  PERSONAL FACTORS: 3+ comorbidities: HTN, HLD, and Anemia  are also affecting patient's functional outcome.   REHAB POTENTIAL: Good  CLINICAL DECISION MAKING: Stable/uncomplicated  EVALUATION COMPLEXITY: Low   GOALS: Goals reviewed with patient? No  SHORT TERM GOALS: Target date: 07/02/2023  Patient will demonstrate undestanding of home exercise plan by performing exercises correctly with evidence of good carry over with min to no verbal or tactile cues .   Baseline: NT 06/25/23  Able to perform independently  Goal status: ACHIEVED      LONG TERM GOALS: Target date: 08/27/2023  Patient will show an improvement in the hip disability and osteoarthritis outcome score by >=9 pts as evidence of significant change in score and improvement in right hip function.  Baseline: 46/80 (58%) Goal status: ONGOING    2.  Pt will increase by at least 54m (164ft) in order to demonstrate clinically significant improvement in cardiopulmonary endurance and community ambulation Baseline: 1,250 ft  Goal status: DEFERRED   3.  Patient will improve left hip strength by 1/3 grade MMT (4- to 4) for improved left hip stability and to improve left hip function.  Baseline: All measurements on left hip Flex 4, Abd 4-, Ext 4  Goal status: ONGOING     PLAN:  PT FREQUENCY: 1-2x/week  PT DURATION: 10 weeks  PLANNED INTERVENTIONS: 97164- PT Re-evaluation, 97750- Physical Performance Testing, 97110-Therapeutic exercises, 97530- Therapeutic activity, [id number]- Neuromuscular re-education, 97535- Self Care, 19147- Manual therapy, [id number]- Gait training, [phone]- Orthotic/Prosthetic subsequent, [phone]- Aquatic Therapy, [phone]- Electrical stimulation (unattended), [phone]- Electrical stimulation (manual), [id number]- Ultrasound, 52841- Traction (mechanical), Patient/Family education, Balance training, Stair training, Taping, Dry Needling, Joint mobilization, Joint manipulation, Spinal manipulation, Spinal mobilization, DME instructions, Cryotherapy, and Moist heat  PLAN FOR NEXT SESSION: Continue with hip and knee strengthening: standing hip abduction and OMEGA leg press   Marge Shed PT, DPT  [facility] [address] [place], [address] Phone: [phone]   Fax:  [phone]

## 2023-07-02 ENCOUNTER — Ambulatory Visit: Admitting: Physical Therapy

## 2023-07-02 DIAGNOSIS — M25552 Pain in left hip: Secondary | ICD-10-CM | POA: Diagnosis not present

## 2023-07-02 DIAGNOSIS — R262 Difficulty in walking, not elsewhere classified: Secondary | ICD-10-CM

## 2023-07-02 NOTE — Therapy (Signed)
 OUTPATIENT PHYSICAL THERAPY LOWER EXTREMITY TREATMENT    Patient Name: Terri Melendez MRN: [id number] DOB:05/28/1950, 73 y.o., female Today's Date: 07/02/2023  END OF SESSION:  PT End of Session - 07/02/23 1308     Visit Number 4    Number of Visits 20    Date for PT Re-Evaluation 08/27/23    Authorization Type [employer] 2025    Authorization - Visit Number 4    Authorization - Number of Visits 20    Progress Note Due on Visit 10    PT Start Time 1305    PT Stop Time 1345    PT Time Calculation (min) 40 min    Activity Tolerance Patient tolerated treatment well    Behavior During Therapy [facility] for tasks assessed/performed              Past Medical History:  Diagnosis Date   Actinic keratosis    Cancer (HCC)    basal cell on face in 2000 or 2001   Hx of basal cell carcinoma 2002   right cheek    Hx of basal cell carcinoma 02/11/2020   glabella, Mohs with Dr. Debrah Fan 06/03/2020   Hypertension    Osteopenia    Sinus congestion    Past Surgical History:  Procedure Laterality Date   BASAL CELL CARCINOMA EXCISION     TONSILLECTOMY AND ADENOIDECTOMY     Patient Active Problem List   Diagnosis Date Noted   Trochanteric bursitis of left hip 04/30/2023   Plantar fasciitis of left foot 03/28/2022   Anemia 01/10/2022   Allergic rhinitis 06/17/2020   GERD with esophagitis 06/17/2020   Personal history of other malignant neoplasm of skin 06/03/2020   Polyarthralgia 12/15/2019   Thoracic aortic atherosclerosis (HCC) 12/15/2019   Coronary atherosclerosis due to calcified coronary lesion (CODE) 12/15/2019   Encounter for preventive health examination 12/13/2018   History of shingles 05/04/2018   Non-functional thyroid  nodule 05/04/2018   Essential hypertension 10/22/2017   Advanced care planning/counseling discussion 10/22/2017   Hypercholesteremia 05/14/2016   OSA on CPAP 10/13/2014    PCP: Dr. Creta Dolin   REFERRING PROVIDER: Dr. Creta Dolin    REFERRING DIAG: Left Hip Pain   THERAPY DIAG:  Pain in left hip  Difficulty in walking, not elsewhere classified  Rationale for Evaluation and Treatment: Rehabilitation  ONSET DATE: 03/09/2023   SUBJECTIVE:   SUBJECTIVE STATEMENT: Pt reports that she is experiencing an increased swelling in the posterior of the right knee.   PERTINENT HISTORY: Pt reports that she started to experience lateral left hip pain a few months ago. She had imaging which showed mild arthritis in her left hip. She experiences most of her pain when walking. She recently twisted her right knee when changing directions and she had it examined and torn ligaments were ruled out. Pt reports having an upcoming trip where she will need to get into and off of a bus.     PAIN:  Are you having pain? Yes: NPRS scale: 5-6/10 Pain location: Anterior surface of right knee   Pain description: Throbbing.  Aggravating factors: Walking  Relieving factors: Prednisone  and sitting still    PRECAUTIONS: None  RED FLAGS: None   WEIGHT BEARING RESTRICTIONS: No  FALLS:  Has patient fallen in last 6 months? No  LIVING ENVIRONMENT: Lives with: lives with their spouse Lives in: House/apartment Stairs: Yes: Internal: 13 steps; on right going up and on left going up and External: 1 steps; none Has following equipment  at home: None  OCCUPATION: Retired    PLOF: Independent  PATIENT GOALS: To feel less pain in left hip so she can enjoy her upcoming bus trip   NEXT MD VISIT: None scheduled   OBJECTIVE:  Note: Objective measures were completed at Evaluation unless otherwise noted.  LEFS 58% (46/80)  DIAGNOSTIC FINDINGS: CLINICAL DATA:  Bilateral hip pain for 3 months   EXAM: DG HIP (WITH OR WITHOUT PELVIS) 2-3V LEFT   COMPARISON:  None available   FINDINGS: No fracture or dislocation. Mild degenerative changes of the visualized lower lumbar spine and bilateral sacroiliac joints. Mild degenerative changes of  the left hip. Soft tissues are unremarkable.   IMPRESSION: Mild left hip osteoarthrosis.     Electronically Signed   By: Elester Grim M.D.   On: 05/19/2023 07:28  PATIENT SURVEYS:  LEFS 58% and 46/80   COGNITION: Overall cognitive status: Within functional limits for tasks assessed     SENSATION: WFL  MUSCLE LENGTH: Hamstrings: Right 90 deg; Left 80 deg   POSTURE: No Significant postural limitations  PALPATION: Greater Trochanter of left hip TTP   LOWER EXTREMITY ROM:  Active ROM Right eval Left eval  Hip flexion    Hip extension    Hip abduction    Hip adduction    Hip internal rotation    Hip external rotation    Knee flexion 128* 130  Knee extension 0 0  Ankle dorsiflexion    Ankle plantarflexion    Ankle inversion    Ankle eversion     (Blank rows = not tested)  LOWER EXTREMITY MMT:  MMT Right eval Left eval  Hip flexion 4 4  Hip extension 4 4  Hip abduction 4 4-  Hip adduction 4 4  Hip internal rotation    Hip external rotation    Knee flexion 4 4  Knee extension 4 4  Ankle dorsiflexion    Ankle plantarflexion    Ankle inversion    Ankle eversion     (Blank rows = not tested)  LOWER EXTREMITY SPECIAL TESTS:  Hip special tests: Portia Brittle (FABER) test: positive , Ely's test: negative, and Anterior hip impingement test: negative   FUNCTIONAL TESTS:  6 minute walk test: NT Squats: NT   GAIT: Distance walked:  Assistive device utilized: None Level of assistance: Complete Independence Comments: No gait abnormalities noted, donning right knee brace                                                                                                                                 TREATMENT DATE:   07/02/23: Donning right knee brace   TM at 1.0 mph for 5 min   -Stiff knee gait on RLE, decreased knee flexion  Squats x 5    Knee Circumference R/L 16"/15.25" Knee Ext R/L AROM 0/0  Knee Flex R/L AROM 128/130 deg Mini-Squat 2 x 10  -Pt reports  lateral right knee  pain   Supine Bridges 1 x 10  Supine Bridges with hip adduction isometric ball squeeze 1 x 10  Figure Bridges 2 x 10      06/27/23:  THEREX   Nu-Step with seat at 7 for 5 min level 3   Standing Marches with BUE support 2 x 10 Standing Marches with 1 UE support 2 x 10   Standing Marches with no UE support 2 x 10  Standing Marches with no UE support and red band for resistance 2 x 10  Sit to Stand without UE support 1 x 10   -Pt reports increased pain on lateral joint line of left knee and swelling on posterior side of knee  Sit to Sand without UE support and without knee brace 1 x 10  -min VC to decrease speed of eccentric phase; about same level of pain in lateral joint line of right knee as with brace on. No pain in left hip   PHYSICAL PERFORMANCE  : 1,250 ft HR 82   McMurry's: Negative on LLE   Palpable : Right knee lateral joint line TTP and Baker's cyst in popliteal fossa of right knee  Thessaly's Test: Negative  Forced Hyperextension: Negative     06/25/23: THEREX   Nu-Step with seat and arms at 7 for 5 min level 3   LLE clam shell with red band 1 x 10  LLE clam shell with green band 1 x 10  Seated ER stretch on LLE 3 x 60 sec    THERAC Step Up on LLE on 4 inch step  1 x 10  Step Up on LLE on 6 inch step 1 x 10  Step Down on RLE on 6 inch step 1 x 10    PHYSICAL PERFORMANCE  Partial Squat x 5- Symmetrical loading with knees remaining behind toes Negotiating Stairs- Step over step with BUE support -min VC to go up stairs with good leg (LLE) and down with bad (RLE)    PATIENT EDUCATION:  Education details: Form and technique for correct performance of exercise. Explanation of potential differentials for left hip pain.  Person educated: Patient Education method: Explanation, Demonstration, Verbal cues, and Handouts Education comprehension: verbalized understanding, returned demonstration, and verbal cues required  HOME EXERCISE  PROGRAM: Access Code: [email or web address] URL: [email or web address] Date: 07/02/2023 Prepared by: Marge Shed  Exercises - Seated Hip External Rotation Stretch  - 1 x daily - 7 x weekly - 3 reps - 30-60 sec  hold - Seated Hamstring Stretch  - 1 x daily - 7 x weekly - 3 reps - 60 sec hold - Prone Quadriceps Stretch with Strap  - 1 x daily - 7 x weekly - 3 reps - 60 sec  hold - Clam with Resistance  - 3-4 x weekly - 3 sets - 10 reps - Marching with Resistance  - 3-4 x weekly - 3 sets - 10 reps - Figure 4 Bridge  - 3-4 x weekly - 3 sets - 10 reps  ASSESSMENT:  CLINICAL IMPRESSION: Pt is limited by right knee pain during session with increased swelling, stiff legged gait during swing phase, and increased pain with knee flexion. She is going to schedule apt with medical provider for further medical eval and treatment given ongoing swelling and pain that has not improved for several weeks now. Exercises modified to include decreased knee flexion hip and knee strengthening exercises to avoid pain exacerbation like changing out sit to stands for single leg bridges. She will continue  to benefit from skilled PT to address these aforementioned deficits to return to walking longer distances without pain and discomfort and to return to walking to maintain health and fitness.    OBJECTIVE IMPAIRMENTS: decreased strength, impaired flexibility, and pain.   ACTIVITY LIMITATIONS: carrying, lifting, bending, standing, squatting, stairs, and locomotion level  PARTICIPATION LIMITATIONS: shopping, community activity, and yard work  PERSONAL FACTORS: 3+ comorbidities: HTN, HLD, and Anemia  are also affecting patient's functional outcome.   REHAB POTENTIAL: Good  CLINICAL DECISION MAKING: Stable/uncomplicated  EVALUATION COMPLEXITY: Low   GOALS: Goals reviewed with patient? No  SHORT TERM GOALS: Target date: 07/02/2023  Patient will demonstrate undestanding of home exercise plan by performing  exercises correctly with evidence of good carry over with min to no verbal or tactile cues .   Baseline: NT 06/25/23 Able to perform independently  Goal status: ACHIEVED      LONG TERM GOALS: Target date: 08/27/2023  Patient will show an improvement in the hip disability and osteoarthritis outcome score by >=9 pts as evidence of significant change in score and improvement in right hip function.  Baseline: 46/80 (58%) Goal status: ONGOING    2.  Pt will increase by at least 40m (158ft) in order to demonstrate clinically significant improvement in cardiopulmonary endurance and community ambulation Baseline: 1,250 ft  Goal status: ONGOING    3.  Patient will improve left hip strength by 1/3 grade MMT (4- to 4) for improved left hip stability and to improve left hip function.  Baseline: All measurements on left hip Flex 4, Abd 4-, Ext 4  Goal status: ONGOING     PLAN:  PT FREQUENCY: 1-2x/week  PT DURATION: 10 weeks  PLANNED INTERVENTIONS: 97164- PT Re-evaluation, 97750- Physical Performance Testing, 97110-Therapeutic exercises, 97530- Therapeutic activity, [id number]- Neuromuscular re-education, 97535- Self Care, 16109- Manual therapy, [id number]- Gait training, [phone]- Orthotic/Prosthetic subsequent, [id number]- Aquatic Therapy, [id number]- Electrical stimulation (unattended), [phone]- Electrical stimulation (manual), [id number]- Ultrasound, 29562- Traction (mechanical), Patient/Family education, Balance training, Stair training, Taping, Dry Needling, Joint mobilization, Joint manipulation, Spinal manipulation, Spinal mobilization, DME instructions, Cryotherapy, and Moist heat  PLAN FOR NEXT SESSION: Attempt leg press, hamstring and quad strengthening using OMEGA machine. Progress clam shells to side lying hip abduction.   Marge Shed PT, DPT  [facility] [address] [place], [address] Phone: [phone]   Fax:  [phone]

## 2023-07-03 ENCOUNTER — Ambulatory Visit: Admitting: Physical Therapy

## 2023-07-04 ENCOUNTER — Ambulatory Visit: Admitting: Physical Therapy

## 2023-07-04 DIAGNOSIS — R262 Difficulty in walking, not elsewhere classified: Secondary | ICD-10-CM

## 2023-07-04 DIAGNOSIS — M25552 Pain in left hip: Secondary | ICD-10-CM

## 2023-07-04 NOTE — Therapy (Signed)
 OUTPATIENT PHYSICAL THERAPY LOWER EXTREMITY TREATMENT    Patient Name: Terri Melendez MRN: [id number] DOB:May 16, 1950, 73 y.o., female Today's Date: 07/04/2023  END OF SESSION:  PT End of Session - 07/04/23 1648     Visit Number 5    Number of Visits 20    Date for PT Re-Evaluation 08/27/23    Authorization Type [employer] 2025    Authorization - Visit Number 5    Authorization - Number of Visits 20    Progress Note Due on Visit 10    PT Start Time 1600    PT Stop Time 1645    PT Time Calculation (min) 45 min    Activity Tolerance Patient tolerated treatment well    Behavior During Therapy WFL for tasks assessed/performed               Past Medical History:  Diagnosis Date   Actinic keratosis    Cancer (HCC)    basal cell on face in 2000 or 2001   Hx of basal cell carcinoma 2002   right cheek    Hx of basal cell carcinoma 02/11/2020   glabella, Mohs with Dr. Debrah Fan 06/03/2020   Hypertension    Osteopenia    Sinus congestion    Past Surgical History:  Procedure Laterality Date   BASAL CELL CARCINOMA EXCISION     TONSILLECTOMY AND ADENOIDECTOMY     Patient Active Problem List   Diagnosis Date Noted   Trochanteric bursitis of left hip 04/30/2023   Plantar fasciitis of left foot 03/28/2022   Anemia 01/10/2022   Allergic rhinitis 06/17/2020   GERD with esophagitis 06/17/2020   Personal history of other malignant neoplasm of skin 06/03/2020   Polyarthralgia 12/15/2019   Thoracic aortic atherosclerosis (HCC) 12/15/2019   Coronary atherosclerosis due to calcified coronary lesion (CODE) 12/15/2019   Encounter for preventive health examination 12/13/2018   History of shingles 05/04/2018   Non-functional thyroid  nodule 05/04/2018   Essential hypertension 10/22/2017   Advanced care planning/counseling discussion 10/22/2017   Hypercholesteremia 05/14/2016   OSA on CPAP 10/13/2014    PCP: Dr. Creta Dolin   REFERRING PROVIDER: Dr. Creta Dolin    REFERRING DIAG: Left Hip Pain   THERAPY DIAG:  Pain in left hip  Difficulty in walking, not elsewhere classified  Rationale for Evaluation and Treatment: Rehabilitation  ONSET DATE: 03/09/2023   SUBJECTIVE:   SUBJECTIVE STATEMENT: Pt saw orthopedist who gave her a steroid injection and it has helped to reduce the pain. She was also given a compression sleeve and she still has a Baker's cyst.   PERTINENT HISTORY: Pt reports that she started to experience lateral left hip pain a few months ago. She had imaging which showed mild arthritis in her left hip. She experiences most of her pain when walking. She recently twisted her right knee when changing directions and she had it examined and torn ligaments were ruled out. Pt reports having an upcoming trip where she will need to get into and off of a bus.     PAIN:  Are you having pain? Yes: NPRS scale: 2-3/10 Pain location: Anterior surface of right knee   Pain description: Throbbing.  Aggravating factors: Walking  Relieving factors: Prednisone  and sitting still    PRECAUTIONS: None  RED FLAGS: None   WEIGHT BEARING RESTRICTIONS: No  FALLS:  Has patient fallen in last 6 months? No  LIVING ENVIRONMENT: Lives with: lives with their spouse Lives in: House/apartment Stairs: Yes: Internal: 13 steps; on  right going up and on left going up and External: 1 steps; none Has following equipment at home: None  OCCUPATION: Retired    PLOF: Independent  PATIENT GOALS: To feel less pain in left hip so she can enjoy her upcoming bus trip   NEXT MD VISIT: None scheduled   OBJECTIVE:  Note: Objective measures were completed at Evaluation unless otherwise noted.  LEFS 58% (46/80)  DIAGNOSTIC FINDINGS: CLINICAL DATA:  Bilateral hip pain for 3 months   EXAM: DG HIP (WITH OR WITHOUT PELVIS) 2-3V LEFT   COMPARISON:  None available   FINDINGS: No fracture or dislocation. Mild degenerative changes of the visualized lower lumbar  spine and bilateral sacroiliac joints. Mild degenerative changes of the left hip. Soft tissues are unremarkable.   IMPRESSION: Mild left hip osteoarthrosis.     Electronically Signed   By: Elester Grim M.D.   On: 05/19/2023 07:28  PATIENT SURVEYS:  LEFS 58% and 46/80   COGNITION: Overall cognitive status: Within functional limits for tasks assessed     SENSATION: WFL  MUSCLE LENGTH: Hamstrings: Right 90 deg; Left 80 deg   POSTURE: No Significant postural limitations  PALPATION: Greater Trochanter of left hip TTP   LOWER EXTREMITY ROM:  Active ROM Right eval Left eval  Hip flexion    Hip extension    Hip abduction    Hip adduction    Hip internal rotation    Hip external rotation    Knee flexion 128* 130  Knee extension 0 0  Ankle dorsiflexion    Ankle plantarflexion    Ankle inversion    Ankle eversion     (Blank rows = not tested)  LOWER EXTREMITY MMT:  MMT Right eval Left eval  Hip flexion 4 4  Hip extension 4 4  Hip abduction 4 4-  Hip adduction 4 4  Hip internal rotation    Hip external rotation    Knee flexion 4 4  Knee extension 4 4  Ankle dorsiflexion    Ankle plantarflexion    Ankle inversion    Ankle eversion     (Blank rows = not tested)  LOWER EXTREMITY SPECIAL TESTS:  Hip special tests: Portia Brittle (FABER) test: positive , Ely's test: negative, and Anterior hip impingement test: negative   FUNCTIONAL TESTS:  6 minute walk test: NT Squats: NT   GAIT: Distance walked:  Assistive device utilized: None Level of assistance: Complete Independence Comments: No gait abnormalities noted, donning right knee brace                                                                                                                                 TREATMENT DATE:   07/04/23: THEREX   Standing Marches with red band 1 x 10   Standing Marches with #3 AW 2 x 10  Standing Marches with #5 AW 2 x 10  Standing Marches with green band 2 x 10  Side  Lying Hip Abduction 3 x 10   -min VC to maintain neutral hip   Figure 4 Bridge with #5 DB hold 2 x 10  -min VC to increase height of bridge   07/02/23: Donning right knee brace   TM at 1.0 mph for 5 min   -Stiff knee gait on RLE, decreased knee flexion  Squats x 5    Knee Circumference R/L 16"/15.25" Knee Ext R/L AROM 0/0  Knee Flex R/L AROM 128/130 deg Mini-Squat 2 x 10  -Pt reports lateral right knee pain   Supine Bridges 1 x 10  Supine Bridges with hip adduction isometric ball squeeze 1 x 10  Figure Bridges 2 x 10       06/27/23:  THEREX   Nu-Step with seat at 7 for 5 min level 3   Standing Marches with BUE support 2 x 10 Standing Marches with 1 UE support 2 x 10   Standing Marches with no UE support 2 x 10  Standing Marches with no UE support and red band for resistance 2 x 10  Sit to Stand without UE support 1 x 10   -Pt reports increased pain on lateral joint line of left knee and swelling on posterior side of knee  Sit to Sand without UE support and without knee brace 1 x 10  -min VC to decrease speed of eccentric phase; about same level of pain in lateral joint line of right knee as with brace on. No pain in left hip   PHYSICAL PERFORMANCE  : 1,250 ft HR 82   McMurry's: Negative on LLE   Palpable : Right knee lateral joint line TTP and Baker's cyst in popliteal fossa of right knee  Thessaly's Test: Negative  Forced Hyperextension: Negative    06/25/23: THEREX   Nu-Step with seat and arms at 7 for 5 min level 3   LLE clam shell with red band 1 x 10  LLE clam shell with green band 1 x 10  Seated ER stretch on LLE 3 x 60 sec    THERAC Step Up on LLE on 4 inch step  1 x 10  Step Up on LLE on 6 inch step 1 x 10  Step Down on RLE on 6 inch step 1 x 10    PHYSICAL PERFORMANCE  Partial Squat x 5- Symmetrical loading with knees remaining behind toes Negotiating Stairs- Step over step with BUE support -min VC to go up stairs with good leg (LLE) and down with bad  (RLE)    PATIENT EDUCATION:  Education details: Form and technique for correct performance of exercise. Explanation of potential differentials for left hip pain.  Person educated: Patient Education method: Explanation, Demonstration, Verbal cues, and Handouts Education comprehension: verbalized understanding, returned demonstration, and verbal cues required  HOME EXERCISE PROGRAM: Access Code: [email or web address] URL: [email or web address] Date: 07/02/2023 Prepared by: Marge Shed  Exercises - Seated Hip External Rotation Stretch  - 1 x daily - 7 x weekly - 3 reps - 30-60 sec  hold - Seated Hamstring Stretch  - 1 x daily - 7 x weekly - 3 reps - 60 sec hold - Prone Quadriceps Stretch with Strap  - 1 x daily - 7 x weekly - 3 reps - 60 sec  hold - Clam with Resistance  - 3-4 x weekly - 3 sets - 10 reps - Marching with Resistance  - 3-4 x weekly - 3 sets - 10 reps - Figure 4 Bridge  - 3-4 x  weekly - 3 sets - 10 reps  ASSESSMENT:  CLINICAL IMPRESSION: Pt shows improvement in right knee pain and she was not limited during session by right knee or left hip pain. She demonstrates an increase in hip strength with ability to perform progressed hip strengthening exercises. She will continue to benefit from skilled PT to address these aforementioned deficits to return to walking longer distances without pain and discomfort and to return to walking to maintain health and fitness.    OBJECTIVE IMPAIRMENTS: decreased strength, impaired flexibility, and pain.   ACTIVITY LIMITATIONS: carrying, lifting, bending, standing, squatting, stairs, and locomotion level  PARTICIPATION LIMITATIONS: shopping, community activity, and yard work  PERSONAL FACTORS: 3+ comorbidities: HTN, HLD, and Anemia are also affecting patient's functional outcome.   REHAB POTENTIAL: Good  CLINICAL DECISION MAKING: Stable/uncomplicated  EVALUATION COMPLEXITY: Low   GOALS: Goals reviewed with patient? No  SHORT  TERM GOALS: Target date: 07/02/2023  Patient will demonstrate undestanding of home exercise plan by performing exercises correctly with evidence of good carry over with min to no verbal or tactile cues .   Baseline: NT 06/25/23 Able to perform independently  Goal status: ACHIEVED      LONG TERM GOALS: Target date: 08/27/2023  Patient will show an improvement in the hip disability and osteoarthritis outcome score by >=9 pts as evidence of significant change in score and improvement in right hip function.  Baseline: 46/80 (58%) Goal status: ONGOING    2.  Pt will increase by at least 7m (127ft) in order to demonstrate clinically significant improvement in cardiopulmonary endurance and community ambulation Baseline: 1,250 ft  Goal status: ONGOING    3.  Patient will improve left hip strength by 1/3 grade MMT (4- to 4) for improved left hip stability and to improve left hip function.  Baseline: All measurements on left hip Flex 4, Abd 4-, Ext 4  Goal status: ONGOING     PLAN:  PT FREQUENCY: 1-2x/week  PT DURATION: 10 weeks  PLANNED INTERVENTIONS: 97164- PT Re-evaluation, 97750- Physical Performance Testing, 97110-Therapeutic exercises, 97530- Therapeutic activity, [id number]- Neuromuscular re-education, 97535- Self Care, 45409- Manual therapy, [id number]- Gait training, [phone]- Orthotic/Prosthetic subsequent, [id number]- Aquatic Therapy, Y7829- Electrical stimulation (unattended), [phone]- Electrical stimulation (manual), [id number]- Ultrasound, 08657- Traction (mechanical), Patient/Family education, Balance training, Stair training, Taping, Dry Needling, Joint mobilization, Joint manipulation, Spinal manipulation, Spinal mobilization, DME instructions, Cryotherapy, and Moist heat  PLAN FOR NEXT SESSION: Attempt mini-squat if right knee is still low in pain. OMEGA Leg Press and Matrix 4 way machine  Marge Shed PT, DPT  [facility] [address],  [place], [address] Phone: [phone]   Fax:  [phone]

## 2023-07-09 ENCOUNTER — Ambulatory Visit: Admitting: Physical Therapy

## 2023-07-09 DIAGNOSIS — M25552 Pain in left hip: Secondary | ICD-10-CM

## 2023-07-09 DIAGNOSIS — R262 Difficulty in walking, not elsewhere classified: Secondary | ICD-10-CM

## 2023-07-09 NOTE — Therapy (Signed)
 OUTPATIENT PHYSICAL THERAPY LOWER EXTREMITY TREATMENT    Patient Name: Terri Melendez MRN: [id number] DOB:1951-01-13, 73 y.o., female Today's Date: 07/09/2023  END OF SESSION:  PT End of Session - 07/09/23 1510     Visit Number 6    Number of Visits 20    Date for PT Re-Evaluation 08/27/23    Authorization Type [employer] 2025    Authorization - Visit Number 6    Authorization - Number of Visits 20    Progress Note Due on Visit 10    PT Start Time 1431    PT Stop Time 1510    PT Time Calculation (min) 39 min    Activity Tolerance Patient tolerated treatment well    Behavior During Therapy WFL for tasks assessed/performed                Past Medical History:  Diagnosis Date   Actinic keratosis    Cancer (HCC)    basal cell on face in 2000 or 2001   Hx of basal cell carcinoma 2002   right cheek    Hx of basal cell carcinoma 02/11/2020   glabella, Mohs with Dr. Debrah Fan 06/03/2020   Hypertension    Osteopenia    Sinus congestion    Past Surgical History:  Procedure Laterality Date   BASAL CELL CARCINOMA EXCISION     TONSILLECTOMY AND ADENOIDECTOMY     Patient Active Problem List   Diagnosis Date Noted   Trochanteric bursitis of left hip 04/30/2023   Plantar fasciitis of left foot 03/28/2022   Anemia 01/10/2022   Allergic rhinitis 06/17/2020   GERD with esophagitis 06/17/2020   Personal history of other malignant neoplasm of skin 06/03/2020   Polyarthralgia 12/15/2019   Thoracic aortic atherosclerosis (HCC) 12/15/2019   Coronary atherosclerosis due to calcified coronary lesion (CODE) 12/15/2019   Encounter for preventive health examination 12/13/2018   History of shingles 05/04/2018   Non-functional thyroid  nodule 05/04/2018   Essential hypertension 10/22/2017   Advanced care planning/counseling discussion 10/22/2017   Hypercholesteremia 05/14/2016   OSA on CPAP 10/13/2014    PCP: Dr. Creta Dolin   REFERRING PROVIDER: Dr. Creta Dolin    REFERRING DIAG: Left Hip Pain   THERAPY DIAG:  No diagnosis found.  Rationale for Evaluation and Treatment: Rehabilitation  ONSET DATE: 03/09/2023   SUBJECTIVE:   SUBJECTIVE STATEMENT: Pt reports that she has not felt a significant increase in her right knee pain since last session and she continues to experience little to no left hip pain.   PERTINENT HISTORY: Pt reports that she started to experience lateral left hip pain a few months ago. She had imaging which showed mild arthritis in her left hip. She experiences most of her pain when walking. She recently twisted her right knee when changing directions and she had it examined and torn ligaments were ruled out. Pt reports having an upcoming trip where she will need to get into and off of a bus.     PAIN:  Are you having pain? Yes: NPRS scale: 2-3/10 Pain location: medial joint line of right knee   Pain description: Throbbing.  Aggravating factors: Walking  Relieving factors: Prednisone  and sitting still    PRECAUTIONS: None  RED FLAGS: None   WEIGHT BEARING RESTRICTIONS: No  FALLS:  Has patient fallen in last 6 months? No  LIVING ENVIRONMENT: Lives with: lives with their spouse Lives in: House/apartment Stairs: Yes: Internal: 13 steps; on right going up and on left going up  and External: 1 steps; none Has following equipment at home: None  OCCUPATION: Retired    PLOF: Independent  PATIENT GOALS: To feel less pain in left hip so she can enjoy her upcoming bus trip   NEXT MD VISIT: None scheduled   OBJECTIVE:  Note: Objective measures were completed at Evaluation unless otherwise noted.  LEFS 58% (46/80)  DIAGNOSTIC FINDINGS: CLINICAL DATA:  Bilateral hip pain for 3 months   EXAM: DG HIP (WITH OR WITHOUT PELVIS) 2-3V LEFT   COMPARISON:  None available   FINDINGS: No fracture or dislocation. Mild degenerative changes of the visualized lower lumbar spine and bilateral sacroiliac joints.  Mild degenerative changes of the left hip. Soft tissues are unremarkable.   IMPRESSION: Mild left hip osteoarthrosis.     Electronically Signed   By: Elester Grim M.D.   On: 05/19/2023 07:28  PATIENT SURVEYS:  LEFS 58% and 46/80   COGNITION: Overall cognitive status: Within functional limits for tasks assessed     SENSATION: WFL  MUSCLE LENGTH: Hamstrings: Right 90 deg; Left 80 deg   POSTURE: No Significant postural limitations  PALPATION: Greater Trochanter of left hip TTP   LOWER EXTREMITY ROM:  Active ROM Right eval Left eval  Hip flexion    Hip extension    Hip abduction    Hip adduction    Hip internal rotation    Hip external rotation    Knee flexion 128* 130  Knee extension 0 0  Ankle dorsiflexion    Ankle plantarflexion    Ankle inversion    Ankle eversion     (Blank rows = not tested)  LOWER EXTREMITY MMT:  MMT Right eval Left eval  Hip flexion 4 4  Hip extension 4 4  Hip abduction 4 4-  Hip adduction 4 4  Hip internal rotation    Hip external rotation    Knee flexion 4 4  Knee extension 4 4  Ankle dorsiflexion    Ankle plantarflexion    Ankle inversion    Ankle eversion     (Blank rows = not tested)  LOWER EXTREMITY SPECIAL TESTS:  Hip special tests: Portia Brittle (FABER) test: positive , Ely's test: negative, and Anterior hip impingement test: negative   FUNCTIONAL TESTS:  6 minute walk test: NT Squats: NT   GAIT: Distance walked:  Assistive device utilized: None Level of assistance: Complete Independence Comments: No gait abnormalities noted, donning right knee brace                                                                                                                                 TREATMENT DATE:   07/09/23: THEREX   Matrix Recumbent Bicycle with seat at 7 for 5 min Partial Squats with taps 1 x 10 while donning right knee brace  Partial Squats with taps 1 x 10 while not donning right knee brace   Partial Squats  with taps 1 x 10  while not donning right knee brace and holding #5 DB to 90 deg flexion  OMEGA HS Curls # 25 1 x 3 -Pt unable to perform   OMEGA HS Curls #15 2 x 10  Supine Straight Leg with ER 3 x 10  Palpation: Baker's Cyst RLE still noticeable  07/04/23: THEREX   Standing Marches with red band 1 x 10   Standing Marches with #3 AW 2 x 10  Standing Marches with #5 AW 2 x 10  Standing Marches with green band 2 x 10  Side Lying Hip Abduction 3 x 10   -min VC to maintain neutral hip   Figure 4 Bridge with #5 DB hold 2 x 10  -min VC to increase height of bridge   07/02/23: Donning right knee brace   TM at 1.0 mph for 5 min   -Stiff knee gait on RLE, decreased knee flexion  Squats x 5    Knee Circumference R/L 16"/15.25" Knee Ext R/L AROM 0/0  Knee Flex R/L AROM 128/130 deg Mini-Squat 2 x 10  -Pt reports lateral right knee pain   Supine Bridges 1 x 10  Supine Bridges with hip adduction isometric ball squeeze 1 x 10  Figure Bridges 2 x 10     06/27/23:  THEREX   Nu-Step with seat at 7 for 5 min level 3   Standing Marches with BUE support 2 x 10 Standing Marches with 1 UE support 2 x 10   Standing Marches with no UE support 2 x 10  Standing Marches with no UE support and red band for resistance 2 x 10  Sit to Stand without UE support 1 x 10   -Pt reports increased pain on lateral joint line of left knee and swelling on posterior side of knee  Sit to Sand without UE support and without knee brace 1 x 10  -min VC to decrease speed of eccentric phase; about same level of pain in lateral joint line of right knee as with brace on. No pain in left hip   PHYSICAL PERFORMANCE  : 1,250 ft HR 82   McMurry's: Negative on LLE   Palpable : Right knee lateral joint line TTP and Baker's cyst in popliteal fossa of right knee  Thessaly's Test: Negative  Forced Hyperextension: Negative     PATIENT EDUCATION:  Education details: Form and technique for correct performance of exercise.  Explanation of potential differentials for left hip pain.  Person educated: Patient Education method: Explanation, Demonstration, Verbal cues, and Handouts Education comprehension: verbalized understanding, returned demonstration, and verbal cues required  HOME EXERCISE PROGRAM: Access Code: [email or web address] URL: [email or web address] Date: 07/02/2023 Prepared by: Marge Shed  Exercises - Seated Hip External Rotation Stretch  - 1 x daily - 7 x weekly - 3 reps - 30-60 sec  hold - Seated Hamstring Stretch  - 1 x daily - 7 x weekly - 3 reps - 60 sec hold - Prone Quadriceps Stretch with Strap  - 1 x daily - 7 x weekly - 3 reps - 60 sec  hold - Clam with Resistance  - 3-4 x weekly - 3 sets - 10 reps - Marching with Resistance  - 3-4 x weekly - 3 sets - 10 reps - Figure 4 Bridge  - 3-4 x weekly - 3 sets - 10 reps  ASSESSMENT:  CLINICAL IMPRESSION: Pt shows a decrease in right knee pain compared to last session with ability to perform weight bearing exercises without pain and discomfort. She  was able to perform these exercises without the use of her brace. Pt continues to have Baker's cyst on right knee and it does show some decrease in size since last session and it is not painful for patient. She will continue to benefit from skilled PT to address these aforementioned deficits to return to walking longer distances without pain and discomfort and to return to walking to maintain health and fitness.   OBJECTIVE IMPAIRMENTS: decreased strength, impaired flexibility, and pain.   ACTIVITY LIMITATIONS: carrying, lifting, bending, standing, squatting, stairs, and locomotion level  PARTICIPATION LIMITATIONS: shopping, community activity, and yard work  PERSONAL FACTORS: 3+ comorbidities: HTN, HLD, and Anemia are also affecting patient's functional outcome.   REHAB POTENTIAL: Good  CLINICAL DECISION MAKING: Stable/uncomplicated  EVALUATION COMPLEXITY: Low   GOALS: Goals reviewed  with patient? No  SHORT TERM GOALS: Target date: 07/02/2023  Patient will demonstrate undestanding of home exercise plan by performing exercises correctly with evidence of good carry over with min to no verbal or tactile cues .   Baseline: NT 06/25/23 Able to perform independently  Goal status: ACHIEVED      LONG TERM GOALS: Target date: 08/27/2023  Patient will show an improvement in the hip disability and osteoarthritis outcome score by >=9 pts as evidence of significant change in score and improvement in right hip function.  Baseline: 46/80 (58%) Goal status: ONGOING    2.  Pt will increase by at least 60m (151ft) in order to demonstrate clinically significant improvement in cardiopulmonary endurance and community ambulation Baseline: 1,250 ft  Goal status: ONGOING    3.  Patient will improve left hip strength by 1/3 grade MMT (4- to 4) for improved left hip stability and to improve left hip function.  Baseline: All measurements on left hip Flex 4, Abd 4-, Ext 4  Goal status: ONGOING     PLAN:  PT FREQUENCY: 1-2x/week  PT DURATION: 10 weeks  PLANNED INTERVENTIONS: 97164- PT Re-evaluation, 97750- Physical Performance Testing, 97110-Therapeutic exercises, 97530- Therapeutic activity, [id number]- Neuromuscular re-education, 97535- Self Care, 16109- Manual therapy, [id number]- Gait training, [phone]- Orthotic/Prosthetic subsequent, [id number]- Aquatic Therapy, U9811- Electrical stimulation (unattended), [phone]- Electrical stimulation (manual), [id number]- Ultrasound, 29562- Traction (mechanical), Patient/Family education, Balance training, Stair training, Taping, Dry Needling, Joint mobilization, Joint manipulation, Spinal manipulation, Spinal mobilization, DME instructions, Cryotherapy, and Moist heat  PLAN FOR NEXT SESSION: Reassess goals. OMEGA leg press and attempt OMEGA knee extension. TRX Squat   Marge Shed PT, DPT  [facility] [address] [place], [address] Phone: [phone]   Fax:  [phone]

## 2023-07-17 ENCOUNTER — Encounter: Admitting: Physical Therapy

## 2023-07-23 ENCOUNTER — Encounter: Admitting: Physical Therapy

## 2023-07-25 ENCOUNTER — Ambulatory Visit: Admitting: Physical Therapy

## 2023-07-25 DIAGNOSIS — M25552 Pain in left hip: Secondary | ICD-10-CM | POA: Diagnosis not present

## 2023-07-25 DIAGNOSIS — R262 Difficulty in walking, not elsewhere classified: Secondary | ICD-10-CM

## 2023-07-25 NOTE — Therapy (Signed)
 OUTPATIENT PHYSICAL THERAPY LOWER EXTREMITY DISCHARGE    Patient Name: Terri Melendez MRN: [id number] DOB:Jul 07, 1950, 73 y.o., female Today's Date: 07/25/2023  END OF SESSION:  PT End of Session - 07/25/23 1739     Visit Number 7    Number of Visits 20    Date for PT Re-Evaluation 08/27/23    Authorization Type [employer] 2025    Authorization - Visit Number 7    Authorization - Number of Visits 20    Progress Note Due on Visit 10    PT Start Time 1735    PT Stop Time 1815    PT Time Calculation (min) 40 min    Activity Tolerance Patient tolerated treatment well    Behavior During Therapy [facility] for tasks assessed/performed                Past Medical History:  Diagnosis Date   Actinic keratosis    Cancer (HCC)    basal cell on face in 2000 or 2001   Hx of basal cell carcinoma 2002   right cheek    Hx of basal cell carcinoma 02/11/2020   glabella, Mohs with Dr. Debrah Fan 06/03/2020   Hypertension    Osteopenia    Sinus congestion    Past Surgical History:  Procedure Laterality Date   BASAL CELL CARCINOMA EXCISION     TONSILLECTOMY AND ADENOIDECTOMY     Patient Active Problem List   Diagnosis Date Noted   Trochanteric bursitis of left hip 04/30/2023   Plantar fasciitis of left foot 03/28/2022   Anemia 01/10/2022   Allergic rhinitis 06/17/2020   GERD with esophagitis 06/17/2020   Personal history of other malignant neoplasm of skin 06/03/2020   Polyarthralgia 12/15/2019   Thoracic aortic atherosclerosis (HCC) 12/15/2019   Coronary atherosclerosis due to calcified coronary lesion (CODE) 12/15/2019   Encounter for preventive health examination 12/13/2018   History of shingles 05/04/2018   Non-functional thyroid  nodule 05/04/2018   Essential hypertension 10/22/2017   Advanced care planning/counseling discussion 10/22/2017   Hypercholesteremia 05/14/2016   OSA on CPAP 10/13/2014    PCP: Dr. Creta Dolin   REFERRING PROVIDER: Dr. Creta Dolin    REFERRING DIAG: Left Hip Pain   THERAPY DIAG:  Pain in left hip  Difficulty in walking, not elsewhere classified  Rationale for Evaluation and Treatment: Rehabilitation  ONSET DATE: 03/09/2023   SUBJECTIVE:   SUBJECTIVE STATEMENT: Pt reports that she has not felt a significant increase in her right knee pain since last session and she continues to experience little to no left hip pain.   PERTINENT HISTORY: Pt reports that she started to experience lateral left hip pain a few months ago. She had imaging which showed mild arthritis in her left hip. She experiences most of her pain when walking. She recently twisted her right knee when changing directions and she had it examined and torn ligaments were ruled out. Pt reports having an upcoming trip where she will need to get into and off of a bus.     PAIN:  Are you having pain? Yes: NPRS scale: 2-3/10 Pain location: medial joint line of right knee   Pain description: Throbbing.  Aggravating factors: Walking  Relieving factors: Prednisone  and sitting still    PRECAUTIONS: None  RED FLAGS: None   WEIGHT BEARING RESTRICTIONS: No  FALLS:  Has patient fallen in last 6 months? No  LIVING ENVIRONMENT: Lives with: lives with their spouse Lives in: House/apartment Stairs: Yes: Internal: 13 steps; on  right going up and on left going up and External: 1 steps; none Has following equipment at home: None  OCCUPATION: Retired    PLOF: Independent  PATIENT GOALS: To feel less pain in left hip so she can enjoy her upcoming bus trip   NEXT MD VISIT: None scheduled   OBJECTIVE:  Note: Objective measures were completed at Evaluation unless otherwise noted.  LEFS 58% (46/80)  DIAGNOSTIC FINDINGS: CLINICAL DATA:  Bilateral hip pain for 3 months   EXAM: DG HIP (WITH OR WITHOUT PELVIS) 2-3V LEFT   COMPARISON:  None available   FINDINGS: No fracture or dislocation. Mild degenerative changes of the visualized lower lumbar spine  and bilateral sacroiliac joints. Mild degenerative changes of the left hip. Soft tissues are unremarkable.   IMPRESSION: Mild left hip osteoarthrosis.     Electronically Signed   By: Elester Grim M.D.   On: 05/19/2023 07:28  PATIENT SURVEYS:  LEFS 58% and 46/80   COGNITION: Overall cognitive status: Within functional limits for tasks assessed     SENSATION: WFL  MUSCLE LENGTH: Hamstrings: Right 90 deg; Left 80 deg   POSTURE: No Significant postural limitations  PALPATION: Greater Trochanter of left hip TTP   LOWER EXTREMITY ROM:  Active ROM Right eval Left eval  Hip flexion    Hip extension    Hip abduction    Hip adduction    Hip internal rotation    Hip external rotation    Knee flexion 128* 130  Knee extension 0 0  Ankle dorsiflexion    Ankle plantarflexion    Ankle inversion    Ankle eversion     (Blank rows = not tested)  LOWER EXTREMITY MMT:  MMT Right eval Left eval  Hip flexion 4 4  Hip extension 4 4  Hip abduction 4 4-  Hip adduction 4 4  Hip internal rotation    Hip external rotation    Knee flexion 4 4  Knee extension 4 4  Ankle dorsiflexion    Ankle plantarflexion    Ankle inversion    Ankle eversion     (Blank rows = not tested)  LOWER EXTREMITY SPECIAL TESTS:  Hip special tests: Portia Brittle (FABER) test: positive , Ely's test: negative, and Anterior hip impingement test: negative   FUNCTIONAL TESTS:  6 minute walk test: NT Squats: NT   GAIT: Distance walked:  Assistive device utilized: None Level of assistance: Complete Independence Comments: No gait abnormalities noted, donning right knee brace                                                                                                                                 TREATMENT DATE:   07/25/23: THEREX   Knee Circumference R/L 15.5"/15"  Right Knee Flexion 135  with overpressure- no increase in pain  Right Knee Ext 0 deg  with overpressure-no increase in pain  Squats  1 x 10  -Increase in medial  joint line of right knee   20 ft over ground ambulation x 4  -No gait deficits noted; pt reporting intermittent twinges of left medial knee pain  Medial joint line of left knee TTP and sizable Baker's cyst in popliteal fossa of left knee  Left Hip MMT -Hip Flex 4  -Hip Ext 4  -Hip Abd 4  SELF CARE HOME MANAGEMENT  Education about home exercise plan and progressions for exercises. Continue to rest ice compress and elevate to reduce swelling in right knee    07/09/23: THEREX   Matrix Recumbent Bicycle with seat at 7 for 5 min Partial Squats with taps 1 x 10 while donning right knee brace  Partial Squats with taps 1 x 10 while not donning right knee brace   Partial Squats with taps 1 x 10 while not donning right knee brace and holding #5 DB to 90 deg flexion  OMEGA HS Curls # 25 1 x 3 -Pt unable to perform   OMEGA HS Curls #15 2 x 10  Supine Straight Leg with ER 3 x 10  Palpation: Baker's Cyst RLE still noticeable  07/04/23: THEREX   Standing Marches with red band 1 x 10   Standing Marches with #3 AW 2 x 10  Standing Marches with #5 AW 2 x 10  Standing Marches with green band 2 x 10  Side Lying Hip Abduction 3 x 10   -min VC to maintain neutral hip   Figure 4 Bridge with #5 DB hold 2 x 10  -min VC to increase height of bridge   07/02/23: Donning right knee brace   TM at 1.0 mph for 5 min   -Stiff knee gait on RLE, decreased knee flexion  Squats x 5    Knee Circumference R/L 16"/15.25" Knee Ext R/L AROM 0/0  Knee Flex R/L AROM 128/130 deg Mini-Squat 2 x 10  -Pt reports lateral right knee pain   Supine Bridges 1 x 10  Supine Bridges with hip adduction isometric ball squeeze 1 x 10  Figure Bridges 2 x 10     PATIENT EDUCATION:  Education details: Form and technique for correct performance of exercise. Explanation of potential differentials for left hip pain.  Person educated: Patient Education method: Explanation, Demonstration, Verbal cues,  and Handouts Education comprehension: verbalized understanding, returned demonstration, and verbal cues required  HOME EXERCISE PROGRAM: Access Code: [email or web address] URL: [email or web address] Date: 07/25/2023 Prepared by: Marge Shed  Exercises - Seated Hip External Rotation Stretch  - 1 x daily - 7 x weekly - 3 reps - 30-60 sec  hold - Seated Hamstring Stretch  - 1 x daily - 7 x weekly - 3 reps - 60 sec hold - Prone Quadriceps Stretch with Strap  - 1 x daily - 7 x weekly - 3 reps - 60 sec  hold - Sidelying Hip Abduction  - 3-4 x weekly - 3 sets - 10 reps - Figure 4 Bridge  - 3-4 x weekly - 3 sets - 10 reps   ASSESSMENT:  CLINICAL IMPRESSION: Pt has now met most goals for her left hip with improved strength and ongoing decreased hip pain. However, she continues to be limited by right knee pain with pain localized to medial joint line and increased swelling in right knee with presence of Baker's cyst. PT recommends that she return to orthopedist for further medical evaluation and treatment and begin a new plan of care for right knee if physical therapy is warranted and referral granted. Swelling  and medial joint line pain on right likely knee OA with insidious onset with worsening pain with stairs and walking. Pt is in agreement with this plan. PT continues to recommend rest, ice, compress and elevated and to wear brace when walking outside of home and over uneven surfaces. She was educated on home exercise plan to continue to manage left hip strength and functional gains.   OBJECTIVE IMPAIRMENTS: decreased strength, impaired flexibility, and pain.   ACTIVITY LIMITATIONS: carrying, lifting, bending, standing, squatting, stairs, and locomotion level  PARTICIPATION LIMITATIONS: shopping, community activity, and yard work  PERSONAL FACTORS: 3+ comorbidities: HTN, HLD, and Anemia are also affecting patient's functional outcome.   REHAB POTENTIAL: Good  CLINICAL DECISION MAKING:  Stable/uncomplicated  EVALUATION COMPLEXITY: Low   GOALS: Goals reviewed with patient? No  SHORT TERM GOALS: Target date: 07/02/2023  Patient will demonstrate undestanding of home exercise plan by performing exercises correctly with evidence of good carry over with min to no verbal or tactile cues .   Baseline: NT 06/25/23 Able to perform independently  Goal status: ACHIEVED      LONG TERM GOALS: Target date: 08/27/2023  Patient will show an improvement in the hip disability and osteoarthritis outcome score by >=9 pts as evidence of significant change in score and improvement in right hip function.  Baseline: 46/80 (58%) Goal status: DEFERRED   2.  Pt will increase by at least 69m (140ft) in order to demonstrate clinically significant improvement in cardiopulmonary endurance and community ambulation Baseline: 1,250 ft  Goal status: DEFERRED    3.  Patient will improve left hip strength by 1/3 grade MMT (4- to 4) for improved left hip stability and to improve left hip function.  Baseline: All measurements on left hip Flex 4, Abd 4-, Ext 4 07/25/23: Hip Flex L 4, Hip Abd L 4, Hip Ext L 4  Goal status: ACHIEVED      PLAN:  PT FREQUENCY: 1-2x/week  PT DURATION: 10 weeks  PLANNED INTERVENTIONS: 21308- PT Re-evaluation, 97750- Physical Performance Testing, 97110-Therapeutic exercises, 97530- Therapeutic activity, [id number]- Neuromuscular re-education, 97535- Self Care, 65784- Manual therapy, [phone]- Gait training, [phone]- Orthotic/Prosthetic subsequent, [phone]- Aquatic Therapy, [phone]- Electrical stimulation (unattended), [phone]- Electrical stimulation (manual), [id number]- Ultrasound, 40347- Traction (mechanical), Patient/Family education, Balance training, Stair training, Taping, Dry Needling, Joint mobilization, Joint manipulation, Spinal manipulation, Spinal mobilization, DME instructions, Cryotherapy, and Moist heat  PLAN FOR NEXT SESSION: Discharge from PT   Marge Shed PT, DPT  [facility] [address] [place], [address] Phone: [phone]   Fax:  [phone]

## 2023-07-30 ENCOUNTER — Ambulatory Visit (INDEPENDENT_AMBULATORY_CARE_PROVIDER_SITE_OTHER): Payer: Medicare HMO | Admitting: Internal Medicine

## 2023-07-30 ENCOUNTER — Encounter: Admitting: Physical Therapy

## 2023-07-30 ENCOUNTER — Encounter: Payer: Self-pay | Admitting: Internal Medicine

## 2023-07-30 ENCOUNTER — Ambulatory Visit: Admitting: Physical Therapy

## 2023-07-30 VITALS — BP 114/52 | HR 85 | Ht 64.0 in | Wt 143.0 lb

## 2023-07-30 DIAGNOSIS — M7062 Trochanteric bursitis, left hip: Secondary | ICD-10-CM

## 2023-07-30 DIAGNOSIS — I10 Essential (primary) hypertension: Secondary | ICD-10-CM | POA: Diagnosis not present

## 2023-07-30 DIAGNOSIS — D649 Anemia, unspecified: Secondary | ICD-10-CM | POA: Diagnosis not present

## 2023-07-30 DIAGNOSIS — E7800 Pure hypercholesterolemia, unspecified: Secondary | ICD-10-CM | POA: Diagnosis not present

## 2023-07-30 DIAGNOSIS — M25561 Pain in right knee: Secondary | ICD-10-CM | POA: Diagnosis not present

## 2023-07-30 DIAGNOSIS — D099 Carcinoma in situ, unspecified: Secondary | ICD-10-CM | POA: Insufficient documentation

## 2023-07-30 DIAGNOSIS — M1711 Unilateral primary osteoarthritis, right knee: Secondary | ICD-10-CM | POA: Insufficient documentation

## 2023-07-30 LAB — MICROALBUMIN / CREATININE URINE RATIO
Creatinine,U: 90.8 mg/dL
Microalb Creat Ratio: UNDETERMINED mg/g (ref 0.0–30.0)
Microalb, Ur: <0.7 mg/dL

## 2023-07-30 LAB — IBC + FERRITIN
Ferritin: 93 ng/mL (ref 10.0–291.0)
Iron: 80 ug/dL (ref 42–145)
Saturation Ratios: 25.4 % (ref 20.0–50.0)
TIBC: 315 ug/dL (ref 250.0–450.0)
Transferrin: 225 mg/dL (ref 212.0–360.0)

## 2023-07-30 LAB — COMPREHENSIVE METABOLIC PANEL WITH GFR
ALT: 13 U/L (ref 0–35)
AST: 17 U/L (ref 0–37)
Albumin: 4 g/dL (ref 3.5–5.2)
Alkaline Phosphatase: 79 U/L (ref 39–117)
BUN: 26 mg/dL — ABNORMAL HIGH (ref 6–23)
CO2: 29 meq/L (ref 19–32)
Calcium: 9.2 mg/dL (ref 8.4–10.5)
Chloride: 104 meq/L (ref 96–112)
Creatinine, Ser: 0.97 mg/dL (ref 0.40–1.20)
GFR: 59.05 mL/min — ABNORMAL LOW (ref >60.00)
Glucose, Bld: 61 mg/dL — ABNORMAL LOW (ref 70–99)
Potassium: 4 meq/L (ref 3.5–5.1)
Sodium: 139 meq/L (ref 135–145)
Total Bilirubin: 0.5 mg/dL (ref 0.2–1.2)
Total Protein: 6.4 g/dL (ref 6.0–8.3)

## 2023-07-30 LAB — LIPID PANEL
Cholesterol: 156 mg/dL (ref 0–200)
HDL: 58.7 mg/dL (ref >39.00)
LDL Cholesterol: 83 mg/dL (ref 0–99)
NonHDL: 96.9
Total CHOL/HDL Ratio: 3
Triglycerides: 70 mg/dL (ref 0.0–149.0)
VLDL: 14 mg/dL (ref 0.0–40.0)

## 2023-07-30 LAB — CBC WITH DIFFERENTIAL/PLATELET
Basophils Absolute: 0 10*3/uL (ref 0.0–0.1)
Basophils Relative: 0.7 % (ref 0.0–3.0)
Eosinophils Absolute: 0.2 10*3/uL (ref 0.0–0.7)
Eosinophils Relative: 2.6 % (ref 0.0–5.0)
HCT: 35.1 % — ABNORMAL LOW (ref 36.0–46.0)
Hemoglobin: 12 g/dL (ref 12.0–15.0)
Lymphocytes Relative: 23.1 % (ref 12.0–46.0)
Lymphs Abs: 1.3 10*3/uL (ref 0.7–4.0)
MCHC: 34.1 g/dL (ref 30.0–36.0)
MCV: 89.3 fl (ref 78.0–100.0)
Monocytes Absolute: 0.5 10*3/uL (ref 0.1–1.0)
Monocytes Relative: 9.1 % (ref 3.0–12.0)
Neutro Abs: 3.8 10*3/uL (ref 1.4–7.7)
Neutrophils Relative %: 64.5 % (ref 43.0–77.0)
Platelets: 280 10*3/uL (ref 150.0–400.0)
RBC: 3.93 Mil/uL (ref 3.87–5.11)
RDW: 13.7 % (ref 11.5–15.5)
WBC: 5.8 10*3/uL (ref 4.0–10.5)

## 2023-07-30 LAB — LDL CHOLESTEROL, DIRECT: Direct LDL: 82 mg/dL

## 2023-07-30 MED ORDER — MELOXICAM 15 MG PO TABS: 15.0000 mg | ORAL_TABLET | Freq: Every day | ORAL | 3 refills | Status: DC

## 2023-07-30 MED ORDER — TELMISARTAN-HCTZ 40-12.5 MG PO TABS: 1.0000 | ORAL_TABLET | Freq: Every day | ORAL | 1 refills | Status: DC

## 2023-07-30 NOTE — Assessment & Plan Note (Signed)
 NO IMPROVEMENT WITH STEROID INJECTION. TRIAL OF PHYSICAL THERAPY PREDNISONE  TAPER

## 2023-07-30 NOTE — Progress Notes (Signed)
 Subjective:  Patient ID: Terri Melendez, female    DOB: Apr 07, 1950  Age: 73 y.o. MRN: [id number]  CC: The primary encounter diagnosis was Essential hypertension. Diagnoses of Hypercholesteremia, Anemia, unspecified type, Acute pain of right knee, Squamous cell carcinoma in situ, Trochanteric bursitis of left hip, and Anterior knee pain, right were also pertinent to this visit.   HPI Terri Melendez presents for  Chief Complaint  Patient presents with   Medical Management of Chronic Issues    6 month follow up     1) WUJ:WJXBJYN is taking her medications as prescribed and notes no adverse effects.  Home BP readings have been done about once per week and are  generally < 130/80 .  She is avoiding added salt in her diet and walking regularly about 3 times per week for exercise  but activity has become limited by knee pain .   2) right knee pain: started acutely 6 weeks ago after twisting knee  during a turn.  Saw [facility],  received I/A cortisone injection on the anterolateral side of right knee iMay 6  by [facility] ,  and wearing 2 braces.  The steroid injection  gave her transient  Incomplete relief of pain  despite uing meloxicam   daily; her pain has  spread from the lateral to medial  side  . Aggravated by stepping down and sitting.   Had PT for hip , which has resolved,  and PT therapist started working on her knee but needs new referra .   Baker's cyst noted behind the knee   3) recent skin cancer biopsy reviwed.   Outpatient Medications Prior to Visit  Medication Sig Dispense Refill   atorvastatin  (LIPITOR) 20 MG tablet TAKE 1 TABLET BY MOUTH DAILY 90 tablet 3   cetirizine  (ZYRTEC ) 10 MG tablet Take 1 tablet (10 mg total) by mouth daily. 90 tablet 1   cholecalciferol  (VITAMIN D3) 25 MCG (1000 UNIT) tablet Take 1,000 Units by mouth daily.     clobetasol  cream (TEMOVATE ) 0.05 % Apply 1 Application topically 2 (two) times daily. Bid to rash/fissures on fingers until clear,  then prn flares, avoid face, groin, axilla 30 g 1   mometasone  (NASONEX ) 50 MCG/ACT nasal spray Place 2 sprays into the nose daily. 17 g 11   pantoprazole  (PROTONIX ) 20 MG tablet Take 1 tablet (20 mg total) by mouth daily. 90 tablet 1   meloxicam  (MOBIC ) 15 MG tablet Take 1 tablet (15 mg total) by mouth daily. 30 tablet 3   telmisartan -hydrochlorothiazide  (MICARDIS  HCT) 40-12.5 MG tablet Take 1 tablet by mouth daily. 90 tablet 1   DENTAGEL 1.1 % GEL dental gel  (Patient not taking: Reported on 07/30/2023)     fluorouracil  (EFUDEX ) 5 % cream Apply the cream twice per day to the area where the skin cancer was and a quarter inch around that area. Apply until the redness and irritation develop (usually occurs by day 7), then stop and allow it to heal. Protect the area from sunlight while it is healing with a hat or SPF30+ sunscreen. (Patient not taking: Reported on 07/30/2023) 40 g 1   predniSONE  (DELTASONE ) 10 MG tablet 6 tablets on Day 1 , then reduce by 1 tablet daily until gone (Patient not taking: Reported on 07/30/2023) 21 tablet 0   predniSONE  (DELTASONE ) 10 MG tablet 6 tablets on Day 1 , then reduce by 1 tablet daily until gone (Patient not taking: Reported on 07/30/2023) 21 tablet 0   No  facility-administered medications prior to visit.    Review of Systems;  Patient denies headache, fevers, malaise, unintentional weight loss, skin rash, eye pain, sinus congestion and sinus pain, sore throat, dysphagia,  hemoptysis , cough, dyspnea, wheezing, chest pain, palpitations, orthopnea, edema, abdominal pain, nausea, melena, diarrhea, constipation, flank pain, dysuria, hematuria, urinary  Frequency, nocturia, numbness, tingling, seizures,  Focal weakness, Loss of consciousness,  Tremor, insomnia, depression, anxiety, and suicidal ideation.      Objective:  BP (!) 114/52   Pulse 85   Ht 5\' 4"  (1.626 m)   Wt 143 lb (64.9 kg)   SpO2 98%   BMI 24.55 kg/m   BP Readings from Last 3 Encounters:  07/30/23  (!) 114/52  04/30/23 (!) 138/58  01/29/23 124/64    Wt Readings from Last 3 Encounters:  07/30/23 143 lb (64.9 kg)  04/30/23 144 lb 6.4 oz (65.5 kg)  01/29/23 142 lb 9.6 oz (64.7 kg)    Physical Exam Vitals reviewed.  Constitutional:      General: She is not in acute distress.    Appearance: Normal appearance. She is normal weight. She is not ill-appearing, toxic-appearing or diaphoretic.  HENT:     Head: Normocephalic.  Eyes:     General: No scleral icterus.       Right eye: No discharge.        Left eye: No discharge.     Conjunctiva/sclera: Conjunctivae normal.  Cardiovascular:     Rate and Rhythm: Normal rate and regular rhythm.     Heart sounds: Normal heart sounds.  Pulmonary:     Effort: Pulmonary effort is normal. No respiratory distress.     Breath sounds: Normal breath sounds.  Musculoskeletal:        General: Swelling present. Normal range of motion.     Right knee: Swelling present. Tenderness present over the medial joint line and lateral joint line.     Left knee: Normal.  Skin:    General: Skin is warm and dry.  Neurological:     General: No focal deficit present.     Mental Status: She is alert and oriented to person, place, and time. Mental status is at baseline.  Psychiatric:        Mood and Affect: Mood normal.        Behavior: Behavior normal.        Thought Content: Thought content normal.        Judgment: Judgment normal.    Lab Results  Component Value Date   HGBA1C 5.8 01/29/2023   HGBA1C 5.8 01/10/2022    Lab Results  Component Value Date   CREATININE 0.97 07/30/2023   CREATININE 0.87 01/29/2023   CREATININE 0.97 07/19/2022    Lab Results  Component Value Date   WBC 5.8 07/30/2023   HGB 12.0 07/30/2023   HCT 35.1 (L) 07/30/2023   PLT 280.0 07/30/2023   GLUCOSE 61 (L) 07/30/2023   CHOL 156 07/30/2023   TRIG 70.0 07/30/2023   HDL 58.70 07/30/2023   LDLDIRECT 82.0 07/30/2023   LDLCALC 83 07/30/2023   ALT 13 07/30/2023   AST  17 07/30/2023   NA 139 07/30/2023   K 4.0 07/30/2023   CL 104 07/30/2023   CREATININE 0.97 07/30/2023   BUN 26 (H) 07/30/2023   CO2 29 07/30/2023   TSH 0.76 01/29/2023   HGBA1C 5.8 01/29/2023   MICROALBUR <0.7 07/30/2023    MM 3D SCREENING MAMMOGRAM BILATERAL BREAST Result Date: 04/01/2023 CLINICAL DATA:  Screening.  EXAM: DIGITAL SCREENING BILATERAL MAMMOGRAM WITH TOMOSYNTHESIS AND CAD TECHNIQUE: Bilateral screening digital craniocaudal and mediolateral oblique mammograms were obtained. Bilateral screening digital breast tomosynthesis was performed. The images were evaluated with computer-aided detection. COMPARISON:  Previous exam(s). ACR Breast Density Category c: The breasts are heterogeneously dense, which may obscure small masses. FINDINGS: There are no findings suspicious for malignancy. IMPRESSION: No mammographic evidence of malignancy. A result letter of this screening mammogram will be mailed directly to the patient. RECOMMENDATION: Screening mammogram in one year. (Code:SM-B-01Y) BI-RADS CATEGORY  1: Negative. Electronically Signed   By: Alinda Apley M.D.   On: 04/01/2023 07:03    Assessment & Plan:  .Essential hypertension Assessment & Plan: Well controlled on current regimen of telmisartan  and hctz. , no changes today  Lab Results  Component Value Date   CREATININE 0.97 07/30/2023   Lab Results  Component Value Date   NA 139 07/30/2023   K 4.0 07/30/2023   CL 104 07/30/2023   CO2 29 07/30/2023     Orders: -     Comprehensive metabolic panel with GFR -     Microalbumin / creatinine urine ratio  Hypercholesteremia -     Lipid panel -     LDL cholesterol, direct  Anemia, unspecified type -     CBC with Differential/Platelet -     Fecal occult blood, imunochemical -     Immunofixation interpretive, urine -     Protein electrophoresis, serum -     IBC + Ferritin -     Fecal occult blood, imunochemical; Future -     Immunofixation interpretive, urine;  Future -     Protein electrophoresis, serum; Future  Acute pain of right knee -     Ambulatory referral to Physical Therapy  Squamous cell carcinoma in situ Assessment & Plan: Diagnosed by biopsy of an AK  on the left side of nose in January    Trochanteric bursitis of left hip Assessment & Plan: Resolved /   Anterior knee pain, right Assessment & Plan: NO IMPROVEMENT WITH STEROID INJECTION. TRIAL OF PHYSICAL THERAPY PREDNISONE  TAPER     Other orders -     Meloxicam ; Take 1 tablet (15 mg total) by mouth daily.  Dispense: 30 tablet; Refill: 3 -     Telmisartan -HCTZ; Take 1 tablet by mouth daily.  Dispense: 90 tablet; Refill: 1     I spent 34 minutes on the day of this face to face encounter reviewing patient's  most recent visit with ORTHOPEDICS, dermatology and   physical therAPY,   prior relevant surgical and non surgical procedures, recent  labs and imaging studies, counseling on pain  management,  reviewing the assessment and plan with patient, and post visit ordering and reviewing of  diagnostics and therapeutics with patient  .   Follow-up: No follow-ups on file.   Thersia Flax, MD

## 2023-07-30 NOTE — Assessment & Plan Note (Signed)
 Resolved

## 2023-07-30 NOTE — Assessment & Plan Note (Signed)
 Well controlled on current regimen of telmisartan  and hctz. , no changes today  Lab Results  Component Value Date   CREATININE 0.97 07/30/2023   Lab Results  Component Value Date   NA 139 07/30/2023   K 4.0 07/30/2023   CL 104 07/30/2023   CO2 29 07/30/2023

## 2023-07-30 NOTE — Patient Instructions (Signed)
 Take the prednisone  taper for  the next 6 days and suspend meloxicam  during this period  Add  2000 mg of acetominophen (tylenol) DAILY IN divided doses (1000 mg every 12 hours.)   PT renewed FOR KNEE PT    Return to [facility],   or to me,  after at least 4 weeks of PT to reassess

## 2023-07-30 NOTE — Assessment & Plan Note (Addendum)
 Diagnosed by biopsy of an AK  on the left side of nose in January

## 2023-07-30 NOTE — Telephone Encounter (Signed)
 These were ordered because of the normal CBC in December. There is a Clinical cytogeneticist message that was sent stating that she would like to wait and have these done at her appointment today.

## 2023-07-31 ENCOUNTER — Ambulatory Visit: Payer: Self-pay

## 2023-07-31 NOTE — Addendum Note (Signed)
 Addended by: Thressa Flora D on: 07/31/2023 10:37 AM   Modules accepted: Orders

## 2023-07-31 NOTE — Telephone Encounter (Signed)
 FYI Only or Action Required?: FYI only for provider  Patient was last seen in primary care on 07/30/23. Called Nurse Triage reporting needing information on urine and stool sample.  Triage Disposition: Information or Advice Only Call  Patient/caregiver understands and will follow disposition?: Yes    Copied from CRM [id number]. Topic: Clinical - Medical Advice >> Jul 31, 2023  8:21 AM Terri Melendez wrote: Reason for CRM: Patient called it stating that she has a few questions about an at-home urine collection. Callback number for patient is [phone]. Reason for Disposition  Health Information question, no triage required and triager able to answer question  Answer Assessment - Initial Assessment Questions 1. REASON FOR CALL or QUESTION: "What is your reason for calling today?" or "How can I best help you?" or "What question do you have that I can help answer?"     Patient had questions about urine sample. Patient had questions if she should discard the first urine. This RN educated patient to begin 24 hour timer at time of first urination - and then continue 24 hour window from first urination. But DISCARD first urination.  Protocols used: Information Only Call - No Triage-A-AH

## 2023-08-01 ENCOUNTER — Ambulatory Visit: Attending: Internal Medicine

## 2023-08-01 DIAGNOSIS — M25552 Pain in left hip: Secondary | ICD-10-CM | POA: Insufficient documentation

## 2023-08-01 DIAGNOSIS — M79672 Pain in left foot: Secondary | ICD-10-CM | POA: Insufficient documentation

## 2023-08-01 DIAGNOSIS — R262 Difficulty in walking, not elsewhere classified: Secondary | ICD-10-CM | POA: Insufficient documentation

## 2023-08-01 DIAGNOSIS — M25561 Pain in right knee: Secondary | ICD-10-CM | POA: Insufficient documentation

## 2023-08-01 NOTE — Therapy (Signed)
 OUTPATIENT PHYSICAL THERAPY EVALUATION   Patient Name: KAELEEN ODOM MRN: [id number] DOB:May 09, 1950, 73 y.o., female Today's Date: 08/01/2023  END OF SESSION:  PT End of Session - 08/01/23 0953     Visit Number 1    Number of Visits 16    Date for PT Re-Evaluation 10/24/23    Authorization Type [employer] 2025    Authorization Time Period 08/01/23-10/24/23    Progress Note Due on Visit 10    PT Start Time 0945    PT Stop Time 1025    PT Time Calculation (min) 40 min    Activity Tolerance Patient tolerated treatment well;No increased pain    Behavior During Therapy [facility] for tasks assessed/performed             Past Medical History:  Diagnosis Date   Actinic keratosis    Cancer (HCC)    basal cell on face in 2000 or 2001   Hx of basal cell carcinoma 2002   right cheek    Hx of basal cell carcinoma 02/11/2020   glabella, Mohs with Dr. 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   Anterior knee pain, right 07/30/2023   Trochanteric bursitis of left hip 04/30/2023   Plantar fasciitis of left foot 03/28/2022   Anemia 01/10/2022   Allergic rhinitis 06/17/2020   GERD with esophagitis 06/17/2020   Personal history of other malignant neoplasm of skin 06/03/2020   Polyarthralgia 12/15/2019   Thoracic aortic atherosclerosis (HCC) 12/15/2019   Coronary atherosclerosis due to calcified coronary lesion (CODE) 12/15/2019   Encounter for preventive health examination 12/13/2018   History of shingles 05/04/2018   Non-functional thyroid  nodule 05/04/2018   Essential hypertension 10/22/2017   Advanced care planning/counseling discussion 10/22/2017   Hypercholesteremia 05/14/2016   OSA on CPAP 10/13/2014    PCP: Thersia Flax, MD  REFERRING PROVIDER: Thersia Flax, MD REFERRING DIAG: Right knee pain  THERAPY DIAG:  Acute pain of right knee  Difficulty in walking, not elsewhere classified  Rationale for Evaluation and Treatment: Rehabilitation ONSET DATE: June 15, 2023  SUBJECTIVE:  SUBJECTIVE STATEMENT: Raynelle Callow returns to clinic for evaluation of Rt knee issue that's been nagging her for several weeks now.   PERTINENT HISTORY: 72yoF referred to OPPT for continued knee pain and difficulty walking following a twisting injury sustains 06/15/23 wth resultant severe pain, difficulty walking, seen at [facility] 2 days later, unremarkable imaging, issued a knee brace, then returned weeks later and received joint injection. Pt denies any clicking, locking, buckling, instability. Pt denies stiffness as a primary symptom. Pain has progressively improved, but remains painful daily, functionally restrictive to stairs performance and prior AMB distances. In the most recent two weeks to evaluation here on 08/01/23 pt has managed her pain with icing at night, intermittent tylenol, intermittent meloxicam  (prescribed for a different issue), and most recently started a 5 day oral prednisone  course on 07/31/23. Pt recently DC from this clinic for hip pain problem, seen last year here for plantar fasciitis issue. At evlauation patient reports 3/10 current pain , 5/10 worst pain (in past 7 days), and sometimes is pain free at rest. She stopped using the hinged neoprene knee brace daily as  of end of May, but uses it ad lib, as well as ad lib compression sleeve.  PAIN:  Are you having pain? 3/10 medial and lateral anterior knee pain near joint line.   PRECAUTIONS: None WEIGHT BEARING RESTRICTIONS: no FALLS:  Has patient fallen in last 6 months? no  LIVING ENVIRONMENT: Stairs: 1 entry step and 2nd level of home with nonessential items. OCCUPATION: retired PLOF: Independent  PATIENT GOALS: return to baseline tolerance of community  distance AMB  NEXT MD VISIT: Will FU with PCP after 1 month of OPPT for this issue.   OBJECTIVE:  Note: Objective measures were completed at Evaluation unless otherwise noted.  DIAGNOSTIC FINDINGS:  unremarkable knee xrays taken at [facility] in April 2025  PATIENT SURVEYS:  LEFS: 43.8% EDEMA:  Mild global knee joint effusion noted during exam, does not appear to limit ROM in a meaningful way PALPATION: Medial effusion palpated at medial popliteal fossa, consistent with typical baker's cyst type presentation  LOWER EXTREMITY ROM:   ROM Right eval Left eval  Knee flexion 0 degrees   Knee extension >140 degress    (Blank rows = not tested)  LOWER EXTREMITY MMT:  MMT Right eval Left eval  Hip flexion 4+/5 5/5  Hip extension (supine @ 30deg) 4+/5 4+/5   Hip abduction (supine) 5/5 5/5  Hip internal rotation 5/5 5/5  Hip external rotation 5/5^ 5/5  Knee flexion 5/5 5/5  Knee extension 5/5* 5/5  Ankle dorsiflexion 5/5 5/5  *posterior knee pain  ^medial knee pain   LOWER EXTREMITY SPECIAL TESTS:  Knee special tests: McMurray's test: negative Varus/Valgus Stress Test: neg bilat   FUNCTIONAL TESTS:  5xSTS: hands free, 13.46sec not pain limited  547ft AMB: see details below GAIT: Distance walked: 555ft; 1.7m/s  Assistive device utilized: none Level of assistance: independent  Comments: symmetrical, without antalgia, even pacing, no frank unsteadiness or ataxia. Pt has slight high velocity low amplitude trendelenburg bilat, very minimal left knee genu varus moment in midstance and mild genu varus moment on Right knee in midstance. No increase in pain during testing, no appreciable pain during: of note pt on day 2 of 5 of prednisone  course.                                                                                                                              TREATMENT DATE 08/01/23 -Single leg bridge 1x8 bilat c CL SLR  -LAQ 1x10x5secH  -update of current HEP to  remove activities with knee rotation/torsion   PATIENT EDUCATION:  Education details: see above  Person educated: patient  Education method: Research scientist (medical), deliberate practice, positive reinforcement, explicit instruction, establish rules. Education comprehension: good   HOME EXERCISE PROGRAM: Access Code: [email or web address] URL: [email or web address] Date: 08/01/2023 Prepared by: Atlee Blanks  Exercises - Seated Hamstring Stretch  - 1 x daily - 7 x weekly - 3 reps - 60 sec hold - Sidelying Hip Abduction  - 3-4 x weekly -  3 sets - 10 reps - Single Leg Bridge  - 3-4 x weekly - 3 sets - 10 reps - Seated Long Arc Quad  - 3 x daily - 1 sets - 10 reps - 5sech  hold - Heel Raises with Counter Support  - 3 x daily - 1 sets - 20 reps  ASSESSMENT:  CLINICAL IMPRESSION: 72yoF who is referred to OPPT 2 months s/p Rt knee sprain. Pt has continued to improve overall, but still very limited with daily pain. Knee is edematous, however ROM is excellent. Exam revealing of mild varus changes in knee joint, certainly chronic, mild laxity in varus, but nonpainful in gait. Pt has mild pain weakness and pain with testing of Rt hip flexion and Rt knee extension. Edema is felt in the popliteal fossa. Pt will benefit from OPPT skilled intervention to improve pain and restore tolerance and participation in activities of IADL and leisure.   OBJECTIVE IMPAIRMENTS: Abnormal gait, decreased activity tolerance, decreased balance, decreased endurance, decreased mobility, difficulty walking, decreased ROM, decreased strength, and increased edema.  ACTIVITY LIMITATIONS: carrying, bending, standing, squatting, sleeping, stairs, and transfers PARTICIPATION LIMITATIONS: meal prep, cleaning, driving, shopping, community activity, and yard work PERSONAL FACTORS: Age, Fitness, Past/current experiences, and Time since onset of injury/illness/exacerbation are also affecting patient's functional outcome.  REHAB  POTENTIAL: Excellent CLINICAL DECISION MAKING: Evolving/moderate complexity EVALUATION COMPLEXITY: Moderate  GOALS: Goals reviewed with patient? Yes  SHORT TERM GOALS: Target date: 08/31/23 Pt to demonstrate >1535ft without increase in knee pain >1 NPRS, without antalgia.  Baseline: unable Goal status: INITIAL  2.  MMT testing all 5/5 and pain free compared to evaluation pain.  Baseline: pain with Rt knee extension and Rt hip flexion.  Goal status: INITIAL  3.  LEFS score improvement >10%  Baseline:  43.8% 08/01/23 Goal status: INITIAL  4.  5xSTS hands free, standard surface height, symmetrical footing, <12.25sec Baseline: 13.46sec hands free, not pain liimted Goal status: INITIAL  LONG TERM GOALS: Target date: 10/01/23  Pt to demonstrate >1675ft without increase in knee pain >1 NPRS, without antalgia.  Baseline: unable Goal status: INITIAL  2.  MMT testing all 5/5 and pain free compared to evaluation pain.  Baseline: pain with Rt knee extension and Rt hip flexion.  Goal status: INITIAL  3.  LEFS score improvement >25% compared to initital survey  Baseline:  43.8% 08/01/23 Goal status: INITIAL  4.  5xSTS hands free, standard surface height, symmetrical footing, <10sec Baseline: 13.46sec hands free, not pain liimted Goal status: INITIAL   PLAN: PT FREQUENCY: 1-2x/week PT DURATION: 12 weeks PLANNED INTERVENTIONS: 97750- Physical Performance Testing, 97110-Therapeutic exercises, 97530- Therapeutic activity, 97112- Neuromuscular re-education, 97535- Self Care, 65784- Manual therapy, [phone]- Gait training, [phone]- Electrical stimulation (unattended), [phone]- Electrical stimulation (manual), Patient/Family education, Balance training, Stair training, Joint mobilization, Joint manipulation, DME instructions, Cryotherapy, and Moist heat  PLAN FOR NEXT SESSION: Review HEP tolerance, symptoms behavior since completing prednisone , progress/establish moderate resistance loading in  medium volume program (closed and opn chain)   1:59 PM, 08/01/23 Dawn Eth, PT, DPT Physical Therapist - [place] [phone] (Office)   Keishawn Darsey C, PT 08/01/2023, 9:58 AM

## 2023-08-03 ENCOUNTER — Ambulatory Visit: Payer: Self-pay | Admitting: Internal Medicine

## 2023-08-03 ENCOUNTER — Other Ambulatory Visit: Payer: Self-pay | Admitting: Internal Medicine

## 2023-08-03 DIAGNOSIS — R944 Abnormal results of kidney function studies: Secondary | ICD-10-CM

## 2023-08-04 LAB — PROTEIN ELECTROPHORESIS, SERUM
Albumin ELP: 3.7 g/dL — ABNORMAL LOW (ref 3.8–4.8)
Alpha 1: 0.3 g/dL (ref 0.2–0.3)
Alpha 2: 0.6 g/dL (ref 0.5–0.9)
Beta 2: 0.3 g/dL (ref 0.2–0.5)
Beta Globulin: 0.4 g/dL (ref 0.4–0.6)
Gamma Globulin: 0.6 g/dL — ABNORMAL LOW (ref 0.8–1.7)
Total Protein: 5.9 g/dL — ABNORMAL LOW (ref 6.1–8.1)

## 2023-08-05 LAB — IMMUNOFIXATION INTE

## 2023-08-06 ENCOUNTER — Other Ambulatory Visit (INDEPENDENT_AMBULATORY_CARE_PROVIDER_SITE_OTHER)

## 2023-08-06 ENCOUNTER — Ambulatory Visit

## 2023-08-06 DIAGNOSIS — R262 Difficulty in walking, not elsewhere classified: Secondary | ICD-10-CM

## 2023-08-06 DIAGNOSIS — M25552 Pain in left hip: Secondary | ICD-10-CM

## 2023-08-06 DIAGNOSIS — M25561 Pain in right knee: Secondary | ICD-10-CM

## 2023-08-06 DIAGNOSIS — D649 Anemia, unspecified: Secondary | ICD-10-CM | POA: Diagnosis not present

## 2023-08-06 DIAGNOSIS — M79672 Pain in left foot: Secondary | ICD-10-CM

## 2023-08-06 LAB — FECAL OCCULT BLOOD, IMMUNOCHEMICAL: Fecal Occult Bld: NEGATIVE

## 2023-08-06 NOTE — Therapy (Signed)
 OUTPATIENT PHYSICAL THERAPY TREATMENT   Patient Name: Terri Melendez MRN: [id number] DOB:Feb 12, 1951, 73 y.o., female Today's Date: 08/06/2023  END OF SESSION:  PT End of Session - 08/06/23 0904     Visit Number 2    Number of Visits 16    Date for PT Re-Evaluation 10/24/23    Authorization Type [employer] 2025    Authorization Time Period 08/01/23-10/24/23    Progress Note Due on Visit 10    PT Start Time 0900    PT Stop Time 0940    PT Time Calculation (min) 40 min    Activity Tolerance Patient tolerated treatment well;No increased pain    Behavior During Therapy [facility] for tasks assessed/performed             Past Medical History:  Diagnosis Date   Actinic keratosis    Cancer (HCC)    basal cell on face in 2000 or 2001   Hx of basal cell carcinoma 2002   right cheek    Hx of basal cell carcinoma 02/11/2020   glabella, Mohs with Dr. 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   Anterior knee pain, right 07/30/2023   Trochanteric bursitis of left hip 04/30/2023   Plantar fasciitis of left foot 03/28/2022   Anemia 01/10/2022   Allergic rhinitis 06/17/2020   GERD with esophagitis 06/17/2020   Personal history of other malignant neoplasm of skin 06/03/2020   Polyarthralgia 12/15/2019   Thoracic aortic atherosclerosis (HCC) 12/15/2019   Coronary atherosclerosis due to calcified coronary lesion (CODE) 12/15/2019   Encounter for preventive health examination 12/13/2018   History of shingles 05/04/2018   Non-functional thyroid  nodule 05/04/2018   Essential hypertension 10/22/2017   Advanced care planning/counseling discussion 10/22/2017   Hypercholesteremia 05/14/2016   OSA on CPAP 10/13/2014    PCP: Thersia Flax, MD  REFERRING PROVIDER: Thersia Flax, MD REFERRING DIAG: Right knee pain  THERAPY DIAG:  Acute pain of right knee  Difficulty in walking, not elsewhere classified  Pain in left hip  Pain in left foot  Rationale for Evaluation and Treatment: Rehabilitation ONSET DATE: June 15, 2023  SUBJECTIVE:  SUBJECTIVE STATEMENT: Terri Melendez returns to clinic after evaluation, knee somewhat sore. She has strated resume walking program at home. She reports HEP is going well, but single leg bridge was difficult.   PERTINENT HISTORY: 72yoF referred to OPPT for continued knee pain and difficulty walking following a twisting injury sustains 06/15/23 wth resultant severe pain, difficulty walking, [facility], unremarkable imaging, issued a knee brace, then returned weeks later and received joint injection. Pt denies any clicking, locking, buckling, instability. Pt denies stiffness as a primary symptom. Pain has progressively improved, but remains painful daily, functionally restrictive to stairs performance and prior AMB distances. In the most recent two weeks to evaluation here on 08/01/23 pt has managed her pain with icing at night, intermittent tylenol, intermittent meloxicam  (prescribed for a different issue), and most recently started a 5 day oral prednisone  course on 07/31/23. Pt recently DC from this clinic for hip pain problem, seen last year here for plantar fasciitis issue. At evlauation patient reports 3/10 current pain , 5/10 worst pain (  in past 7 days), and sometimes is pain free at rest. She stopped using the hinged neoprene knee brace daily as of end of May, but uses it ad lib, as well as ad lib compression sleeve.  PAIN:  Are you having pain? Yes 3/10 medial knee getting out of car.    PRECAUTIONS: None WEIGHT BEARING RESTRICTIONS: no FALLS:  Has patient fallen in last 6 months? no  PATIENT GOALS: return to baseline tolerance of community distance AMB  NEXT MD VISIT: Will  FU with PCP after 1 month of OPPT for this issue.   OBJECTIVE:  CIRCUMFERENCIAL EDEMA ASSESSMENT (6/10):   Left knee (center patella): 38.5cm  Right knee (center patella): 39.5cm                                                                                                                             TREATMENT DATE 08/06/23 -Nustep AA/ROM x 4 minutes, seat 7, arms 8, Level 2  -LAQ 1x10x5secH  -STS from plinth, hands free x10 -single leg bridge c CL leg supported on bolster 2x10 bilat  -double leg bridge 1x10   -circumferential knee assessment (see above)  -SAQ over bolster 1x15 @ 3lb AW (conservative loading) -Hooklying isometric blueball squeeze 15x5secH  -SAQ over bolster 1x15 @ 3lb AW  -isometric clam 15x5secH (gait belt resistance)  -Hooklying isometric blueball squeeze 15x5secH  -SAQ over bolster 1x15 @ 3lb AW   -STS from plinth, hands free x10 -SLS balance 10x5sec bilat alternating sides   PATIENT EDUCATION:  Education details: "easing into return to walking workouts", can consider a phone app to determine walking distance, or just track minutes.  Person educated: patient  Education method: collaborative learning, deliberate practice, positive reinforcement, explicit instruction, establish rules. Education comprehension: good   HOME EXERCISE PROGRAM: Access Code: [email or web address] URL: [email or web address] Date: 08/01/2023 Prepared by: Atlee Blanks  Exercises - Seated Hamstring Stretch  - 1 x daily - 7 x weekly - 3 reps - 60 sec hold - Sidelying Hip Abduction  - 3-4 x weekly - 3 sets - 10 reps - Single Leg Bridge  - 3-4 x weekly - 3 sets - 10 reps (with CL limb supported in elevation) - Seated Long Arc Quad  - 3 x daily - 1 sets - 10 reps - 5sech  hold - Heel Raises with Counter Support  - 3 x daily - 1 sets - 20 reps  ASSESSMENT:  CLINICAL IMPRESSION: Reviewed HEP and modified as needed. Continued to slowly advance loading program. Symptoms stable  within session. Pt received verbal, visual, tactile cues well when performing directed form for exercises. Pt will benefit from OPPT skilled intervention to improve pain and restore tolerance and participation in activities of IADL and leisure.   OBJECTIVE IMPAIRMENTS: Abnormal gait, decreased activity tolerance, decreased balance, decreased endurance, decreased mobility, difficulty walking, decreased ROM, decreased strength, and increased edema.  ACTIVITY LIMITATIONS: carrying, bending, standing, squatting, sleeping, stairs, and transfers PARTICIPATION LIMITATIONS: meal  prep, cleaning, driving, shopping, community activity, and yard work PERSONAL FACTORS: Age, Fitness, Past/current experiences, and Time since onset of injury/illness/exacerbation are also affecting patient's functional outcome.  REHAB POTENTIAL: Excellent CLINICAL DECISION MAKING: Evolving/moderate complexity EVALUATION COMPLEXITY: Moderate  GOALS: Goals reviewed with patient? Yes  SHORT TERM GOALS: Target date: 08/31/23 Pt to demonstrate >1539ft without increase in knee pain >1 NPRS, without antalgia.  Baseline: unable Goal status: INITIAL  2.  MMT testing all 5/5 and pain free compared to evaluation pain.  Baseline: pain with Rt knee extension and Rt hip flexion.  Goal status: INITIAL  3.  LEFS score improvement >10%  Baseline:  43.8% 08/01/23 Goal status: INITIAL  4.  5xSTS hands free, standard surface height, symmetrical footing, <12.25sec Baseline: 13.46sec hands free, not pain liimted Goal status: INITIAL  LONG TERM GOALS: Target date: 10/01/23  Pt to demonstrate >1636ft without increase in knee pain >1 NPRS, without antalgia.  Baseline: unable Goal status: INITIAL  2.  MMT testing all 5/5 and pain free compared to evaluation pain.  Baseline: pain with Rt knee extension and Rt hip flexion.  Goal status: INITIAL  3.  LEFS score improvement >25% compared to initital survey  Baseline:  43.8%  08/01/23 Goal status: INITIAL  4.  5xSTS hands free, standard surface height, symmetrical footing, <10sec Baseline: 13.46sec hands free, not pain liimted Goal status: INITIAL  PLAN: PT FREQUENCY: 1-2x/week PT DURATION: 12 weeks PLANNED INTERVENTIONS: 97750- Physical Performance Testing, 97110-Therapeutic exercises, 97530- Therapeutic activity, 97112- Neuromuscular re-education, 97535- Self Care, 21308- Manual therapy, [phone]- Gait training, [phone]- Electrical stimulation (unattended), [phone]- Electrical stimulation (manual), Patient/Family education, Balance training, Stair training, Joint mobilization, Joint manipulation, DME instructions, Cryotherapy, and Moist heat  PLAN FOR NEXT SESSION: Review HEP tolerance, symptoms behavior since completing prednisone , progress/establish moderate resistance loading in medium volume program (closed and opn chain)   9:06 AM, 08/06/23 Dawn Eth, PT, DPT Physical Therapist - [place] [phone] (Office)   Jonavan Vanhorn C, PT 08/06/2023, 9:06 AM

## 2023-08-07 NOTE — Telephone Encounter (Signed)
 Copied from CRM [id number]. Topic: General - Other >> Aug 01, 2023  8:53 AM Clyde Darling P wrote: Reason for CRM: Pt advise she will be bringing back 24 hours urinalysis , was advise by labs to call before heading out there.

## 2023-08-08 ENCOUNTER — Ambulatory Visit

## 2023-08-08 DIAGNOSIS — R262 Difficulty in walking, not elsewhere classified: Secondary | ICD-10-CM

## 2023-08-08 DIAGNOSIS — M25561 Pain in right knee: Secondary | ICD-10-CM

## 2023-08-08 NOTE — Therapy (Signed)
 OUTPATIENT PHYSICAL THERAPY TREATMENT   Patient Name: Terri Melendez MRN: [id number] DOB:1950/04/11, 73 y.o., female Today's Date: 08/08/2023  END OF SESSION:  PT End of Session - 08/08/23 0903     Visit Number 3    Number of Visits 16    Date for PT Re-Evaluation 10/24/23    Authorization Type [employer] 2025    Authorization Time Period 08/01/23-10/24/23    Progress Note Due on Visit 10    PT Start Time 0900    PT Stop Time 0940    PT Time Calculation (min) 40 min    Activity Tolerance Patient tolerated treatment well;No increased pain    Behavior During Therapy [facility] for tasks assessed/performed          Past Medical History:  Diagnosis Date   Actinic keratosis    Cancer (HCC)    basal cell on face in 2000 or 2001   Hx of basal cell carcinoma 2002   right cheek    Hx of basal cell carcinoma 02/11/2020   glabella, Mohs with Dr. 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   Anterior knee pain, right 07/30/2023   Trochanteric bursitis of left hip 04/30/2023   Plantar fasciitis of left foot 03/28/2022   Anemia 01/10/2022   Allergic rhinitis 06/17/2020   GERD with esophagitis 06/17/2020   Personal history of other malignant neoplasm of skin 06/03/2020   Polyarthralgia 12/15/2019   Thoracic aortic atherosclerosis (HCC) 12/15/2019   Coronary atherosclerosis due to calcified coronary lesion (CODE) 12/15/2019   Encounter for preventive health examination 12/13/2018   History of shingles 05/04/2018   Non-functional thyroid  nodule 05/04/2018   Essential hypertension 10/22/2017   Advanced care planning/counseling discussion 10/22/2017   Hypercholesteremia 05/14/2016   OSA on CPAP 10/13/2014    PCP: Thersia Flax, MD   REFERRING PROVIDER: Thersia Flax, MD REFERRING DIAG: Right knee pain  THERAPY DIAG:  Acute pain of right knee  Difficulty in walking, not elsewhere classified  Rationale for Evaluation and Treatment: Rehabilitation ONSET DATE: June 15, 2023  SUBJECTIVE:  SUBJECTIVE STATEMENT: Knee has been more sore today and yesterday. She was able to increase her walking a bit without any repercussion. HEP is going well, she did well with the modification for the single leg bridge.    PERTINENT HISTORY: 72yoF referred to OPPT for continued knee pain and difficulty walking following a twisting injury sustains 06/15/23 wth resultant severe pain, difficulty walking, [facility], unremarkable imaging, issued a knee brace, then returned weeks later and received joint injection. Pt denies any clicking, locking, buckling, instability. Pt denies stiffness as a primary symptom. Pain has progressively improved, but remains painful daily, functionally restrictive to stairs performance and prior AMB distances. In the most recent two weeks to evaluation here on 08/01/23 pt has managed her pain with icing at night, intermittent tylenol, intermittent meloxicam  (prescribed for a different issue), and most recently started a 5 day oral prednisone  course on 07/31/23. Pt recently DC from this clinic for hip pain problem, seen last year here for plantar fasciitis issue. At evlauation patient reports 3/10 current pain , 5/10 worst pain (in past 7 days), and sometimes  is pain free at rest. She stopped using the hinged neoprene knee brace daily as of end of May, but uses it ad lib, as well as ad lib compression sleeve.  PAIN:  Are you having pain? No pain, just soreness.  PRECAUTIONS: None WEIGHT BEARING RESTRICTIONS: no FALLS:  Has patient fallen in last 6 months? no  PATIENT GOALS: return to baseline tolerance of community distance AMB  NEXT MD VISIT: Will FU with PCP after 1 month of OPPT for this  issue.   OBJECTIVE:  CIRCUMFERENCIAL EDEMA ASSESSMENT (6/10):   Left knee (center patella): 38.5cm  Right knee (center patella): 39.5cm                                                                                                                             TREATMENT DATE 08/08/23 -Nustep AA/ROM x 4 minutes, seat 7, arms 8, Level 2  -STS c 3kg ball x10  -LAQ 1x10x5secH  -double heel raise x20 (BUE support)  -short sitting band clam BTB x10 -standing hip extension x10 (2lb AW, slight forward flexed)   -STS c 3kg ball x10  -LAQ 1x10x5secH  -double heel raise x20 (BUE support)  -short sitting band clam GTB x10  -standing hip extension x10 (2lb AW, slight forward flexed)   -Hooklying Bridge + blue ball squeeze 1x15 (to decreased gluteus maximus activation) -SAQ over bolster 1x10 @ 5lb AW (2lb increase this date) -Hooklying Bridge + blue ball squeeze 1x15  -SAQ over bolster 1x10 @ 5lb AW  -Hooklying Bridge + blue ball squeeze 1x15  -SAQ over bolster 1x10 @ 5lb AW   -SLS balance 10x5sec bilat alternating sides -narrow stance on airex foam pad x60   PATIENT EDUCATION:  Education details: maintain walking time/distance until symptoms are more stable, predictable. Person educated: patient  Education method: collaborative learning, deliberate practice, positive reinforcement, explicit instruction, establish rules. Education comprehension: good   HOME EXERCISE PROGRAM: Access Code: [email or web address] URL: [email or web address] Date: 08/01/2023 Prepared by: Atlee Blanks  Exercises - Seated Hamstring Stretch  - 1 x daily - 7 x weekly - 3 reps - 60 sec hold - Sidelying Hip Abduction  - 3-4 x weekly - 3 sets - 10 reps - Single Leg Bridge  - 3-4 x weekly - 3 sets - 10 reps (with CL limb supported in elevation) - Seated Long Arc Quad  - 3 x daily - 1 sets - 10 reps - 5sech  hold - Heel Raises with Counter Support  - 3 x daily - 1 sets - 20 reps  ASSESSMENT:  CLINICAL  IMPRESSION: Pain in knee slowly increasing last two days, unable to r/o a decrease in joint injection relief. Pt shows favorable response from A/ROM for reducing pain/stiffness. Loading is tolerated well in session, but remains conservative as we remain early in rehab phase. Pt able to increase load and expand on program today. Pt will benefit from OPPT skilled intervention to improve pain and restore tolerance  and participation in activities of IADL and leisure.   OBJECTIVE IMPAIRMENTS: Abnormal gait, decreased activity tolerance, decreased balance, decreased endurance, decreased mobility, difficulty walking, decreased ROM, decreased strength, and increased edema.  ACTIVITY LIMITATIONS: carrying, bending, standing, squatting, sleeping, stairs, and transfers PARTICIPATION LIMITATIONS: meal prep, cleaning, driving, shopping, community activity, and yard work PERSONAL FACTORS: Age, Fitness, Past/current experiences, and Time since onset of injury/illness/exacerbation are also affecting patient's functional outcome.  REHAB POTENTIAL: Excellent CLINICAL DECISION MAKING: Evolving/moderate complexity EVALUATION COMPLEXITY: Moderate  GOALS: Goals reviewed with patient? Yes  SHORT TERM GOALS: Target date: 08/31/23 Pt to demonstrate >153ft without increase in knee pain >1 NPRS, without antalgia.  Baseline: unable Goal status: INITIAL  2.  MMT testing all 5/5 and pain free compared to evaluation pain.  Baseline: pain with Rt knee extension and Rt hip flexion.  Goal status: INITIAL  3.  LEFS score improvement >10%  Baseline:  43.8% 08/01/23 Goal status: INITIAL  4.  5xSTS hands free, standard surface height, symmetrical footing, <12.25sec Baseline: 13.46sec hands free, not pain liimted Goal status: INITIAL  LONG TERM GOALS: Target date: 10/01/23  Pt to demonstrate >162ft without increase in knee pain >1 NPRS, without antalgia.  Baseline: unable Goal status: INITIAL  2.  MMT testing  all 5/5 and pain free compared to evaluation pain.  Baseline: pain with Rt knee extension and Rt hip flexion.  Goal status: INITIAL  3.  LEFS score improvement >25% compared to initital survey  Baseline:  43.8% 08/01/23 Goal status: INITIAL  4.  5xSTS hands free, standard surface height, symmetrical footing, <10sec Baseline: 13.46sec hands free, not pain liimted Goal status: INITIAL  PLAN: PT FREQUENCY: 1-2x/week PT DURATION: 12 weeks PLANNED INTERVENTIONS: 97750- Physical Performance Testing, 97110-Therapeutic exercises, 97530- Therapeutic activity, 97112- Neuromuscular re-education, 97535- Self Care, 66063- Manual therapy, [phone]- Gait training, [phone]- Electrical stimulation (unattended), [phone]- Electrical stimulation (manual), Patient/Family education, Balance training, Stair training, Joint mobilization, Joint manipulation, DME instructions, Cryotherapy, and Moist heat  PLAN FOR NEXT SESSION: Review HEP tolerance, symptoms behavior since completing prednisone , progress/establish moderate resistance loading in medium volume program (closed and open chain)   9:05 AM, 08/08/23 Dawn Eth, PT, DPT Physical Therapist - [place] [phone] (Office)  Arnika Larzelere C, PT 08/08/2023, 9:05 AM

## 2023-08-13 ENCOUNTER — Ambulatory Visit: Admitting: Dermatology

## 2023-08-13 ENCOUNTER — Ambulatory Visit: Admitting: Physical Therapy

## 2023-08-13 ENCOUNTER — Encounter: Payer: Self-pay | Admitting: Dermatology

## 2023-08-13 DIAGNOSIS — M25561 Pain in right knee: Secondary | ICD-10-CM | POA: Diagnosis not present

## 2023-08-13 DIAGNOSIS — Z86007 Personal history of in-situ neoplasm of skin: Secondary | ICD-10-CM | POA: Diagnosis not present

## 2023-08-13 DIAGNOSIS — R262 Difficulty in walking, not elsewhere classified: Secondary | ICD-10-CM

## 2023-08-13 NOTE — Therapy (Signed)
 OUTPATIENT PHYSICAL THERAPY TREATMENT   Patient Name: Terri Melendez MRN: [id number] DOB:[birth year], 73 y.o., female Today's Date: 08/13/2023  END OF SESSION:  PT End of Session - 08/13/23 1307     Visit Number 4    Number of Visits 16    Date for PT Re-Evaluation 10/24/23    Authorization Type [employer] 2025    Authorization Time Period 08/01/23-10/24/23    Authorization - Visit Number 4    Authorization - Number of Visits 20    Progress Note Due on Visit 10    PT Start Time 1300    PT Stop Time 1345    PT Time Calculation (min) 45 min    Activity Tolerance Patient tolerated treatment well;No increased pain    Behavior During Therapy [facility] for tasks assessed/performed          Past Medical History:  Diagnosis Date   Actinic keratosis    Cancer (HCC)    basal cell on face in 2000 or 2001   Hx of basal cell carcinoma 2002   right cheek    Hx of basal cell carcinoma 02/11/2020   glabella, Mohs with Dr. 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   Anterior knee pain, right 07/30/2023   Trochanteric bursitis of left hip 04/30/2023   Plantar fasciitis of left foot 03/28/2022   Anemia 01/10/2022   Allergic rhinitis 06/17/2020   GERD with esophagitis 06/17/2020   Personal history of other malignant neoplasm of skin 06/03/2020   Polyarthralgia 12/15/2019   Thoracic aortic atherosclerosis (HCC) 12/15/2019   Coronary atherosclerosis due to calcified coronary lesion (CODE) 12/15/2019   Encounter for preventive health examination 12/13/2018   History of shingles 05/04/2018   Non-functional thyroid  nodule 05/04/2018   Essential hypertension 10/22/2017   Advanced care planning/counseling discussion 10/22/2017    Hypercholesteremia 05/14/2016   OSA on CPAP 10/13/2014    PCP: Thersia Flax, MD  REFERRING PROVIDER: Thersia Flax, MD REFERRING DIAG: Right knee pain  THERAPY DIAG:  Acute pain of right knee  Difficulty in walking, not elsewhere classified  Rationale for Evaluation and Treatment: Rehabilitation ONSET DATE: June 15, 2023  SUBJECTIVE:  SUBJECTIVE STATEMENT: Pt reports ongoing improvement in right knee pain with ability to walk and move around without use of knee brace and without an increase in knee pain.   PERTINENT HISTORY: 72yoF referred to OPPT for continued knee pain and difficulty walking following a twisting injury sustains 06/15/23 wth resultant severe pain, difficulty walking, [facility], unremarkable imaging, issued a knee brace, then returned weeks later and received joint injection. Pt denies any clicking, locking, buckling, instability. Pt denies stiffness as a primary symptom. Pain has progressively improved, but remains painful daily, functionally restrictive to stairs performance and prior AMB distances. In the most recent two weeks to evaluation here on 08/01/23 pt has managed her pain with icing at night, intermittent tylenol, intermittent meloxicam  (prescribed for a different issue), and most recently started a 5 day oral prednisone  course on 07/31/23. Pt recently DC from this clinic for hip pain problem, seen last year here for plantar fasciitis issue. At evlauation patient reports 3/10 current pain , 5/10  worst pain (in past 7 days), and sometimes is pain free at rest. She stopped using the hinged neoprene knee brace daily as of end of May, but uses it ad lib, as well as ad lib compression sleeve.  PAIN:  Are you having pain? No pain, just soreness.  PRECAUTIONS: None WEIGHT BEARING RESTRICTIONS: no FALLS:  Has patient fallen in last 6 months? no  PATIENT GOALS: return to baseline tolerance of community distance AMB  NEXT MD VISIT: Will FU  with PCP after 1 month of OPPT for this issue.   OBJECTIVE:  CIRCUMFERENCIAL EDEMA ASSESSMENT (6/10):   Left knee (center patella): 38.5cm  Right knee (center patella): 39.5cm                                                                                                                             TREATMENT DATE   08/13/23  THEREX   Recumbent bike seat set at 6 for 5 min  : 1,400 feet  Quarter squats 1 x 10  Box Squats with water jug 2 x 10  Reverse Lunges on RLE with BUE support 1 x 10  Reverse Lunges on RLE with 1 UE support 1 x 10   -medial knee pain   Banded Side Step with red band x 1- 10 over 12 ft     PATIENT EDUCATION:  Education details: maintain walking time/distance until symptoms are more stable, predictable. Person educated: patient  Education method: collaborative learning, deliberate practice, positive reinforcement, explicit instruction, establish rules. Education comprehension: good   HOME EXERCISE PROGRAM: Access Code: [email or web address] URL: [email or web address] Date: 08/13/2023 Prepared by: Marge Shed  Exercises - Seated Hamstring Stretch  - 1 x daily - 7 x weekly - 3 reps - 60 sec hold - Reverse Lunge  - 3-4 x weekly - 3 sets - 10 reps - Single Leg Bridge  - 3-4 x weekly - 3 sets - 10 reps - Side Stepping with Resistance at Feet  - 1 x daily - 3-4 x weekly - 10 reps  ASSESSMENT:  CLINICAL IMPRESSION: Pt continues to show progress with right knee function and strength and decreased right knee pain with ability to perform progressed strengthening exercises with little to no increase in her pain. 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 She did have some increase in pain along medial joint line with lunges, but this did not limit her from continuing with session. She will benefit from OPPT skilled intervention to improve pain and restore tolerance and  participation in activities of IADL and leisure.   OBJECTIVE IMPAIRMENTS: Abnormal gait, decreased activity tolerance, decreased balance, decreased endurance, decreased mobility, difficulty walking, decreased ROM, decreased strength, and increased edema.  ACTIVITY LIMITATIONS: carrying, bending, standing, squatting, sleeping, stairs, and transfers PARTICIPATION LIMITATIONS: meal prep, cleaning, driving, shopping, community activity, and yard work PERSONAL FACTORS: Age, Fitness, Past/current experiences, and  Time since onset of injury/illness/exacerbation are also affecting patient's functional outcome.  REHAB POTENTIAL: Excellent CLINICAL DECISION MAKING: Evolving/moderate complexity EVALUATION COMPLEXITY: Moderate  GOALS: Goals reviewed with patient? Yes  SHORT TERM GOALS: Target date: 08/31/23 Pt to demonstrate >1566ft without increase in knee pain >1 NPRS, without antalgia.  Baseline: 1,400 ft  Goal status: ONGOING   2.  MMT testing all 5/5 and pain free compared to evaluation pain.  Baseline: pain with Rt knee extension and Rt hip flexion.  Goal status: ONGOING    3.  LEFS score improvement >10%  Baseline:  43.8% 08/01/23 Goal status: ONGOING   4.  5xSTS hands free, standard surface height, symmetrical footing, <12.25sec Baseline: 13.46sec hands free, not pain liimted Goal status: ONGOING    LONG TERM GOALS: Target date: 10/01/23  Pt to demonstrate >1625ft without increase in knee pain >1 NPRS, without antalgia.  Baseline: 1,400 ft   Goal status: ONGOING   2.  MMT testing all 5/5 and pain free compared to evaluation pain.  Baseline: pain with Rt knee extension and Rt hip flexion.  Goal status: ONGOING   3.  LEFS score improvement >25% compared to initital survey  Baseline:  43.8% 08/01/23 Goal status: ONGOING    4.  5xSTS hands free, standard surface height, symmetrical footing, <10sec Baseline: 13.46sec hands free, not pain liimted Goal status: ONGOING    PLAN: PT FREQUENCY: 1-2x/week PT DURATION: 12 weeks PLANNED INTERVENTIONS: 97750- Physical Performance Testing, 97110-Therapeutic exercises, 97530- Therapeutic activity, [id number]- Neuromuscular re-education, 97535- Self Care, 16109- Manual therapy, [id number]- Gait training, [phone]- Electrical stimulation (unattended), [phone]- Electrical stimulation (manual), Patient/Family education, Balance training, Stair training, Joint mobilization, Joint manipulation, DME instructions, Cryotherapy, and Moist heat  PLAN FOR NEXT SESSION: Box Squat with TRX. OMEGA Leg Press and Knee Extension.   Marge Shed PT, DPT  [facility] [address], [place], [address] Phone: [phone]   Fax:  [phone]

## 2023-08-13 NOTE — Patient Instructions (Signed)

## 2023-08-13 NOTE — Progress Notes (Addendum)
   Follow-Up Visit   Subjective  HARLO JASO is a 73 y.o. female who presents for the following: SCCis arising in [place] follow up. Left side of nose. Bx: 03/12/2023. Tx with 5FU/Calcipotriene as directed. Used from 2/2/-04/14/2023. Pt thinks SCCIS has resolved, lesion no longer scaly.  The following portions of the chart were reviewed this encounter and updated as appropriate: medications, allergies, medical history  Review of Systems:  No other skin or systemic complaints except as noted in HPI or Assessment and Plan.  Objective  Well appearing patient in no apparent distress; mood and affect are within normal limits.  A focused examination was performed of the following areas: the face   Relevant exam findings are noted in the Assessment and Plan.    Assessment & Plan   HISTORY OF SQUAMOUS CELL CARCINOMA IN SITU OF THE SKIN  - No evidence of recurrence today. Well-healed biopsy scar on L perinasal - Recommend regular full body skin exams. Offered 6 month skin check. Patient opts to wait a year (Jan 2026) - Call if any new or changing lesions are noted between office visits HISTORY OF SQUAMOUS CELL CARCINOMA IN SITU (SCCIS)    Return for appointment as scheduled.  Arlinda Lais, CMA, am acting as scribe for Harris Liming, MD .   Documentation: I have reviewed the above documentation for accuracy and completeness, and I agree with the above.  Harris Liming, MD

## 2023-08-15 ENCOUNTER — Ambulatory Visit: Admitting: Physical Therapy

## 2023-08-15 ENCOUNTER — Encounter: Payer: Self-pay | Admitting: Physical Therapy

## 2023-08-15 DIAGNOSIS — M25561 Pain in right knee: Secondary | ICD-10-CM | POA: Diagnosis not present

## 2023-08-15 DIAGNOSIS — R262 Difficulty in walking, not elsewhere classified: Secondary | ICD-10-CM

## 2023-08-15 NOTE — Therapy (Signed)
 OUTPATIENT PHYSICAL THERAPY TREATMENT   Patient Name: Terri Melendez MRN: [id number] DOB:1950-06-01, 73 y.o., female Today's Date: 08/15/2023  END OF SESSION:  PT End of Session - 08/15/23 0908     Visit Number 5    Number of Visits 16    Date for PT Re-Evaluation 10/24/23    Authorization Type [employer] 2025    Authorization Time Period 08/01/23-10/24/23    Authorization - Visit Number 5    Authorization - Number of Visits 20    Progress Note Due on Visit 10    PT Start Time 0905    PT Stop Time 0945    PT Time Calculation (min) 40 min    Activity Tolerance Patient tolerated treatment well;No increased pain    Behavior During Therapy [facility] for tasks assessed/performed          Past Medical History:  Diagnosis Date   Actinic keratosis    Cancer (HCC)    basal cell on face in 2000 or 2001   Hx of basal cell carcinoma 2002   right cheek    Hx of basal cell carcinoma 02/11/2020   glabella, Mohs with Dr. 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   Anterior knee pain, right 07/30/2023   Trochanteric bursitis of left hip 04/30/2023   Plantar fasciitis of left foot 03/28/2022   Anemia 01/10/2022   Allergic rhinitis 06/17/2020   GERD with esophagitis 06/17/2020   Personal history of other malignant neoplasm of skin 06/03/2020   Polyarthralgia 12/15/2019   Thoracic aortic atherosclerosis (HCC) 12/15/2019   Coronary atherosclerosis due to calcified coronary lesion (CODE) 12/15/2019   Encounter for preventive health examination 12/13/2018   History of shingles 05/04/2018   Non-functional thyroid  nodule 05/04/2018   Essential hypertension 10/22/2017   Advanced care planning/counseling discussion 10/22/2017    Hypercholesteremia 05/14/2016   OSA on CPAP 10/13/2014    PCP: Thersia Flax, MD  REFERRING PROVIDER: Thersia Flax, MD REFERRING DIAG: Right knee pain  THERAPY DIAG:  Acute pain of right knee  Difficulty in walking, not elsewhere classified  Rationale for Evaluation and Treatment: Rehabilitation ONSET DATE: June 15, 2023  SUBJECTIVE:  SUBJECTIVE STATEMENT: Pt states that he right knee continues to feel improved to point where she is no longer wearing brace or compression sleeve when walking. However, she still feels unsteady when walking up steps and needs to use step to pattern. Pt has been walking for 10-20 min and mostly over level surfaces with exception of yesterday where she walked up and down a hill with little pain and discomfort.   PERTINENT HISTORY: 72yoF referred to OPPT for continued knee pain and difficulty walking following a twisting injury sustains 06/15/23 wth resultant severe pain, difficulty walking, [facility], unremarkable imaging, issued a knee brace, then returned weeks later and received joint injection. Pt denies any clicking, locking, buckling, instability. Pt denies stiffness as a primary symptom. Pain has progressively improved, but remains painful daily, functionally restrictive to stairs performance and prior AMB distances. In the most recent two weeks to evaluation here on 08/01/23 pt has managed her pain with icing at night, intermittent tylenol, intermittent meloxicam  (prescribed for  a different issue), and most recently started a 5 day oral prednisone  course on 07/31/23. Pt recently DC from this clinic for hip pain problem, seen last year here for plantar fasciitis issue. At evlauation patient reports 3/10 current pain , 5/10 worst pain (in past 7 days), and sometimes is pain free at rest. She stopped using the hinged neoprene knee brace daily as of end of May, but uses it ad lib, as well as ad lib compression sleeve.  PAIN:  Are you  having pain? No pain, just soreness.  PRECAUTIONS: None WEIGHT BEARING RESTRICTIONS: no FALLS:  Has patient fallen in last 6 months? no  PATIENT GOALS: return to baseline tolerance of community distance AMB  NEXT MD VISIT: Will FU with PCP after 1 month of OPPT for this issue.   OBJECTIVE:  CIRCUMFERENCIAL EDEMA ASSESSMENT (6/10):   Left knee (center patella): 38.5cm  Right knee (center patella): 39.5cm                                                                                                                             TREATMENT DATE   08/15/23   THEREX   Recumbent bike seat set at 6 for 5 min  Single Leg Stance on RLE 10 sec x 5   Hip Hikes on RLE 1 x 10   THERAC  Flight of Stairs with BUE support using railings: -Alternating Step over Step Ascending   -Alternating Step over Step Descending     -Pt increases speed when stepping down  Step up on 4 inch step leading with RLE 2 x 10 with two finger touch  Step ups on 6 inch step leading with RLE 1 x 10 with two finger touch    PATIENT EDUCATION:  Education details: maintain walking time/distance until symptoms are more stable, predictable. Person educated: patient  Education method: collaborative learning, deliberate practice, positive reinforcement, explicit instruction, establish rules. Education comprehension: good   HOME EXERCISE PROGRAM: Access Code: [email or web address] URL: [email or web address] Date: 08/15/2023 Prepared by: Marge Shed  Exercises - Seated Hamstring Stretch  - 1 x daily - 7 x weekly - 3 reps - 60 sec hold - Reverse Lunge  - 3-4 x weekly - 3 sets - 10 reps - Forward Step Up  - 3-4 x weekly - 3 sets - 10 reps - Standing Hip Hiking  - 3-4 x weekly - 2 sets - 10 reps  ASSESSMENT:  CLINICAL IMPRESSION: Pt progressing towards goals with ability to perform step ups with little to no UE support and without eliciting pain. PT continues to recommend that pt slowly reintroduce physical  activity without much difficulty.  She will continue to benefit from OPPT skilled intervention to improve pain and restore tolerance and participation in activities of IADL and leisure OBJECTIVE IMPAIRMENTS: Abnormal gait, decreased activity tolerance, decreased balance, decreased endurance, decreased mobility, difficulty walking, decreased ROM, decreased strength, and increased edema.  ACTIVITY LIMITATIONS: carrying, bending, standing, squatting, sleeping, stairs,  and transfers PARTICIPATION LIMITATIONS: meal prep, cleaning, driving, shopping, community activity, and yard work PERSONAL FACTORS: Age, Fitness, Past/current experiences, and Time since onset of injury/illness/exacerbation are also affecting patient's functional outcome.  REHAB POTENTIAL: Excellent CLINICAL DECISION MAKING: Evolving/moderate complexity EVALUATION COMPLEXITY: Moderate  GOALS: Goals reviewed with patient? Yes  SHORT TERM GOALS: Target date: 08/31/23 Pt to demonstrate >1572ft without increase in knee pain >1 NPRS, without antalgia.  Baseline: 1,400 ft  Goal status: ONGOING   2.  MMT testing all 5/5 and pain free compared to evaluation pain.  Baseline: pain with Rt knee extension and Rt hip flexion.  Goal status: ONGOING    3.  LEFS score improvement >10%  Baseline:  43.8% 08/01/23 Goal status: ONGOING   4.  5xSTS hands free, standard surface height, symmetrical footing, <12.25sec Baseline: 13.46sec hands free, not pain liimted Goal status: ONGOING    LONG TERM GOALS: Target date: 10/01/23  Pt to demonstrate >1680ft without increase in knee pain >1 NPRS, without antalgia.  Baseline: 1,400 ft   Goal status: ONGOING   2.  MMT testing all 5/5 and pain free compared to evaluation pain.  Baseline: pain with Rt knee extension and Rt hip flexion.  Goal status: ONGOING   3.  LEFS score improvement >25% compared to initital survey  Baseline:  43.8% 08/01/23 Goal status: ONGOING    4.  5xSTS hands free,  standard surface height, symmetrical footing, <10sec Baseline: 13.46sec hands free, not pain liimted Goal status: ONGOING   PLAN: PT FREQUENCY: 1-2x/week PT DURATION: 12 weeks PLANNED INTERVENTIONS: 97750- Physical Performance Testing, 97110-Therapeutic exercises, 97530- Therapeutic activity, [id number]- Neuromuscular re-education, 97535- Self Care, 19147- Manual therapy, [phone]- Gait training, [phone]- Electrical stimulation (unattended), [phone]- Electrical stimulation (manual), Patient/Family education, Balance training, Stair training, Joint mobilization, Joint manipulation, DME instructions, Cryotherapy, and Moist heat  PLAN FOR NEXT SESSION: Assess walking up and down hills using treadmill or walking in parking lot outside. Box Squat with TRX. OMEGA Leg Press and Knee Extension.   Marge Shed PT, DPT  [facility] [address] [place], [address] Phone: [phone]   Fax:  [phone]

## 2023-08-20 ENCOUNTER — Encounter: Payer: Self-pay | Admitting: Physical Therapy

## 2023-08-20 ENCOUNTER — Ambulatory Visit: Admitting: Physical Therapy

## 2023-08-20 ENCOUNTER — Telehealth: Payer: Self-pay | Admitting: Internal Medicine

## 2023-08-20 DIAGNOSIS — M25561 Pain in right knee: Secondary | ICD-10-CM | POA: Diagnosis not present

## 2023-08-20 DIAGNOSIS — R262 Difficulty in walking, not elsewhere classified: Secondary | ICD-10-CM

## 2023-08-20 NOTE — Therapy (Signed)
 OUTPATIENT PHYSICAL THERAPY TREATMENT   Patient Name: Terri Melendez MRN: [id number] DOB:1950/08/28, 73 y.o., female Today's Date: 08/20/2023  END OF SESSION:  PT End of Session - 08/20/23 0909     Visit Number 6    Number of Visits 16    Date for PT Re-Evaluation 10/24/23    Authorization Type [employer] 2025    Authorization Time Period 08/01/23-10/24/23    Authorization - Visit Number 6    Authorization - Number of Visits 20    Progress Note Due on Visit 10    PT Start Time 0905    PT Stop Time 0945    PT Time Calculation (min) 40 min    Activity Tolerance Patient tolerated treatment well;No increased pain    Behavior During Therapy [facility] for tasks assessed/performed          Past Medical History:  Diagnosis Date   Actinic keratosis    Cancer (HCC)    basal cell on face in 2000 or 2001   Hx of basal cell carcinoma 2002   right cheek    Hx of basal cell carcinoma 02/11/2020   glabella, Mohs with Dr. 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   Anterior knee pain, right 07/30/2023   Trochanteric bursitis of left hip 04/30/2023   Plantar fasciitis of left foot 03/28/2022   Anemia 01/10/2022   Allergic rhinitis 06/17/2020   GERD with esophagitis 06/17/2020   Personal history of other malignant neoplasm of skin 06/03/2020   Polyarthralgia 12/15/2019   Thoracic aortic atherosclerosis (HCC) 12/15/2019   Coronary atherosclerosis due to calcified coronary lesion (CODE) 12/15/2019   Encounter for preventive health examination 12/13/2018   History of shingles 05/04/2018   Non-functional thyroid  nodule 05/04/2018   Essential hypertension 10/22/2017   Advanced care planning/counseling discussion 10/22/2017    Hypercholesteremia 05/14/2016   OSA on CPAP 10/13/2014    PCP: Marylynn Verneita CROME, MD  REFERRING PROVIDER: Marylynn Verneita CROME, MD REFERRING DIAG: Right knee pain  THERAPY DIAG:  Acute pain of right knee  Difficulty in walking, not elsewhere classified  Rationale for Evaluation and Treatment: Rehabilitation ONSET DATE: June 15, 2023  SUBJECTIVE:  SUBJECTIVE STATEMENT: Pt reports increased pain in the medial joint line of the right knee at the beginning of session and she thinks this is due to completing step ups.   PERTINENT HISTORY: 72yoF referred to OPPT for continued knee pain and difficulty walking following a twisting injury sustains 06/15/23 wth resultant severe pain, difficulty walking, [facility], unremarkable imaging, issued a knee brace, then returned weeks later and received joint injection. Pt denies any clicking, locking, buckling, instability. Pt denies stiffness as a primary symptom. Pain has progressively improved, but remains painful daily, functionally restrictive to stairs performance and prior AMB distances. In the most recent two weeks to evaluation here on 08/01/23 pt has managed her pain with icing at night, intermittent tylenol, intermittent meloxicam  (prescribed for a different issue), and most recently started a 5 day oral prednisone  course on 07/31/23. Pt recently DC from this clinic for hip pain problem, seen last year here for plantar fasciitis issue. At evlauation patient reports 3/10 current pain ,  5/10 worst pain (in past 7 days), and sometimes is pain free at rest. She stopped using the hinged neoprene knee brace daily as of end of May, but uses it ad lib, as well as ad lib compression sleeve.  PAIN:  Are you having pain? No pain, just soreness.  PRECAUTIONS: None WEIGHT BEARING RESTRICTIONS: no FALLS:  Has patient fallen in last 6 months? no  PATIENT GOALS: return to baseline tolerance of community distance AMB  NEXT MD VISIT: Will FU with  PCP after 1 month of OPPT for this issue.   OBJECTIVE:  CIRCUMFERENCIAL EDEMA ASSESSMENT (6/10):   Left knee (center patella): 38.5cm  Right knee (center patella): 39.5cm                                                                                                                             TREATMENT DATE   08/20/23 THEREX   Recumbent bike seat set at 6 for 5 min Left Side Lying Clam Shells with Blue Band 1 x 10  Spanish Goblet Squat with Gray Band 1 x 10   -Pt reports increased pain behind right knee  Spanish Goblet Squat with Black Rogue band 1 x 10  Spanish Goblet Squat with Black Rogue band and 2 ft platform behind pt for external cue for pt to perform posterior weight shift  1 x 10  -min VC to maintain knees behind toes.  Terminal knee extension black band on RLE  2 x 10   -mod VC to maintain extended knee on LLE   08/15/23   THEREX   Recumbent bike seat set at 6 for 5 min  Single Leg Stance on RLE 10 sec x 5   Hip Hikes on RLE 1 x 10     PATIENT EDUCATION:  Education details: maintain walking time/distance until symptoms are more stable, predictable. Person educated: patient  Education method: collaborative learning, deliberate practice, positive reinforcement, explicit instruction, establish rules. Education comprehension: good   HOME EXERCISE PROGRAM: Access Code: [email or web address] URL: [email or web address] Date: 08/20/2023 Prepared by: Toribio Servant  Exercises - Seated Hamstring Stretch  - 1 x daily - 7 x weekly - 3 reps - 60 sec hold - Reverse Lunge  - 3-4 x weekly - 3 sets - 10 reps - Clam with Resistance  - 3-4 x weekly - 3 sets - 10 reps - Standing Terminal Knee Extension with Resistance  - 3-4 x weekly - 3 sets - 10 reps  ASSESSMENT:  CLINICAL IMPRESSION: Due to pt's high initial pain in medial joint line of right knee, modified exercises during session to include no step ups and decrease knee flexion <=90 degrees to avoid exacerbation. Pt has  slight increased internal rotation of knee, so exercises focused on increasing hip external rotators. Pt was able to perform all exercises without an increase in his right knee pain. She will continue to benefit from OPPT skilled intervention to improve pain and restore tolerance and participation  in activities of IADL and leisure  OBJECTIVE IMPAIRMENTS: Abnormal gait, decreased activity tolerance, decreased balance, decreased endurance, decreased mobility, difficulty walking, decreased ROM, decreased strength, and increased edema.  ACTIVITY LIMITATIONS: carrying, bending, standing, squatting, sleeping, stairs, and transfers PARTICIPATION LIMITATIONS: meal prep, cleaning, driving, shopping, community activity, and yard work PERSONAL FACTORS: Age, Fitness, Past/current experiences, and Time since onset of injury/illness/exacerbation are also affecting patient's functional outcome.  REHAB POTENTIAL: Excellent CLINICAL DECISION MAKING: Evolving/moderate complexity EVALUATION COMPLEXITY: Moderate  GOALS: Goals reviewed with patient? Yes  SHORT TERM GOALS: Target date: 08/31/23 Pt to demonstrate >1549ft without increase in knee pain >1 NPRS, without antalgia.  Baseline: 1,400 ft  Goal status: ONGOING   2.  MMT testing all 5/5 and pain free compared to evaluation pain.  Baseline: pain with Rt knee extension and Rt hip flexion.  Goal status: ONGOING    3.  LEFS score improvement >10%  Baseline:  43.8% 08/01/23 Goal status: ONGOING   4.  5xSTS hands free, standard surface height, symmetrical footing, <12.25sec Baseline: 13.46sec hands free, not pain liimted Goal status: ONGOING    LONG TERM GOALS: Target date: 10/01/23  Pt to demonstrate >162ft without increase in knee pain >1 NPRS, without antalgia.  Baseline: 1,400 ft   Goal status: ONGOING   2.  MMT testing all 5/5 and pain free compared to evaluation pain.  Baseline: pain with Rt knee extension and Rt hip flexion.  Goal status:  ONGOING   3.  LEFS score improvement >25% compared to initital survey  Baseline:  43.8% 08/01/23 Goal status: ONGOING    4.  5xSTS hands free, standard surface height, symmetrical footing, <10sec Baseline: 13.46sec hands free, not pain liimted Goal status: ONGOING   PLAN: PT FREQUENCY: 1-2x/week PT DURATION: 12 weeks PLANNED INTERVENTIONS: 97750- Physical Performance Testing, 97110-Therapeutic exercises, 97530- Therapeutic activity, [id number]- Neuromuscular re-education, 97535- Self Care, 02859- Manual therapy, [phone]- Gait training, [phone]- Electrical stimulation (unattended), [phone]- Electrical stimulation (manual), Patient/Family education, Balance training, Stair training, Joint mobilization, Joint manipulation, DME instructions, Cryotherapy, and Moist heat  PLAN FOR NEXT SESSION: Assess walking up and down hills using treadmill or walking in parking lot outside. Box Squat with TRX. OMEGA Leg Press and Knee Extension. Banded side step or monster walks .   Toribio Servant PT, DPT  [facility] [address] [place], [address] Phone: [phone]   Fax:  [phone]

## 2023-08-20 NOTE — Telephone Encounter (Signed)
 Patient need additional lab ordered via notes.

## 2023-08-22 ENCOUNTER — Ambulatory Visit: Admitting: Physical Therapy

## 2023-08-22 DIAGNOSIS — R262 Difficulty in walking, not elsewhere classified: Secondary | ICD-10-CM

## 2023-08-22 DIAGNOSIS — M25561 Pain in right knee: Secondary | ICD-10-CM | POA: Diagnosis not present

## 2023-08-22 NOTE — Therapy (Signed)
 OUTPATIENT PHYSICAL THERAPY TREATMENT   Patient Name: Terri Melendez MRN: [id number] DOB:11/23/1950, 73 y.o., female Today's Date: 08/22/2023  END OF SESSION:  PT End of Session - 08/22/23 0909     Visit Number 7    Number of Visits 16    Date for PT Re-Evaluation 10/24/23    Authorization Type [employer] 2025    Authorization Time Period 08/01/23-10/24/23    Authorization - Visit Number 7    Authorization - Number of Visits 20    Progress Note Due on Visit 10    PT Start Time 0905    PT Stop Time 0945    PT Time Calculation (min) 40 min    Activity Tolerance Patient tolerated treatment well;No increased pain    Behavior During Therapy [facility] for tasks assessed/performed           Past Medical History:  Diagnosis Date   Actinic keratosis    Cancer (HCC)    basal cell on face in 2000 or 2001   Hx of basal cell carcinoma 2002   right cheek    Hx of basal cell carcinoma 02/11/2020   glabella, Mohs with Dr. 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   Anterior knee pain, right 07/30/2023   Trochanteric bursitis of left hip 04/30/2023   Plantar fasciitis of left foot 03/28/2022   Anemia 01/10/2022   Allergic rhinitis 06/17/2020   GERD with esophagitis 06/17/2020   Personal history of other malignant neoplasm of skin 06/03/2020   Polyarthralgia 12/15/2019   Thoracic aortic atherosclerosis (HCC) 12/15/2019   Coronary atherosclerosis due to calcified coronary lesion (CODE) 12/15/2019   Encounter for preventive health examination 12/13/2018   History of shingles 05/04/2018   Non-functional thyroid  nodule 05/04/2018   Essential hypertension 10/22/2017   Advanced care planning/counseling discussion 10/22/2017    Hypercholesteremia 05/14/2016   OSA on CPAP 10/13/2014    PCP: Marylynn Verneita CROME, MD  REFERRING PROVIDER: Marylynn Verneita CROME, MD REFERRING DIAG: Right knee pain  THERAPY DIAG:  No diagnosis found.  Rationale for Evaluation and Treatment: Rehabilitation ONSET DATE: June 15, 2023  SUBJECTIVE:  SUBJECTIVE STATEMENT: Pt reports ongoing increased medial joint line pain on right knee. She felt this mostly at night when she was trying to go to sleep   PERTINENT HISTORY: 72yoF referred to OPPT for continued knee pain and difficulty walking following a twisting injury sustains 06/15/23 wth resultant severe pain, difficulty walking, [facility], unremarkable imaging, issued a knee brace, then returned weeks later and received joint injection. Pt denies any clicking, locking, buckling, instability. Pt denies stiffness as a primary symptom. Pain has progressively improved, but remains painful daily, functionally restrictive to stairs performance and prior AMB distances. In the most recent two weeks to evaluation here on 08/01/23 pt has managed her pain with icing at night, intermittent tylenol, intermittent meloxicam  (prescribed for a different issue), and most recently started a 5 day oral prednisone  course on 07/31/23. Pt recently DC from this clinic for hip pain problem, seen last year here for plantar fasciitis issue. At evlauation patient reports 3/10 current pain , 5/10 worst pain (in past 7 days), and sometimes is pain  free at rest. She stopped using the hinged neoprene knee brace daily as of end of May, but uses it ad lib, as well as ad lib compression sleeve.  PAIN:  Are you having pain? No pain, just soreness.  PRECAUTIONS: None WEIGHT BEARING RESTRICTIONS: no FALLS:  Has patient fallen in last 6 months? no  PATIENT GOALS: return to baseline tolerance of community distance AMB  NEXT MD VISIT: Will FU with PCP after 1 month of OPPT for this issue.   OBJECTIVE:   CIRCUMFERENCIAL EDEMA ASSESSMENT (6/10):   Left knee (center patella): 38.5cm  Right knee (center patella): 39.5cm                                                                                                                             TREATMENT DATE   08/22/23: THEREX  MATRIX  Recumbent Bicycle with seat at 5 for 5 min  PT provides compression against lateral joint line while performing squats -Pt reports improvement in medial  Seated RLE HS and calf stretch with external rotation 3 x 60 sec   Squats x 5   -Pt reports improved tension on medial joint line of right knee    SELF CARE HOME MANAGEMENT    Education about lateral offloading knee brace to provide relief for medial joint line pain 5 reps   MANUAL  Medial hamstring tendon stretch on RLE  4 x 60 sec with contract relax   Massage and trigger point release of musculare around medial joint line  -posterior medial surface over medial hamstring tendon is TTP     PATIENT EDUCATION:  Education details: maintain walking time/distance until symptoms are more stable, predictable. Person educated: patient  Education method: collaborative learning, deliberate practice, positive reinforcement, explicit instruction, establish rules. Education comprehension: good   HOME EXERCISE PROGRAM: Access Code: [email or web address] URL: [email or web address] Date: 08/22/2023 Prepared by: Toribio Servant  Exercises - Long Sitting Calf Stretch with Strap  - 1 x daily - 7 x weekly - 3 reps - 30-60 sec hold - Reverse Lunge  - 3-4 x weekly - 3 sets - 10 reps - Clam with Resistance  - 3-4 x weekly - 3 sets - 10 reps - Standing Terminal Knee Extension with Resistance  - 3-4 x weekly - 3 sets - 10 reps  ASSESSMENT:  CLINICAL IMPRESSION: Pt shows increased muscular tension and pain around medial joint line of right knee that is likely result of increased tightness in hamstrings and ongoing medial joint line OA. She did report relief after  performing stretching of medial hamstring tendons. Pt is nearing end of plan of care with not much relief in joint line pain. PT discussed further bracing options with medial offloader brace and that if she continued to experience this pain by 10th visit that further medical eval and treatment would be warranted. She will continue to benefit from OPPT skilled intervention to improve pain and restore tolerance and participation in activities of  IADL and leisure   OBJECTIVE IMPAIRMENTS: Abnormal gait, decreased activity tolerance, decreased balance, decreased endurance, decreased mobility, difficulty walking, decreased ROM, decreased strength, and increased edema.  ACTIVITY LIMITATIONS: carrying, bending, standing, squatting, sleeping, stairs, and transfers PARTICIPATION LIMITATIONS: meal prep, cleaning, driving, shopping, community activity, and yard work PERSONAL FACTORS: Age, Fitness, Past/current experiences, and Time since onset of injury/illness/exacerbation are also affecting patient's functional outcome.  REHAB POTENTIAL: Excellent CLINICAL DECISION MAKING: Evolving/moderate complexity EVALUATION COMPLEXITY: Moderate  GOALS: Goals reviewed with patient? Yes  SHORT TERM GOALS: Target date: 08/31/23 Pt to demonstrate >1568ft without increase in knee pain >1 NPRS, without antalgia.  Baseline: 1,400 ft  Goal status: ONGOING   2.  MMT testing all 5/5 and pain free compared to evaluation pain.  Baseline: pain with Rt knee extension and Rt hip flexion.  Goal status: ONGOING    3.  LEFS score improvement >10%  Baseline:  43.8% 08/01/23 Goal status: ONGOING   4.  5xSTS hands free, standard surface height, symmetrical footing, <12.25sec Baseline: 13.46sec hands free, not pain liimted Goal status: ONGOING    LONG TERM GOALS: Target date: 10/01/23  Pt to demonstrate >1677ft without increase in knee pain >1 NPRS, without antalgia.  Baseline: 1,400 ft   Goal status: ONGOING   2.  MMT  testing all 5/5 and pain free compared to evaluation pain.  Baseline: pain with Rt knee extension and Rt hip flexion.  Goal status: ONGOING   3.  LEFS score improvement >25% compared to initital survey  Baseline:  43.8% 08/01/23 Goal status: ONGOING    4.  5xSTS hands free, standard surface height, symmetrical footing, <10sec Baseline: 13.46sec hands free, not pain liimted Goal status: ONGOING   PLAN: PT FREQUENCY: 1-2x/week PT DURATION: 12 weeks PLANNED INTERVENTIONS: 97750- Physical Performance Testing, 97110-Therapeutic exercises, 97530- Therapeutic activity, [id number]- Neuromuscular re-education, 97535- Self Care, 02859- Manual therapy, [id number]- Gait training, [phone]- Electrical stimulation (unattended), [phone]- Electrical stimulation (manual), Patient/Family education, Balance training, Stair training, Joint mobilization, Joint manipulation, DME instructions, Cryotherapy, and Moist heat  PLAN FOR NEXT SESSION: Ongoing hamstring stretching and calf stretching. Strengthening calf and hamstring. Box Squat with TRX. OMEGA Leg Press and Knee Extension. Banded side step or monster walks .   Toribio Servant PT, DPT  [facility] [address] [place], [address] Phone: [phone]   Fax:  [phone]

## 2023-08-27 ENCOUNTER — Ambulatory Visit: Attending: Internal Medicine | Admitting: Physical Therapy

## 2023-08-27 DIAGNOSIS — R262 Difficulty in walking, not elsewhere classified: Secondary | ICD-10-CM | POA: Diagnosis present

## 2023-08-27 DIAGNOSIS — M25552 Pain in left hip: Secondary | ICD-10-CM | POA: Insufficient documentation

## 2023-08-27 DIAGNOSIS — M25561 Pain in right knee: Secondary | ICD-10-CM | POA: Insufficient documentation

## 2023-08-27 NOTE — Therapy (Signed)
 OUTPATIENT PHYSICAL THERAPY TREATMENT   Patient Name: Terri Melendez MRN: [id number] DOB:05-Jun-1950, 73 y.o., female Today's Date: 08/27/2023  END OF SESSION:  PT End of Session - 08/27/23 0952     Visit Number 8    Number of Visits 16    Date for PT Re-Evaluation 10/24/23    Authorization Type [employer] 2025    Authorization Time Period 08/01/23-10/24/23    Authorization - Visit Number 8    Authorization - Number of Visits 20    Progress Note Due on Visit 10    PT Start Time 0950    PT Stop Time 1030    PT Time Calculation (min) 40 min    Activity Tolerance Patient tolerated treatment well;No increased pain    Behavior During Therapy [facility] for tasks assessed/performed            Past Medical History:  Diagnosis Date   Actinic keratosis    Cancer (HCC)    basal cell on face in 2000 or 2001   Hx of basal cell carcinoma 2002   right cheek    Hx of basal cell carcinoma 02/11/2020   glabella, Mohs with Dr. 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   Anterior knee pain, right 07/30/2023   Trochanteric bursitis of left hip 04/30/2023   Plantar fasciitis of left foot 03/28/2022   Anemia 01/10/2022   Allergic rhinitis 06/17/2020   GERD with esophagitis 06/17/2020   Personal history of other malignant neoplasm of skin 06/03/2020   Polyarthralgia 12/15/2019   Thoracic aortic atherosclerosis (HCC) 12/15/2019   Coronary atherosclerosis due to calcified coronary lesion (CODE) 12/15/2019   Encounter for preventive health examination 12/13/2018   History of shingles 05/04/2018   Non-functional thyroid  nodule 05/04/2018   Essential hypertension 10/22/2017   Advanced care planning/counseling discussion 10/22/2017    Hypercholesteremia 05/14/2016   OSA on CPAP 10/13/2014    PCP: Marylynn Verneita CROME, MD  REFERRING PROVIDER: Marylynn Verneita CROME, MD REFERRING DIAG: Right knee pain  THERAPY DIAG:  Acute pain of right knee  Difficulty in walking, not elsewhere classified  Rationale for Evaluation and Treatment: Rehabilitation ONSET DATE: June 15, 2023  SUBJECTIVE:  SUBJECTIVE STATEMENT: Pt reports her right knee continues to feel improved even after not using her brace.   PERTINENT HISTORY: 72yoF referred to OPPT for continued knee pain and difficulty walking following a twisting injury sustains 06/15/23 wth resultant severe pain, difficulty walking, [facility], unremarkable imaging, issued a knee brace, then returned weeks later and received joint injection. Pt denies any clicking, locking, buckling, instability. Pt denies stiffness as a primary symptom. Pain has progressively improved, but remains painful daily, functionally restrictive to stairs performance and prior AMB distances. In the most recent two weeks to evaluation here on 08/01/23 pt has managed her pain with icing at night, intermittent tylenol, intermittent meloxicam  (prescribed for a different issue), and most recently started a 5 day oral prednisone  course on 07/31/23. Pt recently DC from this clinic for hip pain problem, seen last year here for plantar fasciitis issue. At evlauation patient reports 3/10 current pain , 5/10 worst pain (in past 7 days), and sometimes is pain  free at rest. She stopped using the hinged neoprene knee brace daily as of end of May, but uses it ad lib, as well as ad lib compression sleeve.  PAIN:  Are you having pain? 1-2 /10 NRPS Medial joint line of right knee    PRECAUTIONS: None WEIGHT BEARING RESTRICTIONS: no FALLS:  Has patient fallen in last 6 months? no  PATIENT GOALS: return to baseline tolerance of community distance AMB  NEXT MD VISIT: Will FU with PCP after 1 month of OPPT for this  issue.   OBJECTIVE:  CIRCUMFERENCIAL EDEMA ASSESSMENT (6/10):   Left knee (center patella): 38.5cm  Right knee (center patella): 39.5cm                                                                                                                             TREATMENT DATE   08/27/23: THEREX   MATRIX  Recumbent Bicycle with seat at 5 for 5 min  Quarter Squat with green band around knees for hip ER activation 1 X 10  -28 inch mat height   Quarter Squat with green band around knees for hip ER activation 1 x 10   -27 inch mat height  Quarter Squat with green band around knees for hip ER activation 1 x 10  -26 inch mat height   Standing Calf Stretch on step with hip internal rotation stretch to bias medial head of gastroc 2 x 60 sec   Standing Heel Raises with 1 UE support 1 x 10   Standing Heel Raises with no UE support 1 x 10   Standing Heel Raises with 1 UE support and small ball squeeze with rear foot for medial calf and ankle inverter bias 1 x 10   Standing Heel Raises with no UE support and small ball squeeze with rear foot for medial calf and ankle inverter bias 1 x 10   Left Side Lying Hip Abduction 1 x 10  -min    PATIENT EDUCATION:  Education details: maintain walking time/distance until symptoms are more stable, predictable. Person educated: patient  Education method: collaborative learning, deliberate practice, positive reinforcement, explicit instruction, establish rules. Education comprehension: good   HOME EXERCISE PROGRAM: Access Code: [email or web address] URL: [email or web address] Date: 08/27/2023 Prepared by: Toribio Servant  Exercises - Long Sitting Calf Stretch with Strap  - 1 x daily - 7 x weekly - 3 reps - 30-60 sec hold - Standing Gastroc Stretch on Step  - 1 x daily - 7 x weekly - 3 reps - 30-60 sec hold - Squat with Chair Touch and Resistance Loop  - 3-4 x weekly - 3 sets - 10 reps - Standing Calf Raise With Small Ball at Heels  - 3-4 x weekly - 3 sets  - 15 reps - Sidelying Hip Abduction  - 3-4 x weekly - 3 sets - 10 reps  ASSESSMENT:  CLINICAL IMPRESSION: Pt continues to progress towards goals with a decrease in right medial knee  pain while performing weight bearing activities and increased knee flexion. She shows improvement in right hip strength with ability to perform side lying hip abduction without compensation. If she continues to show decreased pain response with activity, then patient will likely discharge in the next several sessions. She will continue to benefit from OPPT skilled intervention to improve pain and restore tolerance and participation in activities of IADL and leisure   OBJECTIVE IMPAIRMENTS: Abnormal gait, decreased activity tolerance, decreased balance, decreased endurance, decreased mobility, difficulty walking, decreased ROM, decreased strength, and increased edema.  ACTIVITY LIMITATIONS: carrying, bending, standing, squatting, sleeping, stairs, and transfers PARTICIPATION LIMITATIONS: meal prep, cleaning, driving, shopping, community activity, and yard work PERSONAL FACTORS: Age, Fitness, Past/current experiences, and Time since onset of injury/illness/exacerbation are also affecting patient's functional outcome.  REHAB POTENTIAL: Excellent CLINICAL DECISION MAKING: Evolving/moderate complexity EVALUATION COMPLEXITY: Moderate  GOALS: Goals reviewed with patient? Yes  SHORT TERM GOALS: Target date: 08/31/23 Pt to demonstrate >1554ft without increase in knee pain >1 NPRS, without antalgia.  Baseline: 1,400 ft  Goal status: ONGOING   2.  MMT testing all 5/5 and pain free compared to evaluation pain.  Baseline: pain with Rt knee extension and Rt hip flexion.  Goal status: ONGOING    3.  LEFS score improvement >10%  Baseline:  43.8% 08/01/23 Goal status: ONGOING   4.  5xSTS hands free, standard surface height, symmetrical footing, <12.25sec Baseline: 13.46sec hands free, not pain liimted Goal status:  ONGOING    LONG TERM GOALS: Target date: 10/01/23  Pt to demonstrate >1662ft without increase in knee pain >1 NPRS, without antalgia.  Baseline: 1,400 ft   Goal status: ONGOING   2.  MMT testing all 5/5 and pain free compared to evaluation pain.  Baseline: pain with Rt knee extension and Rt hip flexion.  Goal status: ONGOING   3.  LEFS score improvement >25% compared to initital survey  Baseline:  43.8% 08/01/23 Goal status: ONGOING    4.  5xSTS hands free, standard surface height, symmetrical footing, <10sec Baseline: 13.46sec hands free, not pain liimted Goal status: ONGOING   PLAN: PT FREQUENCY: 1-2x/week PT DURATION: 12 weeks PLANNED INTERVENTIONS: 97750- Physical Performance Testing, 97110-Therapeutic exercises, 97530- Therapeutic activity, [id number]- Neuromuscular re-education, 97535- Self Care, 02859- Manual therapy, [id number]- Gait training, [phone]- Electrical stimulation (unattended), [phone]- Electrical stimulation (manual), Patient/Family education, Balance training, Stair training, Joint mobilization, Joint manipulation, DME instructions, Cryotherapy, and Moist heat  PLAN FOR NEXT SESSION:  TM warm up. Banded side step or monster walks. Heel raises on step with small ball squeeze.   Toribio Servant PT, DPT  [facility] [address], [place], [address] Phone: [phone]   Fax:  [phone]

## 2023-08-29 ENCOUNTER — Ambulatory Visit: Admitting: Physical Therapy

## 2023-08-29 ENCOUNTER — Encounter: Payer: Self-pay | Admitting: Physical Therapy

## 2023-08-29 DIAGNOSIS — M25561 Pain in right knee: Secondary | ICD-10-CM | POA: Diagnosis not present

## 2023-08-29 DIAGNOSIS — R262 Difficulty in walking, not elsewhere classified: Secondary | ICD-10-CM

## 2023-08-29 NOTE — Therapy (Signed)
 OUTPATIENT PHYSICAL THERAPY TREATMENT   Patient Name: Terri Melendez MRN: [id number] DOB:20-May-1950, 73 y.o., female Today's Date: 08/29/2023  END OF SESSION:  PT End of Session - 08/29/23 0952     Visit Number 9    Number of Visits 16    Date for PT Re-Evaluation 10/24/23    Authorization Type [employer] 2025    Authorization Time Period 08/01/23-10/24/23    Authorization - Visit Number 9    Authorization - Number of Visits 20    Progress Note Due on Visit 10    PT Start Time 0950    PT Stop Time 1030    PT Time Calculation (min) 40 min    Activity Tolerance Patient tolerated treatment well;No increased pain    Behavior During Therapy [facility] for tasks assessed/performed            Past Medical History:  Diagnosis Date   Actinic keratosis    Cancer (HCC)    basal cell on face in 2000 or 2001   Hx of basal cell carcinoma 2002   right cheek    Hx of basal cell carcinoma 02/11/2020   glabella, Mohs with Dr. 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   Anterior knee pain, right 07/30/2023   Trochanteric bursitis of left hip 04/30/2023   Plantar fasciitis of left foot 03/28/2022   Anemia 01/10/2022   Allergic rhinitis 06/17/2020   GERD with esophagitis 06/17/2020   Personal history of other malignant neoplasm of skin 06/03/2020   Polyarthralgia 12/15/2019   Thoracic aortic atherosclerosis (HCC) 12/15/2019   Coronary atherosclerosis due to calcified coronary lesion (CODE) 12/15/2019   Encounter for preventive health examination 12/13/2018   History of shingles 05/04/2018   Non-functional thyroid  nodule 05/04/2018   Essential hypertension 10/22/2017   Advanced care planning/counseling discussion 10/22/2017    Hypercholesteremia 05/14/2016   OSA on CPAP 10/13/2014    PCP: Marylynn Verneita CROME, MD  REFERRING PROVIDER: Marylynn Verneita CROME, MD REFERRING DIAG: Right knee pain  THERAPY DIAG:  Acute pain of right knee  Difficulty in walking, not elsewhere classified  Rationale for Evaluation and Treatment: Rehabilitation ONSET DATE: June 15, 2023  SUBJECTIVE:  SUBJECTIVE STATEMENT: Pt states some stiffness in right knee put otherwise she has been able to do exercises without increased pain.   PERTINENT HISTORY: 72yoF referred to OPPT for continued knee pain and difficulty walking following a twisting injury sustains 06/15/23 wth resultant severe pain, difficulty walking, [facility], unremarkable imaging, issued a knee brace, then returned weeks later and received joint injection. Pt denies any clicking, locking, buckling, instability. Pt denies stiffness as a primary symptom. Pain has progressively improved, but remains painful daily, functionally restrictive to stairs performance and prior AMB distances. In the most recent two weeks to evaluation here on 08/01/23 pt has managed her pain with icing at night, intermittent tylenol, intermittent meloxicam  (prescribed for a different issue), and most recently started a 5 day oral prednisone  course on 07/31/23. Pt recently DC from this clinic for hip pain problem, seen last year here for plantar fasciitis issue. At evlauation patient reports 3/10 current pain , 5/10 worst pain (in past 7 days),  and sometimes is pain free at rest. She stopped using the hinged neoprene knee brace daily as of end of May, but uses it ad lib, as well as ad lib compression sleeve.  PAIN:  Are you having pain? 1-2 /10 NRPS Medial joint line of right knee    PRECAUTIONS: None WEIGHT BEARING RESTRICTIONS: no FALLS:  Has patient fallen in last 6 months? no  PATIENT GOALS: return to baseline tolerance of community distance AMB  NEXT MD VISIT: Will FU with PCP after 1  month of OPPT for this issue.   OBJECTIVE:  CIRCUMFERENCIAL EDEMA ASSESSMENT (6/10):   Left knee (center patella): 38.5cm  Right knee (center patella): 39.5cm                                                                                                                             TREATMENT DATE   08/29/23 TM at 1.5 mph with BUE support Seated HS Stretch on RLE with heel on 6 inch step and strap to pull toes back 3 x 60 sec   Banded Side Step with red band 11 ft 2 x 10  Supine Hip Adduction PROM R/L 30/30  -More restriction on RLE  Supine Butterfly Hip Adduction Stretch 2 x 60 sec  Standing Hip Adduction Stretch for RLE  3  x 60 sec   Reviewed home exercise plan and modified accordingly.    PATIENT EDUCATION:  Education details: maintain walking time/distance until symptoms are more stable, predictable. Person educated: patient  Education method: collaborative learning, deliberate practice, positive reinforcement, explicit instruction, establish rules. Education comprehension: good   HOME EXERCISE PROGRAM: Access Code: [email or web address] URL: [email or web address] Date: 08/29/2023 Prepared by: Toribio Servant  Exercises - Seated Hamstring Stretch with Strap  - 1 x daily - 7 x weekly - 3 reps - 60 sec  hold - Lateral Lunge Adductor Stretch with Counter Support  - 1 x daily - 7 x weekly - 3 reps - 30-60 sec  hold - Standing Gastroc Stretch on Step  - 1 x daily - 7 x weekly - 3 reps - 30-60 sec hold - Squat with Chair Touch and Resistance Loop  - 3-4 x weekly - 3 sets - 10 reps - Side Stepping with Resistance at Ankles  - 3-4 x weekly - 2 sets - 10 reps  ASSESSMENT:  CLINICAL IMPRESSION: Pt progressing towards goals with little to no increase in pain in response to activity even with ongoing progression to weight bearing exercises. However, pt has been continuing to take NSAIDs for the past several months, so it is unclear whether she will have increased pain if she does not  take this medication. PT continued to promote hip external rotation of hips to decrease valgus force promoting medial joint line pain on right knee. She will continue to benefit from OPPT skilled intervention to improve pain and restore tolerance and participation in activities of IADL and leisure.   OBJECTIVE IMPAIRMENTS: Abnormal  gait, decreased activity tolerance, decreased balance, decreased endurance, decreased mobility, difficulty walking, decreased ROM, decreased strength, and increased edema.  ACTIVITY LIMITATIONS: carrying, bending, standing, squatting, sleeping, stairs, and transfers PARTICIPATION LIMITATIONS: meal prep, cleaning, driving, shopping, community activity, and yard work PERSONAL FACTORS: Age, Fitness, Past/current experiences, and Time since onset of injury/illness/exacerbation are also affecting patient's functional outcome.  REHAB POTENTIAL: Excellent CLINICAL DECISION MAKING: Evolving/moderate complexity EVALUATION COMPLEXITY: Moderate  GOALS: Goals reviewed with patient? Yes  SHORT TERM GOALS: Target date: 08/31/23 Pt to demonstrate >1546ft without increase in knee pain >1 NPRS, without antalgia.  Baseline: 1,400 ft  Goal status: ONGOING   2.  MMT testing all 5/5 and pain free compared to evaluation pain.  Baseline: pain with Rt knee extension and Rt hip flexion.  Goal status: ONGOING    3.  LEFS score improvement >10%  Baseline:  43.8% 08/01/23 Goal status: ONGOING   4.  5xSTS hands free, standard surface height, symmetrical footing, <12.25sec Baseline: 13.46sec hands free, not pain liimted Goal status: ONGOING    LONG TERM GOALS: Target date: 10/01/23  Pt to demonstrate >1659ft without increase in knee pain >1 NPRS, without antalgia.  Baseline: 1,400 ft   Goal status: ONGOING   2.  MMT testing all 5/5 and pain free compared to evaluation pain.  Baseline: pain with Rt knee extension and Rt hip flexion.  Goal status: ONGOING   3.  LEFS score  improvement >25% compared to initital survey  Baseline:  43.8% 08/01/23 Goal status: ONGOING    4.  5xSTS hands free, standard surface height, symmetrical footing, <10sec Baseline: 13.46sec hands free, not pain liimted Goal status: ONGOING   PLAN: PT FREQUENCY: 1-2x/week PT DURATION: 12 weeks PLANNED INTERVENTIONS: 97750- Physical Performance Testing, 97110-Therapeutic exercises, 97530- Therapeutic activity, [id number]- Neuromuscular re-education, 97535- Self Care, 02859- Manual therapy, [id number]- Gait training, [phone]- Electrical stimulation (unattended), [phone]- Electrical stimulation (manual), Patient/Family education, Balance training, Stair training, Joint mobilization, Joint manipulation, DME instructions, Cryotherapy, and Moist heat  PLAN FOR NEXT SESSION:  Ongoing TM warm . Reassess goals remainder of goals: LEFS, 5 x STS, right hip MMT. Revisit calf raises. SLR with hip external rotation  Toribio Servant PT, DPT  [facility] [address] [place], [address] Phone: [phone]   Fax:  [phone]

## 2023-09-02 ENCOUNTER — Other Ambulatory Visit (INDEPENDENT_AMBULATORY_CARE_PROVIDER_SITE_OTHER)

## 2023-09-02 DIAGNOSIS — R944 Abnormal results of kidney function studies: Secondary | ICD-10-CM

## 2023-09-02 LAB — RENAL FUNCTION PANEL
Albumin: 4.1 g/dL (ref 3.5–5.2)
BUN: 23 mg/dL (ref 6–23)
CO2: 31 meq/L (ref 19–32)
Calcium: 9.3 mg/dL (ref 8.4–10.5)
Chloride: 105 meq/L (ref 96–112)
Creatinine, Ser: 0.99 mg/dL (ref 0.40–1.20)
GFR: 57.59 mL/min — ABNORMAL LOW (ref >60.00)
Glucose, Bld: 74 mg/dL (ref 70–99)
Phosphorus: 3.2 mg/dL (ref 2.3–4.6)
Potassium: 4.2 meq/L (ref 3.5–5.1)
Sodium: 141 meq/L (ref 135–145)

## 2023-09-03 ENCOUNTER — Ambulatory Visit: Admitting: Physical Therapy

## 2023-09-03 DIAGNOSIS — M25561 Pain in right knee: Secondary | ICD-10-CM

## 2023-09-03 DIAGNOSIS — R262 Difficulty in walking, not elsewhere classified: Secondary | ICD-10-CM

## 2023-09-03 NOTE — Therapy (Addendum)
 OUTPATIENT PHYSICAL THERAPY PROGRESS NOTE    Patient Name: Terri Melendez MRN: [id number] DOB:[birth year], 73 y.o., female Today's Date: 09/03/2023  END OF SESSION:  PT End of Session - 09/03/23 1432     Visit Number 10    Number of Visits 16    Date for PT Re-Evaluation 10/24/23    Authorization Type [employer] 2025    Authorization Time Period 08/01/23-10/24/23    Authorization - Visit Number 10    Authorization - Number of Visits 20    Progress Note Due on Visit 10    PT Start Time 1430    PT Stop Time 1515    PT Time Calculation (min) 45 min    Activity Tolerance Patient tolerated treatment well;No increased pain    Behavior During Therapy  for tasks assessed/performed            Past Medical History:  Diagnosis Date   Actinic keratosis    Cancer (HCC)    basal cell on face in 2000 or 2001   Hx of basal cell carcinoma 2002   right cheek    Hx of basal cell carcinoma 02/11/2020   glabella, Mohs with Dr. 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   Anterior knee pain, right 07/30/2023   Trochanteric bursitis of left hip 04/30/2023   Plantar fasciitis of left foot 03/28/2022   Anemia 01/10/2022   Allergic rhinitis 06/17/2020   GERD with esophagitis 06/17/2020   Personal history of other malignant neoplasm of skin 06/03/2020   Polyarthralgia 12/15/2019   Thoracic aortic atherosclerosis (HCC) 12/15/2019   Coronary atherosclerosis due to calcified coronary lesion (CODE) 12/15/2019   Encounter for preventive health examination 12/13/2018   History of shingles 05/04/2018   Non-functional thyroid  nodule 05/04/2018   Essential hypertension 10/22/2017   Advanced care planning/counseling discussion 10/22/2017    Hypercholesteremia 05/14/2016   OSA on CPAP 10/13/2014    PCP: Marylynn Verneita CROME, MD  REFERRING PROVIDER: Marylynn Verneita CROME, MD REFERRING DIAG: Right knee pain  THERAPY DIAG:  Acute pain of right knee  Difficulty in walking, not elsewhere classified  Rationale for Evaluation and Treatment: Rehabilitation ONSET DATE: June 15, 2023  SUBJECTIVE:  SUBJECTIVE STATEMENT: Pt reports ongoing improvement in right knee with a decrease in her knee pain. She still experiences increased knee pain when using stairs and she has not started walking over hills yet.   PERTINENT HISTORY: 72yoF referred to OPPT for continued knee pain and difficulty walking following a twisting injury sustains 06/15/23 wth resultant severe pain, difficulty walking, [facility], unremarkable imaging, issued a knee brace, then returned weeks later and received joint injection. Pt denies any clicking, locking, buckling, instability. Pt denies stiffness as a primary symptom. Pain has progressively improved, but remains painful daily, functionally restrictive to stairs performance and prior AMB distances. In the most recent two weeks to evaluation here on 08/01/23 pt has managed her pain with icing at night, intermittent tylenol, intermittent meloxicam  (prescribed for a different issue), and most recently started a 5 day oral prednisone  course on 07/31/23. Pt recently DC from this clinic for hip pain problem, seen last year here for plantar fasciitis issue.  At evlauation patient reports 3/10 current pain , 5/10 worst pain (in past 7 days), and sometimes is pain free at rest. She stopped using the hinged neoprene knee brace daily as of end of May, but uses it ad lib, as well as ad lib compression sleeve.  PAIN:  Are you having pain? 0.5 /10 NRPS Medial joint line of right knee    PRECAUTIONS: None WEIGHT BEARING RESTRICTIONS: no FALLS:  Has patient fallen in last 6 months? no  PATIENT GOALS: return to baseline  tolerance of community distance AMB  NEXT MD VISIT: Will FU with PCP after 1 month of OPPT for this issue.   OBJECTIVE:  CIRCUMFERENCIAL EDEMA ASSESSMENT (6/10):   Left knee (center patella): 38.5cm  Right knee (center patella): 39.5cm                                                                                                                             TREATMENT DATE   09/03/23: Recumbent Bicycle with seat at 7 for 7 min and resistance at 3   LEFS: 65% or 52/80  : 1,500 ft   Knee Ext R 5   Hip Flex R  5     SELF CARE HOME MANAGEMENT   Discussion of plan of care including what functional deficits that patient still has concerns about: -Stairs and walking over varied terrain and for prolonged about of time   Modification to home exercise plan and introduction of home walking program    08/29/23 TM at 1.5 mph with BUE support Seated HS Stretch on RLE with heel on 6 inch step and strap to pull toes back 3 x 60 sec   Banded Side Step with red band 11 ft 2 x 10  Supine Hip Adduction PROM R/L 30/30  -More restriction on RLE  Supine Butterfly Hip Adduction Stretch 2 x 60 sec  Standing Hip Adduction Stretch for RLE  3  x 60 sec   Reviewed home exercise plan and modified accordingly.    PATIENT EDUCATION:  Education details: maintain walking time/distance until symptoms are more stable, predictable. Person educated: patient  Education method: collaborative learning, deliberate practice, positive reinforcement, explicit instruction, establish rules. Education comprehension: good   HOME EXERCISE PROGRAM: Access Code: [email or web address] URL: [email or web address] Date: 09/03/2023 Prepared by: Toribio Servant  Exercises - Seated Hamstring Stretch with Strap  - 1 x daily - 7 x weekly - 3 reps - 60 sec  hold - Lateral Lunge Adductor Stretch with Counter Support  - 1 x daily - 7 x weekly - 3 reps - 30-60 sec  hold - Standing Gastroc Stretch on Step  - 1 x daily - 7 x weekly -  3 reps - 30-60 sec hold - Squat with Chair Touch and Resistance Loop  - 3-4 x weekly - 3 sets - 10 reps - Standing Heel Raise  - 3-4 x weekly - 3 sets - 10 reps  ASSESSMENT:  CLINICAL IMPRESSION:  Pt continues to progress towards goals with improvement in self-perception of right knee and right knee strength as well as decrease in right knee pain while walking longer distances. Despite these improvements, pt still has concerns with right knee pain restricting her from continuing her walking program and from negotiating stairs at her home. PT to continue with plan of care with focus on functional tasks of walking and stairs and improved right knee pain while performing these activities. PT to decrease frequency given pt's overall improvement. She will continue to benefit from OPPT skilled intervention to improve pain and restore tolerance and participation in activities of IADL and leisure.   OBJECTIVE IMPAIRMENTS: Abnormal gait, decreased activity tolerance, decreased balance, decreased endurance, decreased mobility, difficulty walking, decreased ROM, decreased strength, and increased edema.  ACTIVITY LIMITATIONS: carrying, bending, standing, squatting, sleeping, stairs, and transfers PARTICIPATION LIMITATIONS: meal prep, cleaning, driving, shopping, community activity, and yard work PERSONAL FACTORS: Age, Fitness, Past/current experiences, and Time since onset of injury/illness/exacerbation are also affecting patient's functional outcome.  REHAB POTENTIAL: Excellent CLINICAL DECISION MAKING: Evolving/moderate complexity EVALUATION COMPLEXITY: Moderate  GOALS: Goals reviewed with patient? Yes  SHORT TERM GOALS: Target date: 08/31/23 Pt to demonstrate >1552ft without increase in knee pain >1 NPRS, without antalgia.  Baseline: 1,400 ft 09/03/23: 1,500 ft with MRPS - Goal status: ACHIEVE    2.  MMT testing all 5/5 and pain free compared to evaluation pain.  Baseline: pain with Rt knee  extension and Rt hip flexion. 09/03/23: Knee Ext R 5, Hip Flex 5   Goal status: ACHIEVED      3.  LEFS score improvement >10%  Baseline:  43.8% 09/03/23: 65%  Goal status: ACHIEVED    4.  5xSTS hands free, standard surface height, symmetrical footing, <12.25sec Baseline: 13.46sec hands free, not pain liimted Goal status: DEFERRED    LONG TERM GOALS: Target date: 10/01/23  Pt to demonstrate >1660ft without increase in knee pain >1 NPRS, without antalgia.  Baseline: 1,400 ft  09/03/23: 1,500 ft    Goal status: PARTIALLY MET    2.  MMT testing all 5/5 and pain free compared to evaluation pain.  Baseline: pain with Rt knee extension and Rt hip flexion.  Goal status: ONGOING   3.  LEFS score improvement >25% compared to initital survey  Baseline:  43.8% 09/03/23: 65%  Goal status: ONGOING    4.  5xSTS hands free, standard surface height, symmetrical footing, <10sec Baseline: 13.46sec hands free, not pain liimted Goal status: DEFERRED    5. Patient will be able to walk for >=20 min over varied terrain without exceeding right medial knee pain that is >1/10 NRPS as evidence of improved right knee function to re-engage in walking program for improved fitness and health.   Baseline: 2-3/10  Goal status: ONGOING   6. Patient will be able to negotiate steps ascending and descending without experiencing right knee pain that exceeds 2/10 NRPS to be able to safely reach upstairs portion of her home.  Baseline: 2-3/10  Goal status: ONGOING    PLAN: PT FREQUENCY: 1-2x/week PT DURATION: 12 weeks PLANNED INTERVENTIONS: 97750- Physical Performance Testing, 97110-Therapeutic exercises, 97530- Therapeutic activity, [id number]- Neuromuscular re-education, 97535- Self Care, 02859- Manual therapy, [phone]- Gait training, [phone]- Electrical stimulation (unattended), [phone]- Electrical stimulation (manual), Patient/Family education, Balance training, Stair training, Joint mobilization, Joint manipulation, DME  instructions, Cryotherapy, and Moist heat  PLAN FOR NEXT SESSION:  TM warm up. Step up next time and gait analysis.  Revisit calf raises.  SLR with hip external rotation.   Toribio Servant PT, DPT  [facility] [address], [place], [address] Phone: [phone]   Fax:  [phone]

## 2023-09-05 ENCOUNTER — Ambulatory Visit: Payer: Self-pay | Admitting: Internal Medicine

## 2023-09-05 ENCOUNTER — Ambulatory Visit: Admitting: Physical Therapy

## 2023-09-06 ENCOUNTER — Other Ambulatory Visit: Payer: Self-pay | Admitting: Internal Medicine

## 2023-09-06 DIAGNOSIS — N1831 Chronic kidney disease, stage 3a: Secondary | ICD-10-CM

## 2023-09-09 ENCOUNTER — Encounter: Payer: Self-pay | Admitting: Physical Therapy

## 2023-09-09 ENCOUNTER — Ambulatory Visit: Admitting: Physical Therapy

## 2023-09-09 DIAGNOSIS — R262 Difficulty in walking, not elsewhere classified: Secondary | ICD-10-CM

## 2023-09-09 DIAGNOSIS — M25561 Pain in right knee: Secondary | ICD-10-CM

## 2023-09-09 DIAGNOSIS — M25552 Pain in left hip: Secondary | ICD-10-CM

## 2023-09-09 NOTE — Therapy (Addendum)
 OUTPATIENT PHYSICAL THERAPY PROGRESS NOTE    Patient Name: Terri Melendez MRN: [id number] DOB:[birth year], 73 y.o., female Today's Date: 09/09/2023  END OF SESSION:  PT End of Session - 09/09/23 0950     Visit Number 11    Number of Visits 16    Date for PT Re-Evaluation 10/24/23    Authorization Type [employer] 2025    Authorization Time Period 08/01/23-10/24/23    Authorization - Visit Number 11    Authorization - Number of Visits 20    Progress Note Due on Visit 20    PT Start Time 0947    PT Stop Time 1030    PT Time Calculation (min) 43 min    Activity Tolerance Patient tolerated treatment well;No increased pain    Behavior During Therapy [facility] for tasks assessed/performed            Past Medical History:  Diagnosis Date   Actinic keratosis    Cancer (HCC)    basal cell on face in 2000 or 2001   Hx of basal cell carcinoma 2002   right cheek    Hx of basal cell carcinoma 02/11/2020   glabella, Mohs with Dr. 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   Anterior knee pain, right 07/30/2023   Trochanteric bursitis of left hip 04/30/2023   Plantar fasciitis of left foot 03/28/2022   Anemia 01/10/2022   Allergic rhinitis 06/17/2020   GERD with esophagitis 06/17/2020   Personal history of other malignant neoplasm of skin 06/03/2020   Polyarthralgia 12/15/2019   Thoracic aortic atherosclerosis (HCC) 12/15/2019   Coronary atherosclerosis due to calcified coronary lesion (CODE) 12/15/2019   Encounter for preventive health examination 12/13/2018   History of shingles 05/04/2018   Non-functional thyroid  nodule 05/04/2018   Essential hypertension 10/22/2017   Advanced care planning/counseling discussion 10/22/2017    Hypercholesteremia 05/14/2016   OSA on CPAP 10/13/2014    PCP: Marylynn Verneita CROME, MD  REFERRING PROVIDER: Marylynn Verneita CROME, MD REFERRING DIAG: Right knee pain  THERAPY DIAG:  Acute pain of right knee  Difficulty in walking, not elsewhere classified  Pain in left hip  Rationale for Evaluation and Treatment: Rehabilitation ONSET DATE: June 15, 2023  SUBJECTIVE:  SUBJECTIVE STATEMENT: Pt has to discontinue Meloxicam  due to concern from provider who was concerned about her recent blood work. She describes feeling tightness in the back of her knee since discontinuing her medications. She has continued walking over the weekend following progressive walking schedule and she describes this going ok. She is experiencing most her tightness along the medial gastroc head on medial joint line of knee.    PERTINENT HISTORY: 72yoF referred to OPPT for continued knee pain and difficulty walking following a twisting injury sustains 06/15/23 wth resultant severe pain, difficulty walking, [facility] later, unremarkable imaging, issued a knee brace, then returned weeks later and received joint injection. Pt denies any clicking, locking, buckling, instability. Pt denies stiffness as a primary symptom. Pain has progressively improved, but remains painful daily, functionally restrictive to stairs performance and prior AMB distances. In the most recent two weeks to evaluation here on 08/01/23 pt has managed her pain with icing at  night, intermittent tylenol, intermittent meloxicam  (prescribed for a different issue), and most recently started a 5 day oral prednisone  course on 07/31/23. Pt recently DC from this clinic for hip pain problem, seen last year here for plantar fasciitis issue. At evlauation patient reports 3/10 current pain , 5/10 worst pain (in past 7 days), and sometimes is pain free at rest. She stopped using the hinged neoprene knee brace daily as of end of May, but uses it ad lib, as well as  ad lib compression sleeve.  PAIN:  Are you having pain? 0.5 /10 NRPS Medial joint line of right knee    PRECAUTIONS: None WEIGHT BEARING RESTRICTIONS: no FALLS:  Has patient fallen in last 6 months? no  PATIENT GOALS: return to baseline tolerance of community distance AMB  NEXT MD VISIT: Will FU with PCP after 1 month of OPPT for this issue.   OBJECTIVE:  CIRCUMFERENCIAL EDEMA ASSESSMENT (6/10):   Left knee (center patella): 38.5cm  Right knee (center patella): 39.5cm                                                                                                                             TREATMENT DATE   09/09/23: All performed on RLE   Recumbent Bicycle with seat at 7 for 7 min and resistance at 3   Seated Medial Calf Stretch on 6 inch step 3 x 60 sec    Sartorius Exercise or Seated Hip ER PROM 3 x 10   300 ft overground walking after massage   -Pt reports increased relief of right medial knee tightness   Left Side Lean  with flexed hip and knees stretch 2 x 60 sec  -Pt does not report increased stretch   Standing Medial Calf Stretch with ER of foot on Step 2 x 60 sec    300 ft overground walking after stretches     MANUAL  Trigger point release and massage of right medial knee joint on medial hamstring and medial head of gastroc   Prone Sartorius stretch with hip internal rotation     PATIENT EDUCATION:  Education details: maintain walking time/distance until symptoms are more stable, predictable. Person educated: patient  Education method: collaborative learning, deliberate practice, positive reinforcement, explicit instruction, establish rules. Education comprehension: good   HOME EXERCISE PROGRAM: Access Code: [email or web address] URL: [email or web address] Date: 09/09/2023 Prepared by: Toribio Servant  Exercises - Lateral Lunge Adductor Stretch with Counter Support  - 1 x daily - 7 x weekly - 3 reps - 30-60 sec  hold - Standing Gastroc Stretch on Step  - 1 x  daily - 7 x weekly - 3 reps - 30-60 sec hold - Squat with Chair Touch and Resistance Loop  - 3-4 x weekly - 3 sets - 10 reps - Standing Heel Raise  - 3-4 x weekly - 3 sets - 10 reps - Seated Hip Flexion and External Rotation  - 3-4 x weekly - 3 sets -  10 reps  ASSESSMENT:  CLINICAL IMPRESSION: Despite initial report of increased tightness on right medial knee, pt was able to perform all exercises without being limited. PT modified exercise to avoid stairs today due to pt just weaning off her NSAID medication. She is likely experiencing increased muscular tension in medial joint line of right knee from increased swelling from stopping NSAID. She did experience relief with stretching of musculature surrounding right knee including sartorius, hamstring, and medial gastroc. She will continue to benefit from OPPT skilled intervention to improve pain and restore tolerance and participation in activities of IADL and leisure.  OBJECTIVE IMPAIRMENTS: Abnormal gait, decreased activity tolerance, decreased balance, decreased endurance, decreased mobility, difficulty walking, decreased ROM, decreased strength, and increased edema.  ACTIVITY LIMITATIONS: carrying, bending, standing, squatting, sleeping, stairs, and transfers PARTICIPATION LIMITATIONS: meal prep, cleaning, driving, shopping, community activity, and yard work PERSONAL FACTORS: Age, Fitness, Past/current experiences, and Time since onset of injury/illness/exacerbation are also affecting patient's functional outcome.  REHAB POTENTIAL: Excellent CLINICAL DECISION MAKING: Evolving/moderate complexity EVALUATION COMPLEXITY: Moderate  GOALS: Goals reviewed with patient? Yes  SHORT TERM GOALS: Target date: 08/31/23 Pt to demonstrate >1573ft without increase in knee pain >1 NPRS, without antalgia.  Baseline: 1,400 ft 09/03/23: 1,500 ft with MRPS - Goal status: ACHIEVE    2.  MMT testing all 5/5 and pain free compared to evaluation pain.   Baseline: pain with Rt knee extension and Rt hip flexion. 09/03/23: Knee Ext R 5, Hip Flex 5   Goal status: ACHIEVED      3.  LEFS score improvement >10%  Baseline:  43.8% 09/03/23: 65%  Goal status: ACHIEVED    4.  5xSTS hands free, standard surface height, symmetrical footing, <12.25sec Baseline: 13.46sec hands free, not pain liimted Goal status: DEFERRED    LONG TERM GOALS: Target date: 10/01/23  Pt to demonstrate >1618ft without increase in knee pain >1 NPRS, without antalgia.  Baseline: 1,400 ft  09/03/23: 1,500 ft    Goal status: PARTIALLY MET    2.  MMT testing all 5/5 and pain free compared to evaluation pain.  Baseline: pain with Rt knee extension and Rt hip flexion.  Goal status: ONGOING   3.  LEFS score improvement >25% compared to initital survey  Baseline:  43.8% 09/03/23: 65%  Goal status: ONGOING    4.  5xSTS hands free, standard surface height, symmetrical footing, <10sec Baseline: 13.46sec hands free, not pain liimted Goal status: DEFERRED    5. Patient will be able to walk for >=20 min over varied terrain without exceeding right medial knee pain that is >1/10 NRPS as evidence of improved right knee function to re-engage in walking program for improved fitness and health.   Baseline: 2-3/10  Goal status: ONGOING   6. Patient will be able to negotiate steps ascending and descending without experiencing right knee pain that exceeds 2/10 NRPS to be able to safely reach upstairs portion of her home.  Baseline: 2-3/10  Goal status: ONGOING    PLAN: PT FREQUENCY: 1-2x/week PT DURATION: 12 weeks PLANNED INTERVENTIONS: 97750- Physical Performance Testing, 97110-Therapeutic exercises, 97530- Therapeutic activity, [id number]- Neuromuscular re-education, 97535- Self Care, 02859- Manual therapy, [phone]- Gait training, [phone]- Electrical stimulation (unattended), [phone]- Electrical stimulation (manual), Patient/Family education, Balance training, Stair training, Joint mobilization,  Joint manipulation, DME instructions, Cryotherapy, and Moist heat  PLAN FOR NEXT SESSION:  TM warm up. Step up next time and gait analysis.  Revisit calf raises with medial grastroc head focus. SLR with hip  external rotation. Medial hamstring stretch   Toribio Servant PT, DPT  [facility] [address] [place], [address] Phone: [phone]   Fax:  [phone]

## 2023-09-11 ENCOUNTER — Encounter: Payer: Self-pay | Admitting: Internal Medicine

## 2023-09-11 ENCOUNTER — Ambulatory Visit: Payer: Self-pay

## 2023-09-11 NOTE — Telephone Encounter (Signed)
 Pt is scheduled to see Evelene Croon, NP tomorrow.

## 2023-09-11 NOTE — Progress Notes (Signed)
 Established Patient Office Visit  Subjective:  Patient ID: Terri Melendez, female    DOB: [birth year]  Age: 73 y.o. MRN: [id number]  CC:  Chief Complaint  Patient presents with   Dizziness    Started Tuesday morning every now and then not constant    Discussed the use of a AI scribe software for clinical note transcription with the patient, who gave verbal consent to proceed.  HPI  Terri Melendez is a 73 year old female who presents with dizziness and lightheadedness.  On Tuesday morning, while driving home, she experienced a sudden onset of feeling 'fuzzy' and as though she was going to pass out. She managed to pull over and rest before continuing home. Since then, she has had intermittent dizziness and a general feeling of weakness. The dizziness is described as lightheadedness, occurring even when sitting still, and lasts about 20 to 30 seconds.  She feels weak and not herself, with occasional queasiness in her stomach, though she maintains her appetite and continues to eat normally. She has been drinking enough fluids and has not experienced any visual changes, shortness of breath, chest pain, or swelling.  She recently stopped taking meloxicam  and has made dietary changes, significantly reducing her salt intake. Her current medication includes telmisartan  40 mg/12.5 mg. She reports that her blood pressure has felt lower than usual.  Past Medical History:  Diagnosis Date   Actinic keratosis    Allergy    dust   Cancer (HCC)    basal cell on face in 2000 or 2001   GERD (gastroesophageal reflux disease)    on medication Dr. Herminio   Hx of basal cell carcinoma 2002   right cheek    Hx of basal cell carcinoma 02/11/2020   glabella, Mohs with Dr. Bluford 06/03/2020   Hypertension    Osteopenia    Renal disorder    recent bloodwork (2025) showing GFR under 60   Sinus congestion    Sleep apnea    use CPAP    Past Surgical History:  Procedure Laterality  Date   BASAL CELL CARCINOMA EXCISION     TONSILLECTOMY AND ADENOIDECTOMY      Family History  Problem Relation Age of Onset   Cancer Mother        lung   Breast cancer Maternal Aunt     Social History   Socioeconomic History   Marital status: Married    Spouse name: Not on file   Number of children: Not on file   Years of education: Not on file   Highest education level: Bachelor's degree (e.g., BA, AB, BS)  Occupational History   Not on file  Tobacco Use   Smoking status: Never   Smokeless tobacco: Never  Vaping Use   Vaping status: Never Used  Substance and Sexual Activity   Alcohol use: Yes    Comment: on occasion   Drug use: No   Sexual activity: Not on file  Other Topics Concern   Not on file  Social History Narrative   Married   Social Drivers of Health   Financial Resource Strain: Low Risk  (10/10/2022)   Overall Financial Resource Strain (CARDIA)    Difficulty of Paying Living Expenses: Not hard at all  Food Insecurity: No Food Insecurity (10/10/2022)   Hunger Vital Sign    Worried About Running Out of Food in the Last Year: Never true    Ran Out of Food in the Last Year: Never true  Transportation Needs: No Transportation Needs (10/10/2022)   PRAPARE - Administrator, Civil Service (Medical): No    Lack of Transportation (Non-Medical): No  Physical Activity: Insufficiently Active (10/10/2022)   Exercise Vital Sign    Days of Exercise per Week: 4 days    Minutes of Exercise per Session: 30 min  Stress: No Stress Concern Present (10/10/2022)   [employer] of Occupational Health - Occupational Stress Questionnaire    Feeling of Stress : Not at all  Social Connections: Socially Integrated (10/10/2022)   Social Connection and Isolation Panel    Frequency of Communication with Friends and Family: More than three times a week    Frequency of Social Gatherings with Friends and Family: Twice a week    Attends Religious Services: More than 4  times per year    Active Member of [employer] or Organizations: Yes    Attends Banker Meetings: More than 4 times per year    Marital Status: Married  Catering manager Violence: Not At Risk (10/10/2022)   Humiliation, Afraid, Rape, and Kick questionnaire    Fear of Current or Ex-Partner: No    Emotionally Abused: No    Physically Abused: No    Sexually Abused: No     Outpatient Medications Prior to Visit  Medication Sig Dispense Refill   atorvastatin  (LIPITOR) 20 MG tablet TAKE 1 TABLET BY MOUTH DAILY 90 tablet 3   cetirizine  (ZYRTEC ) 10 MG tablet Take 1 tablet (10 mg total) by mouth daily. 90 tablet 1   cholecalciferol  (VITAMIN D3) 25 MCG (1000 UNIT) tablet Take 1,000 Units by mouth daily.     mometasone  (NASONEX ) 50 MCG/ACT nasal spray Place 2 sprays into the nose daily. 17 g 11   pantoprazole  (PROTONIX ) 20 MG tablet Take 1 tablet (20 mg total) by mouth daily. 90 tablet 1   telmisartan -hydrochlorothiazide  (MICARDIS  HCT) 40-12.5 MG tablet Take 1 tablet by mouth daily. 90 tablet 1   clobetasol  cream (TEMOVATE ) 0.05 % Apply 1 Application topically 2 (two) times daily. Bid to rash/fissures on fingers until clear, then prn flares, avoid face, groin, axilla 30 g 1   No facility-administered medications prior to visit.    Allergies  Allergen Reactions   Dust Mite Extract     ROS Review of Systems  Neurological:  Positive for dizziness.   Negative unless indicated in HPI.    Objective:    Physical Exam Constitutional:      Appearance: Normal appearance.  HENT:     Mouth/Throat:     Mouth: Mucous membranes are moist.  Eyes:     Conjunctiva/sclera: Conjunctivae normal.     Pupils: Pupils are equal, round, and reactive to light.  Cardiovascular:     Rate and Rhythm: Normal rate and regular rhythm.     Pulses: Normal pulses.     Heart sounds: Normal heart sounds.  Pulmonary:     Effort: Pulmonary effort is normal.     Breath sounds: Normal breath sounds.   Abdominal:     General: Bowel sounds are normal.     Palpations: Abdomen is soft.  Musculoskeletal:     Cervical back: Normal range of motion. No tenderness.  Skin:    General: Skin is warm.     Findings: No bruising.  Neurological:     General: No focal deficit present.     Mental Status: She is alert and oriented to person, place, and time. Mental status is at baseline.  Psychiatric:  Mood and Affect: Mood normal.        Behavior: Behavior normal.        Thought Content: Thought content normal.        Judgment: Judgment normal.     BP 104/66   Pulse 90   Temp 98.1 F (36.7 C)   Resp 20   Ht 5' 4 (1.626 m)   Wt 141 lb (64 kg)   SpO2 99%   BMI 24.20 kg/m  Wt Readings from Last 3 Encounters:  09/25/23 142 lb (64.4 kg)  09/19/23 141 lb (64 kg)  09/15/23 141 lb 1.5 oz (64 kg)     Health Maintenance  Topic Date Due   Medicare Annual Wellness (AWV)  10/10/2023   COVID-19 Vaccine (6 - 2024-25 season) 10/04/2023 (Originally 10/28/2022)   INFLUENZA VACCINE  09/27/2023   MAMMOGRAM  03/27/2024   DTaP/Tdap/Td (3 - Td or Tdap) 10/16/2025   Colonoscopy  10/08/2028   Pneumococcal Vaccine: 50+ Years  Completed   DEXA SCAN  Completed   Hepatitis C Screening  Completed   Zoster Vaccines- Shingrix  Completed   Hepatitis B Vaccines  Aged Out   HPV VACCINES  Aged Out   Meningococcal B Vaccine  Aged Out    There are no preventive care reminders to display for this patient.  Lab Results  Component Value Date   TSH 1.03 09/12/2023   Lab Results  Component Value Date   WBC 8.3 09/15/2023   HGB 12.4 09/15/2023   HCT 36.6 09/15/2023   MCV 89.3 09/15/2023   PLT 245 09/15/2023   Lab Results  Component Value Date   NA 139 09/20/2023   K 3.8 09/20/2023   CO2 25 09/20/2023   GLUCOSE 153 (H) 09/20/2023   BUN 21 09/20/2023   CREATININE 0.84 09/20/2023   BILITOT 0.5 09/12/2023   ALKPHOS 102 09/12/2023   AST 18 09/12/2023   ALT 12 09/12/2023   PROT 6.3 09/12/2023    ALBUMIN 4.4 09/12/2023   CALCIUM  8.9 09/20/2023   ANIONGAP 8 09/15/2023   EGFR 74 09/20/2023   GFR 69.14 09/12/2023   Lab Results  Component Value Date   CHOL 156 07/30/2023   Lab Results  Component Value Date   HDL 58.70 07/30/2023   Lab Results  Component Value Date   LDLCALC 83 07/30/2023   Lab Results  Component Value Date   TRIG 70.0 07/30/2023   Lab Results  Component Value Date   CHOLHDL 3 07/30/2023   Lab Results  Component Value Date   HGBA1C 5.8 01/29/2023      Assessment & Plan:  Dizziness Assessment & Plan: Acute dizziness and lightheadedness  with fluctuating BP readings.  Differential includes orthostatic hypotension and dehydration. - Advise maintaining normal salt intake. -EKG with no significant changes compared to the previous. - Order blood tests for electrolytes and thyroid  levels. - Advise slow positional changes. - Recommend increased fluid intake and consumption of almonds, walnuts, and chocolate. - Suggest wearing compression stockings. - Close follow-up.  Orders: -     EKG 12-Lead -     CBC with Differential/Platelet -     Comprehensive metabolic panel with GFR -     TSH  Red flag discussed.  Patient was provided clear instructions to go to ER or urgent care if symptoms does not improve, red flag or new problem develops.  Patient verbalized understanding.   Follow-up: Return in about 1 week (around 09/19/2023) for dizziness.   Shany Marinez,  NP

## 2023-09-11 NOTE — Telephone Encounter (Signed)
 FYI Only or Action Required?: Action required by provider: request for appointment.  Patient was last seen in primary care on 07/30/2023 by Marylynn Verneita CROME, MD.  Called Nurse Triage reporting Dizziness.  Symptoms began yesterday.  Interventions attempted: Rest, hydration, or home remedies.  Symptoms are: unchanged.  Triage Disposition: See Physician Within 24 Hours  Patient/caregiver understands and will follow disposition?: Yes              Copied from CRM [id number]. Topic: Clinical - Red Word Triage >> Sep 11, 2023  9:37 AM Thersia BROCKS wrote: [facility] that prompted transfer to Nurse Triage: Patient called in stated she has been having dizzy spell since yesterday Reason for Disposition  [1] MODERATE dizziness (e.g., interferes with normal activities) AND [2] has NOT been evaluated by doctor (or NP/PA) for this  (Exception: Dizziness caused by heat exposure, sudden standing, or poor fluid intake.)  Answer Assessment - Initial Assessment Questions 1. DESCRIPTION: Describe your dizziness.     lightheaded 2. LIGHTHEADED: Do you feel lightheaded? (e.g., somewhat faint, woozy, weak upon standing)     Somewhat faint 3. VERTIGO: Do you feel like either you or the room is spinning or tilting? (i.e., vertigo)     no 4. SEVERITY: How bad is it?  Do you feel like you are going to faint? Can you stand and walk?     Feels faint 5. ONSET:  When did the dizziness begin?     yesterday 6. AGGRAVATING FACTORS: Does anything make it worse? (e.g., standing, change in head position)     no 7. HEART RATE: Can you tell me your heart rate? How many beats in 15 seconds?  (Note: Not all patients can do this.)       no 8. CAUSE: What do you think is causing the dizziness? (e.g., decreased fluids or food, diarrhea, emotional distress, heat exposure, new medicine, sudden standing, vomiting; unknown)     unknown 9. RECURRENT SYMPTOM: Have you had dizziness before? If Yes, ask:  When was the last time? What happened that time?     no 10. OTHER SYMPTOMS: Do you have any other symptoms? (e.g., fever, chest pain, vomiting, diarrhea, bleeding)       Weight loss,  Protocols used: Dizziness - Lightheadedness-A-AH

## 2023-09-12 ENCOUNTER — Encounter: Admitting: Physical Therapy

## 2023-09-12 ENCOUNTER — Encounter: Payer: Self-pay | Admitting: Nurse Practitioner

## 2023-09-12 ENCOUNTER — Ambulatory Visit: Payer: Self-pay | Admitting: Nurse Practitioner

## 2023-09-12 ENCOUNTER — Ambulatory Visit: Admitting: Nurse Practitioner

## 2023-09-12 VITALS — BP 104/66 | HR 90 | Temp 98.1°F | Resp 20 | Ht 64.0 in | Wt 141.0 lb

## 2023-09-12 DIAGNOSIS — R42 Dizziness and giddiness: Secondary | ICD-10-CM | POA: Diagnosis not present

## 2023-09-12 LAB — CBC WITH DIFFERENTIAL/PLATELET
Basophils Absolute: 0.1 K/uL (ref 0.0–0.1)
Basophils Relative: 0.7 % (ref 0.0–3.0)
Eosinophils Absolute: 0.1 K/uL (ref 0.0–0.7)
Eosinophils Relative: 2 % (ref 0.0–5.0)
HCT: 37.6 % (ref 36.0–46.0)
Hemoglobin: 12.8 g/dL (ref 12.0–15.0)
Lymphocytes Relative: 20.2 % (ref 12.0–46.0)
Lymphs Abs: 1.4 K/uL (ref 0.7–4.0)
MCHC: 34 g/dL (ref 30.0–36.0)
MCV: 89.1 fl (ref 78.0–100.0)
Monocytes Absolute: 0.6 K/uL (ref 0.1–1.0)
Monocytes Relative: 8.7 % (ref 3.0–12.0)
Neutro Abs: 4.6 K/uL (ref 1.4–7.7)
Neutrophils Relative %: 68.4 % (ref 43.0–77.0)
Platelets: 264 K/uL (ref 150.0–400.0)
RBC: 4.22 Mil/uL (ref 3.87–5.11)
RDW: 13.7 % (ref 11.5–15.5)
WBC: 6.7 K/uL (ref 4.0–10.5)

## 2023-09-12 LAB — COMPREHENSIVE METABOLIC PANEL WITH GFR
ALT: 12 U/L (ref 0–35)
AST: 18 U/L (ref 0–37)
Albumin: 4.4 g/dL (ref 3.5–5.2)
Alkaline Phosphatase: 102 U/L (ref 39–117)
BUN: 19 mg/dL (ref 6–23)
CO2: 30 meq/L (ref 19–32)
Calcium: 9.7 mg/dL (ref 8.4–10.5)
Chloride: 102 meq/L (ref 96–112)
Creatinine, Ser: 0.85 mg/dL (ref 0.40–1.20)
GFR: 69.14 mL/min (ref >60.00)
Glucose, Bld: 80 mg/dL (ref 70–99)
Potassium: 4.3 meq/L (ref 3.5–5.1)
Sodium: 139 meq/L (ref 135–145)
Total Bilirubin: 0.5 mg/dL (ref 0.2–1.2)
Total Protein: 6.3 g/dL (ref 6.0–8.3)

## 2023-09-12 LAB — TSH: TSH: 1.03 u[IU]/mL (ref 0.35–5.50)

## 2023-09-12 NOTE — Patient Instructions (Addendum)
-  Consider wearing compression stockings. -Monitor and record your blood pressure at home.

## 2023-09-13 ENCOUNTER — Other Ambulatory Visit: Payer: Self-pay | Admitting: Nephrology

## 2023-09-13 DIAGNOSIS — R8290 Unspecified abnormal findings in urine: Secondary | ICD-10-CM

## 2023-09-13 DIAGNOSIS — N1831 Chronic kidney disease, stage 3a: Secondary | ICD-10-CM

## 2023-09-13 DIAGNOSIS — C44319 Basal cell carcinoma of skin of other parts of face: Secondary | ICD-10-CM

## 2023-09-13 DIAGNOSIS — E785 Hyperlipidemia, unspecified: Secondary | ICD-10-CM

## 2023-09-15 ENCOUNTER — Other Ambulatory Visit: Payer: Self-pay

## 2023-09-15 ENCOUNTER — Emergency Department

## 2023-09-15 ENCOUNTER — Emergency Department
Admission: EM | Admit: 2023-09-15 | Discharge: 2023-09-15 | Disposition: A | Discharge status: Home / Self Care | Attending: Emergency Medicine | Admitting: Emergency Medicine

## 2023-09-15 DIAGNOSIS — I10 Essential (primary) hypertension: Secondary | ICD-10-CM | POA: Diagnosis not present

## 2023-09-15 DIAGNOSIS — R42 Dizziness and giddiness: Secondary | ICD-10-CM | POA: Insufficient documentation

## 2023-09-15 HISTORY — DX: Disorder of kidney and ureter, unspecified: N28.9

## 2023-09-15 LAB — CBC WITH DIFFERENTIAL/PLATELET
Abs Immature Granulocytes: 0.02 K/uL (ref 0.00–0.07)
Basophils Absolute: 0 K/uL (ref 0.0–0.1)
Basophils Relative: 1 %
Eosinophils Absolute: 0.1 K/uL (ref 0.0–0.5)
Eosinophils Relative: 1 %
HCT: 36.6 % (ref 36.0–46.0)
Hemoglobin: 12.4 g/dL (ref 12.0–15.0)
Immature Granulocytes: 0 %
Lymphocytes Relative: 24 %
Lymphs Abs: 2 K/uL (ref 0.7–4.0)
MCH: 30.2 pg (ref 26.0–34.0)
MCHC: 33.9 g/dL (ref 30.0–36.0)
MCV: 89.3 fL (ref 80.0–100.0)
Monocytes Absolute: 0.7 K/uL (ref 0.1–1.0)
Monocytes Relative: 8 %
Neutro Abs: 5.5 K/uL (ref 1.7–7.7)
Neutrophils Relative %: 66 %
Platelets: 245 K/uL (ref 150–400)
RBC: 4.1 MIL/uL (ref 3.87–5.11)
RDW: 12.6 % (ref 11.5–15.5)
WBC: 8.3 K/uL (ref 4.0–10.5)
nRBC: 0 % (ref 0.0–0.2)

## 2023-09-15 LAB — URINALYSIS, COMPLETE (UACMP) WITH MICROSCOPIC
Bacteria, UA: NONE SEEN
Bilirubin Urine: NEGATIVE
Glucose, UA: NEGATIVE mg/dL
Ketones, ur: NEGATIVE mg/dL
Leukocytes,Ua: NEGATIVE
Nitrite: NEGATIVE
Protein, ur: NEGATIVE mg/dL
Specific Gravity, Urine: 1.008 (ref 1.005–1.030)
pH: 6 (ref 5.0–8.0)

## 2023-09-15 LAB — BASIC METABOLIC PANEL WITH GFR
Anion gap: 8 (ref 5–15)
BUN: 22 mg/dL (ref 8–23)
CO2: 24 mmol/L (ref 22–32)
Calcium: 9.6 mg/dL (ref 8.9–10.3)
Chloride: 102 mmol/L (ref 98–111)
Creatinine, Ser: 0.87 mg/dL (ref 0.44–1.00)
GFR, Estimated: >60 mL/min (ref >60)
Glucose, Bld: 109 mg/dL — ABNORMAL HIGH (ref 70–99)
Potassium: 4.2 mmol/L (ref 3.5–5.1)
Sodium: 134 mmol/L — ABNORMAL LOW (ref 135–145)

## 2023-09-15 LAB — RESP PANEL BY RT-PCR (RSV, FLU A&B, COVID)  RVPGX2
Influenza A by PCR: NEGATIVE
Influenza B by PCR: NEGATIVE
Resp Syncytial Virus by PCR: NEGATIVE
SARS Coronavirus 2 by RT PCR: NEGATIVE

## 2023-09-15 LAB — TROPONIN I (HIGH SENSITIVITY): Troponin I (High Sensitivity): 4 ng/L (ref <18)

## 2023-09-15 MED ORDER — ACETAMINOPHEN 500 MG PO TABS
1000.0000 mg | ORAL_TABLET | Freq: Once | ORAL | Status: AC
  Administered 2023-09-15: 1000 mg via ORAL
  Filled 2023-09-15: qty 2

## 2023-09-15 NOTE — ED Triage Notes (Signed)
 Pt to ED for intermittent dizzy spells and HA since Tuesday, saw PCP on Thursday. Not dizzy right now but head feels heavy. Still has posterior HA, denies vision changes. Denies SOB and CP. Walks with steady gait.

## 2023-09-15 NOTE — ED Notes (Signed)
 Pt ambulated to in room toilet with minimal assistance from staff. Pt returned to bed. Bed is in lowest locked position with call bell within reach. Pt tolerated activity well. No other needs expressed at this time.

## 2023-09-15 NOTE — ED Notes (Addendum)
 Pt reports feeling faint. Felt like she was going to pass out Tuesday (7/15). Went to the doctor Tuesday, got blood work and EKG. Week prior, blood work showed GFR was low. Reports blood pressure has been up and down. Reports GFR was checked again Friday (7/18) and it was up. Reports she has cut back on salt. Pt reports drinking a glass and a half of water at night, has been drinking tea and coffee during the day. Denies spending time outside during the day. Denies experiencing these symptoms before.

## 2023-09-15 NOTE — ED Provider Notes (Addendum)
 [facility] Provider Note    Event Date/Time   First MD Initiated Contact with Patient 09/15/23 1400     (approximate)  History   Chief Complaint: Dizziness and Headache  HPI  Terri Melendez is a 73 y.o. female with a past medical history of gastric reflux, hypertension, presents to the emergency department with symptoms of generalized and intermittent weakness.  According to the patient over the last few days she has been experiencing episodes of dizziness which she describes more as lightheadedness like a sensation that she may pass out.  Patient states she is also been experiencing mild headache at times as well.  Denies any head pain but states it feels heavy.  Patient denies any weakness or numbness of any arm leg confusion speech or thought difficulties.  Denies any urinary symptoms cough congestion or fever.  Physical Exam   Triage Vital Signs: ED Triage Vitals  Encounter Vitals Group     BP 09/15/23 1308 (!) 150/87     Girls Systolic BP Percentile --      Girls Diastolic BP Percentile --      Boys Systolic BP Percentile --      Boys Diastolic BP Percentile --      Pulse Rate 09/15/23 1308 100     Resp 09/15/23 1308 20     Temp 09/15/23 1308 98.4 F (36.9 C)     Temp Source 09/15/23 1308 Oral     SpO2 09/15/23 1308 100 %     Weight 09/15/23 1307 141 lb 1.5 oz (64 kg)     Height 09/15/23 1307 5' 4 (1.626 m)     Head Circumference --      Peak Flow --      Pain Score 09/15/23 1305 4     Pain Loc --      Pain Education --      Exclude from Growth Chart --     Most recent vital signs: Vitals:   09/15/23 1308  BP: (!) 150/87  Pulse: 100  Resp: 20  Temp: 98.4 F (36.9 C)  SpO2: 100%    General: Awake, no distress.  CV:  Good peripheral perfusion.  Regular rate and rhythm  Resp:  Normal effort.  Equal breath sounds bilaterally.  Abd:  No distention.  Soft, nontender.  No rebound or guarding.  ED Results / Procedures /  Treatments   RADIOLOGY  I have reviewed and interpreted the CT head images.  No obvious bleed seen on my evaluation. Radiology has read the CT scan as negative for acute abnormality.   MEDICATIONS ORDERED IN ED: Medications  acetaminophen  (TYLENOL ) tablet 1,000 mg (1,000 mg Oral Given 09/15/23 1420)     IMPRESSION / MDM / ASSESSMENT AND PLAN / ED COURSE  I reviewed the triage vital signs and the nursing notes.  Patient's presentation is most consistent with acute presentation with potential threat to life or bodily function.  Patient presents to the emergency department for several days of intermittent dizziness/lightheaded symptoms.  Patient states the symptoms come and go.  She has been monitoring her blood pressure at home and it has been intermittently dropping into the 90s.  Patient has seen her PCP for the same they were concerned that her kidney function had diminished mildly and they took her off of her meloxicam .  Patient's blood pressure in the emergency department is hypertensive 150/87 but not concerning only so.  Given the patient's intermittent dizziness and slight headache at  times we will obtain a CT scan of the head as a precaution.  Will check labs we will obtain a urine sample as well as a respiratory swab as a precaution.  Will IV hydrate and continue to closely monitor while awaiting these results.  Patient's lab work is so far reassuring with a normal white blood cell count normal CBC.  Reassuring chemistry with normal renal function.  Patient's urinalysis shows no concerning findings.  I have added on a troponin and we are awaiting the respiratory panel still.  Patient receiving IV fluids.  CT scan shows no concerning findings.  If the remainder of the patient's workup is normal I believe the patient could be safely discharged home with outpatient follow-up.  Patient agreeable to plan.  Patient's workup is reassuring normal urinalysis, normal respiratory panel and a  negative troponin.  Patient is very reassured by today's workup.  I discussed with patient to discontinue her blood pressure medication if the systolic blood pressures less than 120 in the morning.  Patient is agreeable to this plan.  She will follow-up with her doctor.  FINAL CLINICAL IMPRESSION(S) / ED DIAGNOSES   Dizziness   Note:  This document was prepared using Dragon voice recognition software and may include unintentional dictation errors.   Dorothyann Drivers, MD 09/15/23 1452    Dorothyann Drivers, MD 09/15/23 1520

## 2023-09-15 NOTE — Discharge Instructions (Addendum)
 Your workup in the emergency department continues to be reassuring.  Please check your blood pressure first thing in the morning, if your blood pressure is below 120 on the top number (systolic) please do not take your blood pressure medications that day, record these blood pressure readings and follow-up with your doctor.  Drink plenty of fluids.  Return to the emergency department for any symptom concerning to yourself.

## 2023-09-16 ENCOUNTER — Ambulatory Visit
Admission: RE | Admit: 2023-09-16 | Discharge: 2023-09-16 | Disposition: A | Discharge status: Home / Self Care | Source: Ambulatory Visit | Attending: Nephrology | Admitting: Nephrology

## 2023-09-16 ENCOUNTER — Encounter: Admitting: Physical Therapy

## 2023-09-16 DIAGNOSIS — R8290 Unspecified abnormal findings in urine: Secondary | ICD-10-CM | POA: Insufficient documentation

## 2023-09-16 DIAGNOSIS — N1831 Chronic kidney disease, stage 3a: Secondary | ICD-10-CM | POA: Diagnosis present

## 2023-09-16 DIAGNOSIS — E785 Hyperlipidemia, unspecified: Secondary | ICD-10-CM | POA: Insufficient documentation

## 2023-09-16 DIAGNOSIS — C44319 Basal cell carcinoma of skin of other parts of face: Secondary | ICD-10-CM | POA: Insufficient documentation

## 2023-09-18 ENCOUNTER — Encounter: Admitting: Physical Therapy

## 2023-09-19 ENCOUNTER — Ambulatory Visit: Admitting: Nurse Practitioner

## 2023-09-19 ENCOUNTER — Ambulatory Visit: Attending: Nurse Practitioner

## 2023-09-19 ENCOUNTER — Telehealth: Payer: Self-pay | Admitting: Physical Therapy

## 2023-09-19 ENCOUNTER — Ambulatory Visit (INDEPENDENT_AMBULATORY_CARE_PROVIDER_SITE_OTHER): Admitting: Nurse Practitioner

## 2023-09-19 ENCOUNTER — Encounter: Payer: Self-pay | Admitting: Nurse Practitioner

## 2023-09-19 VITALS — BP 156/86 | HR 83 | Temp 97.9°F | Ht 64.0 in | Wt 141.0 lb

## 2023-09-19 DIAGNOSIS — R002 Palpitations: Secondary | ICD-10-CM

## 2023-09-19 DIAGNOSIS — G8929 Other chronic pain: Secondary | ICD-10-CM | POA: Diagnosis not present

## 2023-09-19 DIAGNOSIS — M25569 Pain in unspecified knee: Secondary | ICD-10-CM

## 2023-09-19 DIAGNOSIS — R42 Dizziness and giddiness: Secondary | ICD-10-CM | POA: Diagnosis not present

## 2023-09-19 NOTE — Progress Notes (Signed)
 Established Patient Office Visit  Subjective:  Patient ID: Terri Melendez, female    DOB: 01-02-1951  Age: 73 y.o. MRN: [id number]  CC:  Chief Complaint  Patient presents with   Medical Management of Chronic Issues    HPI  Terri Melendez presents for 1 week follow up on dizziness. She has  hypertension and presents with fluctuating blood pressure and associated symptoms. She is accompanied by her husband.  She experiences fluctuating blood pressure, with readings from 89/50 to 156/86. Symptoms include dizziness, weakness, and palpitations, especially when blood pressure is low. She frequently monitors her blood pressure and notes worsening symptoms with lower readings. She takes telmisartan  for hypertension.  Dizziness and weakness episodes are notable, with severe occurrences last Tuesday while driving and on Sunday morning.  She was evaluated in the ED on 09/15/2023  her labs, troponin and respiratory panel were normal.  CT head showed no concerning finding.  She experiences intermittent posterior headaches during physical activities, accompanied by a pounding sensation in her head and heart. Dizziness lacks a spinning sensation.  She uses a CPAP machine for sleep apnea. Last night, she awoke with a racing heart and tingling in her left hand and leg, which resolved quickly.  Her knee injury from April, treated with physical therapy since June, initially improved but has worsened recently. A Baker's cyst behind her knee is painful and swollen, with ice providing temporary relief.  HPI   Past Medical History:  Diagnosis Date   Actinic keratosis    Allergy    dust   Cancer (HCC)    basal cell on face in 2000 or 2001   GERD (gastroesophageal reflux disease)    on medication Dr. Herminio   Hx of basal cell carcinoma 2002   right cheek    Hx of basal cell carcinoma 02/11/2020   glabella, Mohs with Dr. Bluford 06/03/2020   Hypertension    Osteopenia    Renal disorder     recent bloodwork (2025) showing GFR under 60   Sinus congestion    Sleep apnea    use CPAP    Past Surgical History:  Procedure Laterality Date   BASAL CELL CARCINOMA EXCISION     TONSILLECTOMY AND ADENOIDECTOMY      Family History  Problem Relation Age of Onset   Cancer Mother        lung   Breast cancer Maternal Aunt     Social History   Socioeconomic History   Marital status: Married    Spouse name: Not on file   Number of children: Not on file   Years of education: Not on file   Highest education level: Bachelor's degree (e.g., BA, AB, BS)  Occupational History   Not on file  Tobacco Use   Smoking status: Never   Smokeless tobacco: Never  Vaping Use   Vaping status: Never Used  Substance and Sexual Activity   Alcohol use: Yes    Comment: on occasion   Drug use: No   Sexual activity: Not on file  Other Topics Concern   Not on file  Social History Narrative   Married   Social Drivers of Health   Financial Resource Strain: Low Risk  (10/10/2022)   Overall Financial Resource Strain (CARDIA)    Difficulty of Paying Living Expenses: Not hard at all  Food Insecurity: No Food Insecurity (10/10/2022)   Hunger Vital Sign    Worried About Running Out of Food in the Last Year: Never  true    Ran Out of Food in the Last Year: Never true  Transportation Needs: No Transportation Needs (10/10/2022)   PRAPARE - Administrator, Civil Service (Medical): No    Lack of Transportation (Non-Medical): No  Physical Activity: Insufficiently Active (10/10/2022)   Exercise Vital Sign    Days of Exercise per Week: 4 days    Minutes of Exercise per Session: 30 min  Stress: No Stress Concern Present (10/10/2022)   [employer] of Occupational Health - Occupational Stress Questionnaire    Feeling of Stress : Not at all  Social Connections: Socially Integrated (10/10/2022)   Social Connection and Isolation Panel    Frequency of Communication with Friends and Family: More  than three times a week    Frequency of Social Gatherings with Friends and Family: Twice a week    Attends Religious Services: More than 4 times per year    Active Member of [employer] or Organizations: Yes    Attends Engineer, structural: More than 4 times per year    Marital Status: Married  Catering manager Violence: Not At Risk (10/10/2022)   Humiliation, Afraid, Rape, and Kick questionnaire    Fear of Current or Ex-Partner: No    Emotionally Abused: No    Physically Abused: No    Sexually Abused: No     Outpatient Medications Prior to Visit  Medication Sig Dispense Refill   atorvastatin  (LIPITOR) 20 MG tablet TAKE 1 TABLET BY MOUTH DAILY 90 tablet 3   cetirizine  (ZYRTEC ) 10 MG tablet Take 1 tablet (10 mg total) by mouth daily. 90 tablet 1   cholecalciferol  (VITAMIN D3) 25 MCG (1000 UNIT) tablet Take 1,000 Units by mouth daily.     mometasone  (NASONEX ) 50 MCG/ACT nasal spray Place 2 sprays into the nose daily. 17 g 11   telmisartan -hydrochlorothiazide  (MICARDIS  HCT) 40-12.5 MG tablet Take 1 tablet by mouth daily. 90 tablet 1   clobetasol  cream (TEMOVATE ) 0.05 % Apply 1 Application topically 2 (two) times daily. Bid to rash/fissures on fingers until clear, then prn flares, avoid face, groin, axilla 30 g 1   pantoprazole  (PROTONIX ) 20 MG tablet Take 1 tablet (20 mg total) by mouth daily. 90 tablet 1   No facility-administered medications prior to visit.    Allergies  Allergen Reactions   Dust Mite Extract     ROS Review of Systems Negative unless indicated in HPI.    Objective:    Physical Exam Constitutional:      Appearance: Normal appearance.  Cardiovascular:     Rate and Rhythm: Normal rate and regular rhythm.     Pulses: Normal pulses.     Heart sounds: Normal heart sounds.  Pulmonary:     Effort: Pulmonary effort is normal.     Breath sounds: No stridor. No wheezing.  Musculoskeletal:     Cervical back: Normal range of motion.  Neurological:      General: No focal deficit present.     Mental Status: She is alert. Mental status is at baseline.  Psychiatric:        Mood and Affect: Mood normal.        Behavior: Behavior normal.        Thought Content: Thought content normal.        Judgment: Judgment normal.     BP (!) 156/86   Pulse 83   Temp 97.9 F (36.6 C)   Ht 5' 4 (1.626 m)   Wt 141 lb (64  kg)   SpO2 99%   BMI 24.20 kg/m  Wt Readings from Last 3 Encounters:  09/19/23 141 lb (64 kg)  09/15/23 141 lb 1.5 oz (64 kg)  09/12/23 141 lb (64 kg)     Health Maintenance  Topic Date Due   Medicare Annual Wellness (AWV)  10/10/2023   COVID-19 Vaccine (6 - 2024-25 season) 10/04/2023 (Originally 10/28/2022)   INFLUENZA VACCINE  09/27/2023   MAMMOGRAM  03/27/2024   DTaP/Tdap/Td (3 - Td or Tdap) 10/16/2025   Colonoscopy  10/08/2028   Pneumococcal Vaccine: 50+ Years  Completed   DEXA SCAN  Completed   Hepatitis C Screening  Completed   Zoster Vaccines- Shingrix  Completed   Hepatitis B Vaccines  Aged Out   HPV VACCINES  Aged Out   Meningococcal B Vaccine  Aged Out    There are no preventive care reminders to display for this patient.  Lab Results  Component Value Date   TSH 1.03 09/12/2023   Lab Results  Component Value Date   WBC 8.3 09/15/2023   HGB 12.4 09/15/2023   HCT 36.6 09/15/2023   MCV 89.3 09/15/2023   PLT 245 09/15/2023   Lab Results  Component Value Date   NA 139 09/20/2023   K 3.8 09/20/2023   CO2 25 09/20/2023   GLUCOSE 153 (H) 09/20/2023   BUN 21 09/20/2023   CREATININE 0.84 09/20/2023   BILITOT 0.5 09/12/2023   ALKPHOS 102 09/12/2023   AST 18 09/12/2023   ALT 12 09/12/2023   PROT 6.3 09/12/2023   ALBUMIN 4.4 09/12/2023   CALCIUM  8.9 09/20/2023   ANIONGAP 8 09/15/2023   EGFR 74 09/20/2023   GFR 69.14 09/12/2023   Lab Results  Component Value Date   CHOL 156 07/30/2023   Lab Results  Component Value Date   HDL 58.70 07/30/2023   Lab Results  Component Value Date   LDLCALC  83 07/30/2023   Lab Results  Component Value Date   TRIG 70.0 07/30/2023   Lab Results  Component Value Date   CHOLHDL 3 07/30/2023   Lab Results  Component Value Date   HGBA1C 5.8 01/29/2023      Assessment & Plan:  Dizziness Assessment & Plan: Fluctuating blood pressure with episodes of hypotension and hypertension.  Lightheadedness and feeling like she will pass out.  Normal CT and troponin level in the hospital. -Telmisartan  temporarily stopped. Emphasized regular monitoring and medication adjustment.  -Cardiology referral and heart monitor ordered. -Close follow-up, ED precautions discussed.  Orders: -     LONG TERM MONITOR (3-14 DAYS); Future -     Cortisol-am, blood; Future -     Basic metabolic panel with GFR; Future -     Ambulatory referral to Cardiology  Palpitation Assessment & Plan: Intermittent episode of palpitation. -ZIO monitor ordered.  Orders: -     Ambulatory referral to Cardiology  Chronic knee pain, unspecified laterality Assessment & Plan: Knee pain with Baker's cyst, initial improvement with physical therapy, recent worsening. Compression socks in use. Referral to orthopedic specialist planned. - Refer to orthopedic specialist, preference for Dr. Arthea Sheer or Dr. Edie. - Continue using ice therapy for symptomatic relief.   Orders: -     Ambulatory referral to Orthopedic Surgery    Follow-up: Return in about 1 week (around 09/26/2023).   Carrie Usery, NP

## 2023-09-19 NOTE — Telephone Encounter (Signed)
 Discussed pt's ongoing cardiac concern with fluctuating blood pressure and increasing right sided knee pain now that she has discontinued Meloxicam . PT advised pt to discontinue squats and to always have chair or raised surface nearby in case she had syncopal episode.

## 2023-09-20 ENCOUNTER — Other Ambulatory Visit

## 2023-09-20 ENCOUNTER — Other Ambulatory Visit (INDEPENDENT_AMBULATORY_CARE_PROVIDER_SITE_OTHER)

## 2023-09-20 DIAGNOSIS — R42 Dizziness and giddiness: Secondary | ICD-10-CM

## 2023-09-21 ENCOUNTER — Ambulatory Visit: Payer: Self-pay | Admitting: Nurse Practitioner

## 2023-09-21 DIAGNOSIS — G8929 Other chronic pain: Secondary | ICD-10-CM | POA: Insufficient documentation

## 2023-09-21 DIAGNOSIS — R42 Dizziness and giddiness: Secondary | ICD-10-CM | POA: Insufficient documentation

## 2023-09-21 DIAGNOSIS — R002 Palpitations: Secondary | ICD-10-CM | POA: Insufficient documentation

## 2023-09-21 LAB — BASIC METABOLIC PANEL WITH GFR
BUN: 21 mg/dL (ref 7–25)
CO2: 25 mmol/L (ref 20–32)
Calcium: 8.9 mg/dL (ref 8.6–10.4)
Chloride: 106 mmol/L (ref 98–110)
Creat: 0.84 mg/dL (ref 0.60–1.00)
Glucose, Bld: 153 mg/dL — ABNORMAL HIGH (ref 65–99)
Potassium: 3.8 mmol/L (ref 3.5–5.3)
Sodium: 139 mmol/L (ref 135–146)
eGFR: 74 mL/min/1.73m2 (ref >=60)

## 2023-09-21 LAB — CORTISOL-AM, BLOOD: Cortisol - AM: 19.6 ug/dL

## 2023-09-21 NOTE — Assessment & Plan Note (Signed)
 Intermittent episode of palpitation. -ZIO monitor ordered.

## 2023-09-21 NOTE — Assessment & Plan Note (Addendum)
 Fluctuating blood pressure with episodes of hypotension and hypertension.  Lightheadedness and feeling like she will pass out.  Normal CT and troponin level in the hospital. -Telmisartan  temporarily stopped. Emphasized regular monitoring and medication adjustment.  -Cardiology referral and heart monitor ordered. -Close follow-up, ED precautions discussed.

## 2023-09-21 NOTE — Assessment & Plan Note (Signed)
 Knee pain with Baker's cyst, initial improvement with physical therapy, recent worsening. Compression socks in use. Referral to orthopedic specialist planned. - Refer to orthopedic specialist, preference for Dr. Arthea Sheer or Dr. Edie. - Continue using ice therapy for symptomatic relief.

## 2023-09-23 ENCOUNTER — Ambulatory Visit: Admitting: Physical Therapy

## 2023-09-25 ENCOUNTER — Encounter: Payer: Self-pay | Admitting: Cardiovascular Disease

## 2023-09-25 ENCOUNTER — Ambulatory Visit: Attending: Cardiovascular Disease | Admitting: Cardiovascular Disease

## 2023-09-25 ENCOUNTER — Ambulatory Visit: Admitting: Physical Therapy

## 2023-09-25 VITALS — BP 136/74 | HR 77 | Ht 64.0 in | Wt 142.0 lb

## 2023-09-25 DIAGNOSIS — R0602 Shortness of breath: Secondary | ICD-10-CM

## 2023-09-25 DIAGNOSIS — I10 Essential (primary) hypertension: Secondary | ICD-10-CM | POA: Diagnosis not present

## 2023-09-25 DIAGNOSIS — R42 Dizziness and giddiness: Secondary | ICD-10-CM

## 2023-09-25 DIAGNOSIS — R002 Palpitations: Secondary | ICD-10-CM | POA: Diagnosis not present

## 2023-09-25 NOTE — Progress Notes (Signed)
 Cardiology Office Note   Date:  09/25/2023   ID:  Dalaney, Needle 1950-08-14, MRN [id number]  PCP:  Marylynn Verneita CROME, MD  Cardiologist:   Deatrice Cage, MD   Chief Complaint  Patient presents with   New Patient (Initial Visit)    Patient c/o dizziness at times & palpitations with racing heart beats.       History of Present Illness: Terri Melendez is a 72 y.o. female who was referred by Chelsea Aurora for evaluation of dizziness and palpitations. She was seen by Dr. Florencio in 2019 for atypical chest pain.  She had a stress test and echocardiogram that were unremarkable. She has known history of essential hypertension and has been on telmisartan  with hydrochlorothiazide  for few years.  In addition, she has hyperlipidemia and GERD.  In July 17 she was driving her car after she walked outside in a hot day.  She felt dizzy and lightheaded with dizziness and had to stop and rest for a few minutes.  She had another episode of dizziness and palpitations the next day and she measured her blood pressure and it was 104/66.  She was having intermittently low blood pressure readings.  She started having headaches.  On July 28 she felt very weak and measured her blood pressure and it was 97/74.  She went to the ED for evaluation.  CT head was unremarkable and was discharged from the ED after unremarkable basic workup.  She was seen by her primary care provider on July 24 and at that time she was told to stop telmisartan  and hydrochlorothiazide . She started wearing a 2-week ZIO monitor and had intermittent episodes of palpitations while wearing it.  Her symptoms improved after antihypertensive medications were discontinued.  She reports recent shortness of breath and fatigue but no chest pain.  She is not a smoker and rarely drinks  alcohol.    Past Medical History:  Diagnosis Date   Actinic keratosis    Allergy    dust   Cancer (HCC)    basal cell on face in 2000 or 2001    GERD (gastroesophageal reflux disease)    on medication Dr. Herminio   Hx of basal cell carcinoma 2002   right cheek    Hx of basal cell carcinoma 02/11/2020   glabella, Mohs with Dr. Bluford 06/03/2020   Hypertension    Osteopenia    Renal disorder    recent bloodwork (2025) showing GFR under 60   Sinus congestion    Sleep apnea    use CPAP    Past Surgical History:  Procedure Laterality Date   BASAL CELL CARCINOMA EXCISION     TONSILLECTOMY AND ADENOIDECTOMY       Current Outpatient Medications  Medication Sig Dispense Refill   atorvastatin  (LIPITOR) 20 MG tablet TAKE 1 TABLET BY MOUTH DAILY 90 tablet 3   cetirizine  (ZYRTEC ) 10 MG tablet Take 1 tablet (10 mg total) by mouth daily. 90 tablet 1   cholecalciferol  (VITAMIN D3) 25 MCG (1000 UNIT) tablet Take 1,000 Units by mouth daily.     mometasone  (NASONEX ) 50 MCG/ACT nasal spray Place 2 sprays into the nose daily. 17 g 11   No current facility-administered medications for this visit.    Allergies:   Dust mite extract    Social History:  The patient  reports that she has never smoked. She has never used smokeless tobacco. She reports current alcohol use. She reports that she does not use drugs.  Family History:  The patient's family history includes Breast cancer in her maternal aunt; Cancer in her mother.    ROS:  Please see the history of present illness.   Otherwise, review of systems are positive for none.   All other systems are reviewed and negative.    PHYSICAL EXAM: VS:  BP 136/74 (BP Location: Right Arm, Patient Position: Sitting, Cuff Size: Normal)   Pulse 77   Ht 5' 4 (1.626 m)   Wt 142 lb (64.4 kg)   SpO2 97%   BMI 24.37 kg/m  , BMI Body mass index is 24.37 kg/m. GEN: Well nourished, well developed, in no acute distress  HEENT: normal  Neck: no JVD, carotid bruits, or masses Cardiac: RRR; no murmurs, rubs, or gallops,no edema  Respiratory:  clear to auscultation bilaterally, normal work of  breathing GI: soft, nontender, nondistended, + BS MS: no deformity or atrophy  Skin: warm and dry, no rash Neuro:  Strength and sensation are intact Psych: euthymic mood, full affect   EKG:  EKG is ordered today. The ekg ordered today demonstrates : Normal sinus rhythm Normal ECG When compared with ECG of 09-May-2016 18:40, No significant change was found    Recent Labs: 09/12/2023: ALT 12; TSH 1.03 09/15/2023: Hemoglobin 12.4; Platelets 245 09/20/2023: BUN 21; Creat 0.84; Potassium 3.8; Sodium 139    Lipid Panel    Component Value Date/Time   CHOL 156 07/30/2023 1119   CHOL 202 (H) 10/22/2017 1002   CHOL 216 (H) 05/14/2016 1416   TRIG 70.0 07/30/2023 1119   TRIG 200 (H) 05/14/2016 1416   HDL 58.70 07/30/2023 1119   HDL 57 10/22/2017 1002   CHOLHDL 3 07/30/2023 1119   VLDL 14.0 07/30/2023 1119   VLDL 40 (H) 05/14/2016 1416   LDLCALC 83 07/30/2023 1119   LDLCALC 128 (H) 10/22/2017 1002   LDLDIRECT 82.0 07/30/2023 1119      Wt Readings from Last 3 Encounters:  09/25/23 142 lb (64.4 kg)  09/19/23 141 lb (64 kg)  09/15/23 141 lb 1.5 oz (64 kg)          09/24/2023    5:28 PM  PAD Screen  Previous PAD dx? No  Previous surgical procedure? No  Pain with walking? Yes  Subsides with rest? No  Feet/toe relief with dangling? No  Painful, non-healing ulcers? No  Extremities discolored? No      ASSESSMENT AND PLAN:  1.  Dizziness and palpitations: I suspect that some of her symptoms were related to intermittent hypotension.  Most of her symptoms improved after stopping the olmesartan-hydrochlorothiazide .  She continues to have intermittent palpitations and she is currently wearing a ZIO monitor which will be reviewed once results are available.  2.  Fatigue and shortness of breath: This is also recent with no chest pain.  I requested an echocardiogram for evaluation.  Low suspicion for ischemic heart disease.  3.  Essential hypertension: Her blood pressure has been  mostly in the normal range since he stopped taking telmisartan  hydrochlorothiazide .  If blood pressure increases, recommend resuming an ARB without a thiazide diuretic.    Disposition:   FU in 2 months.  Signed,  Deatrice Cage, MD  09/25/2023 4:48 PM    [facility]

## 2023-09-25 NOTE — Assessment & Plan Note (Addendum)
 Acute dizziness and lightheadedness  with fluctuating BP readings.  Differential includes orthostatic hypotension and dehydration. - Advise maintaining normal salt intake. -EKG with no significant changes compared to the previous. - Order blood tests for electrolytes and thyroid  levels. - Advise slow positional changes. - Recommend increased fluid intake and consumption of almonds, walnuts, and chocolate. - Suggest wearing compression stockings. - Close follow-up.

## 2023-09-25 NOTE — Patient Instructions (Signed)
 Medication Instructions:  Your physician recommends that you continue on your current medications as directed. Please refer to the Current Medication list given to you today.   *If you need a refill on your cardiac medications before your next appointment, please call your pharmacy*  Lab Work: None ordered at this time  If you have labs (blood work) drawn today and your tests are completely normal, you will receive your results only by: MyChart Message (if you have MyChart) OR A paper copy in the mail If you have any lab test that is abnormal or we need to change your treatment, we will call you to review the results.  Testing/Procedures: Your physician has requested that you have an Echocardiogram. Echocardiography is a painless test that uses sound waves to create images of your heart. It provides your doctor with information about the size and shape of your heart and how well your heart's chambers and valves are working.   You may receive an ultrasound enhancing agent through an IV if needed to better visualize your heart during the echo. This procedure takes approximately one hour.  There are no restrictions for this procedure.  This will take place at [address] [facility] [address] [facility], [place] [address]  Please note: We ask at that you not bring children with you during ultrasound (echo/ vascular) testing. Due to room size and safety concerns, children are not allowed in the ultrasound rooms during exams. Our front office staff cannot provide observation of children in our lobby area while testing is being conducted. An adult accompanying a patient to their appointment will only be allowed in the ultrasound room at the discretion of the ultrasound technician under special circumstances. We apologize for any inconvenience.   Follow-Up: At [facility], you and your health needs are our priority.  As part of our continuing mission to provide you with exceptional heart  care, our providers are all part of one team.  This team includes your primary Cardiologist (physician) and Advanced Practice Providers or APPs (Physician Assistants and Nurse Practitioners) who all work together to provide you with the care you need, when you need it.  Your next appointment:   2 month(s)  Provider:   You will see one of the following Advanced Practice Providers on your designated Care Team:   Lonni Meager, NP Lesley Maffucci, PA-C Bernardino Bring, PA-C Cadence Asotin, PA-C Tylene Lunch, NP Barnie Hila, NP      We recommend signing up for the patient portal called MyChart.  Sign up information is provided on this After Visit Summary.  MyChart is used to connect with patients for Virtual Visits (Telemedicine).  Patients are able to view lab/test results, encounter notes, upcoming appointments, etc.  Non-urgent messages can be sent to your provider as well.   To learn more about what you can do with MyChart, go to [email or web address].

## 2023-09-26 ENCOUNTER — Ambulatory Visit: Admitting: Physical Therapy

## 2023-09-26 ENCOUNTER — Encounter: Payer: Self-pay | Admitting: Nurse Practitioner

## 2023-09-26 ENCOUNTER — Ambulatory Visit (INDEPENDENT_AMBULATORY_CARE_PROVIDER_SITE_OTHER): Admitting: Nurse Practitioner

## 2023-09-26 VITALS — BP 118/74 | HR 75 | Temp 98.8°F | Ht 64.0 in | Wt 142.4 lb

## 2023-09-26 DIAGNOSIS — R42 Dizziness and giddiness: Secondary | ICD-10-CM | POA: Diagnosis not present

## 2023-09-26 DIAGNOSIS — I10 Essential (primary) hypertension: Secondary | ICD-10-CM

## 2023-09-26 NOTE — Progress Notes (Signed)
 Established Patient Office Visit  Subjective:  Patient ID: Terri Melendez, female    DOB: 10/26/1950  Age: 73 y.o. MRN: [id number]  CC:  Chief Complaint  Patient presents with   Medical Management of Chronic Issues   Discussed the use of a AI scribe software for clinical note transcription with the patient, who gave verbal consent to proceed.  HPI Terri Melendez is a 73 year old female with hypertension who presents for follow-up on blood pressure management and dizziness.  Her blood pressure readings have been stable, with values around 145 mmHg, despite not taking her medication. She noted a higher reading on Sunday, which normalized by Monday. She uses a monitor with an app to track symptoms.  Dizziness has significantly improved and is nearly resolved. Energy levels are better but not fully restored. She drove herself to the appointment today, which she had not done in two weeks.  She has experienced palpitations a few times and has documented these occurrences. She recently visited a cardiologist and is scheduled for an echocardiogram in August. Her blood pressure and fluid retention medications were combined into one pill within the last year, which the cardiologist suspected may have contributed to her symptoms, especially in hot weather.  Cardiologist recommended to resuming ARB without adding thiazide diuretic if blood pressure remains elevated.  HPI   Past Medical History:  Diagnosis Date   Actinic keratosis    Allergy    dust   Cancer (HCC)    basal cell on face in 2000 or 2001   GERD (gastroesophageal reflux disease)    on medication Dr. Herminio   Hx of basal cell carcinoma 2002   right cheek    Hx of basal cell carcinoma 02/11/2020   glabella, Mohs with Dr. Bluford 06/03/2020   Hypertension    Osteopenia    Renal disorder    recent bloodwork (2025) showing GFR under 60   Sinus congestion    Sleep apnea    use CPAP    Past Surgical History:   Procedure Laterality Date   BASAL CELL CARCINOMA EXCISION     TONSILLECTOMY AND ADENOIDECTOMY      Family History  Problem Relation Age of Onset   Cancer Mother        lung   Breast cancer Maternal Aunt     Social History   Socioeconomic History   Marital status: Married    Spouse name: Not on file   Number of children: Not on file   Years of education: Not on file   Highest education level: Bachelor's degree (e.g., BA, AB, BS)  Occupational History   Not on file  Tobacco Use   Smoking status: Never   Smokeless tobacco: Never  Vaping Use   Vaping status: Never Used  Substance and Sexual Activity   Alcohol use: Yes    Comment: on occasion   Drug use: No   Sexual activity: Not on file  Other Topics Concern   Not on file  Social History Narrative   Married   Social Drivers of Health   Financial Resource Strain: Low Risk  (10/10/2022)   Overall Financial Resource Strain (CARDIA)    Difficulty of Paying Living Expenses: Not hard at all  Food Insecurity: No Food Insecurity (10/10/2022)   Hunger Vital Sign    Worried About Running Out of Food in the Last Year: Never true    Ran Out of Food in the Last Year: Never true  Transportation  Needs: No Transportation Needs (10/10/2022)   PRAPARE - Administrator, Civil Service (Medical): No    Lack of Transportation (Non-Medical): No  Physical Activity: Insufficiently Active (10/10/2022)   Exercise Vital Sign    Days of Exercise per Week: 4 days    Minutes of Exercise per Session: 30 min  Stress: No Stress Concern Present (10/10/2022)   [employer] of Occupational Health - Occupational Stress Questionnaire    Feeling of Stress : Not at all  Social Connections: Socially Integrated (10/10/2022)   Social Connection and Isolation Panel    Frequency of Communication with Friends and Family: More than three times a week    Frequency of Social Gatherings with Friends and Family: Twice a week    Attends Religious  Services: More than 4 times per year    Active Member of [employer] or Organizations: Yes    Attends Engineer, structural: More than 4 times per year    Marital Status: Married  Catering manager Violence: Not At Risk (10/10/2022)   Humiliation, Afraid, Rape, and Kick questionnaire    Fear of Current or Ex-Partner: No    Emotionally Abused: No    Physically Abused: No    Sexually Abused: No     Outpatient Medications Prior to Visit  Medication Sig Dispense Refill   atorvastatin  (LIPITOR) 20 MG tablet TAKE 1 TABLET BY MOUTH DAILY 90 tablet 3   cetirizine  (ZYRTEC ) 10 MG tablet Take 1 tablet (10 mg total) by mouth daily. 90 tablet 1   cholecalciferol  (VITAMIN D3) 25 MCG (1000 UNIT) tablet Take 1,000 Units by mouth daily.     mometasone  (NASONEX ) 50 MCG/ACT nasal spray Place 2 sprays into the nose daily. 17 g 11   No facility-administered medications prior to visit.    Allergies  Allergen Reactions   Dust Mite Extract     ROS Review of Systems Negative unless indicated in HPI.    Objective:    Physical Exam  BP 118/74   Pulse 75   Temp 98.8 F (37.1 C)   Ht 5' 4 (1.626 m)   Wt 142 lb 6.4 oz (64.6 kg)   SpO2 99%   BMI 24.44 kg/m  Wt Readings from Last 3 Encounters:  09/26/23 142 lb 6.4 oz (64.6 kg)  09/25/23 142 lb (64.4 kg)  09/19/23 141 lb (64 kg)     Health Maintenance  Topic Date Due   COVID-19 Vaccine (6 - 2024-25 season) 10/28/2022   INFLUENZA VACCINE  09/27/2023   Medicare Annual Wellness (AWV)  10/10/2023   MAMMOGRAM  03/27/2024   DTaP/Tdap/Td (3 - Td or Tdap) 10/16/2025   Colonoscopy  10/08/2028   Pneumococcal Vaccine: 50+ Years  Completed   DEXA SCAN  Completed   Hepatitis C Screening  Completed   Zoster Vaccines- Shingrix  Completed   Hepatitis B Vaccines  Aged Out   HPV VACCINES  Aged Out   Meningococcal B Vaccine  Aged Out    There are no preventive care reminders to display for this patient.  Lab Results  Component Value Date   TSH  1.03 09/12/2023   Lab Results  Component Value Date   WBC 8.3 09/15/2023   HGB 12.4 09/15/2023   HCT 36.6 09/15/2023   MCV 89.3 09/15/2023   PLT 245 09/15/2023   Lab Results  Component Value Date   NA 139 09/20/2023   K 3.8 09/20/2023   CO2 25 09/20/2023   GLUCOSE 153 (H) 09/20/2023  BUN 21 09/20/2023   CREATININE 0.84 09/20/2023   BILITOT 0.5 09/12/2023   ALKPHOS 102 09/12/2023   AST 18 09/12/2023   ALT 12 09/12/2023   PROT 6.3 09/12/2023   ALBUMIN 4.4 09/12/2023   CALCIUM  8.9 09/20/2023   ANIONGAP 8 09/15/2023   EGFR 74 09/20/2023   GFR 69.14 09/12/2023   Lab Results  Component Value Date   CHOL 156 07/30/2023   Lab Results  Component Value Date   HDL 58.70 07/30/2023   Lab Results  Component Value Date   LDLCALC 83 07/30/2023   Lab Results  Component Value Date   TRIG 70.0 07/30/2023   Lab Results  Component Value Date   CHOLHDL 3 07/30/2023   Lab Results  Component Value Date   HGBA1C 5.8 01/29/2023      Assessment & Plan:  Dizziness Assessment & Plan: Previous dizziness resolved. Energy levels suboptimal.  - Continue to monitor blood pressure readings.   Essential hypertension Assessment & Plan: Blood pressure controlled without medication; occasional elevated readings. Previous dizziness resolved. Energy levels suboptimal.  - Monitor blood pressure 1-2 times weekly. - Hold losartan/HCTZ - Close follow-up     Follow-up: Return in about 1 month (around 10/27/2023) for hypertension with PCP.   Kiah Vanalstine, NP

## 2023-09-26 NOTE — Patient Instructions (Signed)
 Continue to monitor the BP 2-3 times a week  and bring the log during the next visit.

## 2023-09-27 ENCOUNTER — Ambulatory Visit: Admitting: Nurse Practitioner

## 2023-09-30 ENCOUNTER — Ambulatory Visit: Attending: Internal Medicine | Admitting: Physical Therapy

## 2023-09-30 DIAGNOSIS — M25561 Pain in right knee: Secondary | ICD-10-CM | POA: Diagnosis present

## 2023-09-30 DIAGNOSIS — R262 Difficulty in walking, not elsewhere classified: Secondary | ICD-10-CM | POA: Diagnosis present

## 2023-09-30 NOTE — Therapy (Signed)
 OUTPATIENT PHYSICAL THERAPY TREATMENT NOTE    Patient Name: Terri Melendez MRN: [id number] DOB:Feb 19, 1951, 73 y.o., female Today's Date: 09/30/2023  END OF SESSION:  PT End of Session - 09/30/23 1036     Visit Number 12    Number of Visits 16    Date for PT Re-Evaluation 10/24/23    Authorization Type [employer] 2025    Authorization Time Period 08/01/23-10/24/23    Authorization - Visit Number 12    Authorization - Number of Visits 20    Progress Note Due on Visit 20    PT Start Time 1032    PT Stop Time 1115    PT Time Calculation (min) 43 min    Activity Tolerance Patient tolerated treatment well;No increased pain    Behavior During Therapy WFL for tasks assessed/performed            Past Medical History:  Diagnosis Date   Actinic keratosis    Allergy    dust   Cancer (HCC)    basal cell on face in 2000 or 2001   GERD (gastroesophageal reflux disease)    on medication Dr. Herminio   Hx of basal cell carcinoma 2002   right cheek    Hx of basal cell carcinoma 02/11/2020   glabella, Mohs with Dr. 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   Anterior knee pain, right 07/30/2023   Trochanteric bursitis of left hip 04/30/2023   Plantar fasciitis of left foot 03/28/2022   Anemia 01/10/2022   Allergic rhinitis 06/17/2020   GERD with esophagitis 06/17/2020   Personal history of other malignant neoplasm of skin 06/03/2020   Polyarthralgia 12/15/2019   Thoracic aortic atherosclerosis (HCC) 12/15/2019   Coronary atherosclerosis due to calcified  coronary lesion (CODE) 12/15/2019   Encounter for preventive health examination 12/13/2018   History of shingles 05/04/2018   Non-functional thyroid  nodule 05/04/2018   Essential hypertension 10/22/2017   Advanced care planning/counseling discussion 10/22/2017   Hypercholesteremia 05/14/2016   OSA on CPAP 10/13/2014    PCP: Marylynn Verneita CROME, MD  REFERRING PROVIDER: Marylynn Verneita CROME, MD REFERRING DIAG: Right knee pain  THERAPY DIAG:  Acute pain of right knee  Difficulty in walking, not elsewhere classified  Rationale for Evaluation and Treatment: Rehabilitation ONSET DATE: June 15, 2023  SUBJECTIVE:  SUBJECTIVE STATEMENT: Pt returning after a prolong hiatus due to changes in blood pressure. She has been feeling increased right knee pain since last session. She has not been walking for the past two weeks because of how tired she has been feeling. She tried to put back on her compression sleeve recently but this was uncomfortable due to the increased swelling in her right knee.    PERTINENT HISTORY: 72yoF referred to OPPT for continued knee pain and difficulty walking following a twisting injury sustains 06/15/23 wth resultant severe pain, difficulty walking, seen at [facility] 2 days later, unremarkable imaging, issued a knee brace, then returned weeks later and received joint injection.  Pt denies any clicking, locking, buckling, instability. Pt denies stiffness as a primary symptom. Pain has progressively improved, but remains painful daily, functionally restrictive to stairs performance and prior AMB distances. In the most recent two weeks to evaluation here on 08/01/23 pt has managed her pain with icing at night, intermittent tylenol , intermittent meloxicam  (prescribed for a different issue), and most recently started a 5 day oral prednisone  course on 07/31/23. Pt recently DC from this clinic for hip pain problem, seen last year here for plantar fasciitis issue. At evlauation patient reports  3/10 current pain , 5/10 worst pain (in past 7 days), and sometimes is pain free at rest. She stopped using the hinged neoprene knee brace daily as of end of May, but uses it ad lib, as well as ad lib compression sleeve.  PAIN:  Are you having pain? 2-3 /10 NRPS Medial joint line of right knee    PRECAUTIONS: None WEIGHT BEARING RESTRICTIONS: no FALLS:  Has patient fallen in last 6 months? no  PATIENT GOALS: return to baseline tolerance of community distance AMB  NEXT MD VISIT: Will FU with PCP after 1 month of OPPT for this issue.   OBJECTIVE:  CIRCUMFERENCIAL EDEMA ASSESSMENT (6/10):   Left knee (center patella): 38.5cm  Right knee (center patella): 39.5cm  CIRCUMFERENCIAL EDEMA ASSESSMENT (8/4):   Left knee (center patella):   35. 6 cm (14 inch)   Right knee (center patella): 38.1 cm (15 inch)                                                                                                                                                                                                  TREATMENT DATE   09/30/23: All performed on RLE   THEREX   Nu-Step with seat and arms at 7 for 5 min    Long Arc Quad AROM with   1 x 10   Long Arc Quad Red Band 2 x 10    Standing Heel Raises 1 x 10   Standing Heel Raises on 4 inch step 2 x 10    CIRCUMFERENCIAL EDEMA ASSESSMENT (8/4):   Left knee (center patella):   35. 6 cm (14 inch)   Right knee (center patella): 38.1 cm (15 inch)   Bridges 1 x 10   Bridges banded hip ER with red band 1 x 10  Bridges banded hip ER with green band 1 x 10   Figure 4 bridges on RLE 3 x 10   Figure 4 bridges on LLE with RLE knee extended and performing SLR 3 x 10 -Pt reports improvement  from feeling strain in lateral side of right hip  LEFS:  41% or  33/80    PATIENT EDUCATION:  Education details: maintain walking time/distance until symptoms are more stable, predictable. Person educated: patient  Education method: collaborative learning, deliberate  practice, positive reinforcement, explicit instruction, establish rules. Education comprehension: good   HOME EXERCISE PROGRAM: Access Code: [email or web address] URL: [email or web address] Date: 09/30/2023 Prepared by: Toribio Servant  Exercises - Lateral Lunge Adductor Stretch with Counter Support  - 1 x daily - 7 x weekly - 3 reps - 30-60 sec  hold - Standing Gastroc Stretch on Step  - 1 x daily - 7 x weekly - 3 reps - 30-60 sec hold - Sitting Knee Extension with Resistance  - 3-4 x weekly - 3 sets - 10 reps - Standing Bilateral Heel Raise on Step  - 3-4 x weekly - 3 sets - 10 reps - Figure 4 Bridge  - 3-4 x weekly - 3 sets - 10 reps - Sidelying Clamshell in Neutral  - 3-4 x weekly - 3 sets - 10 reps  ASSESSMENT:  CLINICAL IMPRESSION: Pt reporting back to physical therapy after delayed hiatus due to uncontrolled blood pressure and increased knee pain. Despite setbacks, pt was able to complete all exercises without an increase in right knee pain and she does not show an increase in swelling since last session. She does exhibit a decrease in her perception of RLE function per her score on LEFS. PT modified home exercise plan to include all exercises in non-weight bearing positions to avoid aggravation of knee pain. She will continue to benefit from OPPT skilled intervention to improve pain and restore tolerance and participation in activities of IADL and leisure.  OBJECTIVE IMPAIRMENTS: Abnormal gait, decreased activity tolerance, decreased balance, decreased endurance, decreased mobility, difficulty walking, decreased ROM, decreased strength, and increased edema.  ACTIVITY LIMITATIONS: carrying, bending, standing, squatting, sleeping, stairs, and transfers PARTICIPATION LIMITATIONS: meal prep, cleaning, driving, shopping, community activity, and yard work PERSONAL FACTORS: Age, Fitness, Past/current experiences, and Time since onset of injury/illness/exacerbation are also affecting  patient's functional outcome.  REHAB POTENTIAL: Excellent CLINICAL DECISION MAKING: Evolving/moderate complexity EVALUATION COMPLEXITY: Moderate  GOALS: Goals reviewed with patient? Yes  SHORT TERM GOALS: Target date: 08/31/23 Pt to demonstrate >1556ft without increase in knee pain >1 NPRS, without antalgia.  Baseline: 1,400 ft 09/03/23: 1,500 ft with MRPS - Goal status: ACHIEVE    2.  MMT testing all 5/5 and pain free compared to evaluation pain.  Baseline: pain with Rt knee extension and Rt hip flexion. 09/03/23: Knee Ext R 5, Hip Flex 5   Goal status: ACHIEVED      3.  LEFS score improvement >10%  Baseline:  43.8% 09/03/23: 65%  Goal status: ACHIEVED    4.  5xSTS hands free, standard surface height, symmetrical footing, <12.25sec Baseline: 13.46sec hands free, not pain liimted Goal status: DEFERRED    LONG TERM GOALS: Target date: 10/01/23  Pt to demonstrate >1648ft without increase in knee pain >1 NPRS, without antalgia.  Baseline: 1,400 ft  09/03/23: 1,500 ft    Goal status: PARTIALLY MET    2.  MMT testing all 5/5 and pain free compared to evaluation pain.  Baseline: pain with Rt knee extension and Rt hip flexion.  Goal status: ONGOING   3.  LEFS score improvement >25% compared to initital survey  Baseline:  43.8% 09/03/23: 65%  09/30/23: 41% Goal status: ONGOING    4.  5xSTS hands free, standard surface  height, symmetrical footing, <10sec Baseline: 13.46sec hands free, not pain liimted Goal status: DEFERRED    5. Patient will be able to walk for >=20 min over varied terrain without exceeding right medial knee pain that is >1/10 NRPS as evidence of improved right knee function to re-engage in walking program for improved fitness and health.   Baseline: 2-3/10  Goal status: ONGOING   6. Patient will be able to negotiate steps ascending and descending without experiencing right knee pain that exceeds 2/10 NRPS to be able to safely reach upstairs portion of her home.   Baseline: 2-3/10  Goal status: ONGOING    PLAN: PT FREQUENCY: 1-2x/week PT DURATION: 12 weeks PLANNED INTERVENTIONS: 97750- Physical Performance Testing, 97110-Therapeutic exercises, 97530- Therapeutic activity, [id number]- Neuromuscular re-education, 97535- Self Care, [id number]- Manual therapy, [phone]- Gait training, [phone]- Electrical stimulation (unattended), [phone]- Electrical stimulation (manual), Patient/Family education, Balance training, Stair training, Joint mobilization, Joint manipulation, DME instructions, Cryotherapy, and Moist heat  PLAN FOR NEXT SESSION:  Recumbent Bicycle warm up. Revisit hip adductor stretch and hs stretch. Wall Squat. Side Lying Hip Abduction. OMEGA HS curl.    Toribio Servant PT, DPT  [facility] [address], [place], [address] Phone: [phone]   Fax:  [phone]

## 2023-10-03 ENCOUNTER — Ambulatory Visit: Admitting: Physical Therapy

## 2023-10-03 NOTE — Progress Notes (Signed)
 Large Joint Injection: R knee  Date/Time: 10/03/2023 9:15 AM  Performed by: Charlene Debby Bruckner, PA Authorized by: Charlene Debby Bruckner, PA   Needle Size:  25 G Location:  Knee Site:  R knee Medications:  3 mL BUPivacaine HCl 0.5 %; 3 mL lidocaine 1 %; 40 mg triamcinolone  acetonide 40 mg/mL

## 2023-10-03 NOTE — Progress Notes (Signed)
 Chief Complaint: Chief Complaint  Patient presents with  . Right Knee - Pain    Terri Melendez is a 73 y.o. female who presents today for evaluation of right knee pain.  She has right knee osteoarthritis.  Had a cortisone shot 3 to 4 months ago and then was wearing a knee brace and undergoing some physical therapy and had some exacerbation of knee pain.  She has had some occasional swelling, pain along the medial joint line.  Pain worse with activity.  Unable take NSAIDs due to kidney function.  Mostly taken Tylenol .  She has not tried gel injections.    Past Medical History: No past medical history on file.  Past Surgical History: Past Surgical History:  Procedure Laterality Date  . COLONOSCOPY  10/09/2018   The entire examined colon is normal/Repeat 57yrs/TKT    Past Family History: History reviewed. No pertinent family history.  Medications: Current Outpatient Medications  Medication Sig Dispense Refill  . atorvastatin  (LIPITOR) 20 MG tablet Take 20 mg by mouth once daily    . hydroCHLOROthiazide  (HYDRODIURIL ) 12.5 MG tablet Take 12.5 mg by mouth once daily    . mometasone  (NASONEX ) 50 mcg/actuation nasal spray by Nasal route.    . omeprazole  (PRILOSEC) 40 MG DR capsule   0  . telmisartan  (MICARDIS ) 40 MG tablet Take 40 mg by mouth once daily     No current facility-administered medications for this visit.    Allergies: Allergies  Allergen Reactions  . Mite Extract Other (See Comments)     Review of Systems:  A comprehensive 14 point ROS was performed, reviewed by me today, and the pertinent orthopaedic findings are documented in the HPI.   Exam: Ht 162.6 cm (5' 4)   Wt 64.9 kg (143 lb)   BMI 24.55 kg/m  General/Constitutional: The patient appears to be well-nourished, well-developed, and in no acute distress. Neuro/Psych: Normal mood and affect, oriented to person, place and time. Eyes: Non-icteric.  Pupils are equal, round, and reactive to light, and  exhibit synchronous movement. ENT: Unremarkable. Lymphatic: No palpable adenopathy. Respiratory: Non-labored breathing Cardiovascular: No edema, swelling or tenderness, except as noted in detailed exam. Integumentary: No impressive skin lesions present, except as noted in detailed exam. Musculoskeletal: Unremarkable, except as noted in detailed exam.  General: Well developed, well nourished 73 y.o. female in no apparent distress.  Normal affect.  Normal communication.  Patient answers questions appropriately.  The patient has a normal gait.  There is no antalgic component.  There is no hip lurch.    Right lower Extremities: Examination of the right lower extremity reveals no bony abnormality, no edema, no effusion and no ecchymosis.  Small nontender Baker's cyst there is no valgus or varus abnormality.  The patient is non-tender along the lateral joint line, and is non-tender along the medial joint line.  The patient has full knee flexion and extension. There is no discomfort with range of motion exercises.  The patient has a negative rotational Mcmurray test.  There is no retropatellar discomfort.  The patient has a negative patella stretch test.  The patient has a negative varus stress test and a negative valgus stress test, in looking for stability.  The patient has a negative Lachman's test.  Vascular: The patient has a negative Toula' test bilaterally.  The patient had a normal dorsalis pedis and posterior tibial pulse.  There is normal skin warmth.  There is normal capillary refill bilaterally.    Neurologic: The patient has a  negative straight leg raise.  The patient has normal muscle strength testing for the quadriceps, calves, ankle dorsiflexion, ankle plantarflexion, and extensor hallicus longus.  The patient has sensation that is intact to light touch.  The deep tendon reflexes are normal at the patella and achilles.  No clonus is noted.     AP lateral sunrise and AP flexion views of  the right knee are ordered interpreted by me in the office.  Impression: AP standing view shows mild joint space narrowing of the medial compartment with mild medial femoral condylar spurring and mild sclerotic changes medial tibial plateau.  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  Mid PA flexion view shows near complete loss of joint space in the medial compartment of the right knee.  No evidence of acute bony abnormality   Impression: Primary osteoarthritis of right knee [M17.11] Primary osteoarthritis of right knee  (primary encounter diagnosis) Right knee pain, unspecified chronicity  Plan:  38.  73 year old female with moderate to severe osteoarthritis left knee near complete loss of joint space medial compartment with mid PA flexion view.  She agreed and consented to left knee intra-articular cortisone injection today in the office will continue with knee sleeve/brace as needed.  Discussed viscosupplementation options in the future as well as possibility needing total knee replacement.  She will continue to progress activity as tolerated and follow-up as needed.   Right Knee Joint Cortisone Injection Procedure: Consent  After discussing the various treatment options for the condition, It was agreed that a knee cortisone injection would be the next step in treatment. The nature of and the indications for a corticosteroid and / or local anaesthetic injection were reviewed in detail with the patient today. The inherent risks of injection including infection, allergic reaction, increased pain, incomplete relief or temporary relief of symptoms, alterations of blood glucose levels requiring careful monitoring and treatment as indicated, tendon, ligament or articular cartilage rupture or degeneration, nerve injury, skin depigmentation, and/or fat atrophy were discussed.  Procedure  After the risks and benefits of the procedure were explained, consent was given, and time-out was performed.  The site for the injection was properly marked and prepped with Chlorhexadine solution.  The anterolateral knee injection site was anesthetized with ethyl chloride.  The right knee was injected with an arthrocentesis procedure, using 3 cc's 0.25% Marcaine and 1 cc of 40 mg of Kenalog .  The procedure was well tolerated.  The patient may proceed with normal function but otherwise rest the joint for a few more days before resuming regular activities. It may be more painful for the first 1-2 days. Watch for fever, or increased swelling or persistent pain in the joint. Call or return to clinic if such symptoms occur or there is failure to improve as anticipated.  Patient was monitored for 20 minutes after injection.  Patient remained alert, oriented, feeling well with no complaints.  She was stable and ready for discharge home.   This note was generated in part with voice recognition software and I apologize for any typographical errors that were not detected and corrected.   Debby Lonni Amber MPA-C

## 2023-10-07 ENCOUNTER — Ambulatory Visit: Admitting: Physical Therapy

## 2023-10-07 NOTE — Assessment & Plan Note (Signed)
 Blood pressure controlled without medication; occasional elevated readings. Previous dizziness resolved. Energy levels suboptimal.  - Monitor blood pressure 1-2 times weekly. - Hold losartan/HCTZ - Close follow-up

## 2023-10-07 NOTE — Assessment & Plan Note (Signed)
 Previous dizziness resolved. Energy levels suboptimal.  - Continue to monitor blood pressure readings.

## 2023-10-09 ENCOUNTER — Ambulatory Visit: Admitting: Physical Therapy

## 2023-10-09 ENCOUNTER — Ambulatory Visit

## 2023-10-09 VITALS — BP 131/64 | Ht 64.0 in | Wt 142.0 lb

## 2023-10-09 DIAGNOSIS — Z0000 Encounter for general adult medical examination without abnormal findings: Secondary | ICD-10-CM

## 2023-10-09 NOTE — Progress Notes (Signed)
 Subjective:   Terri Melendez is a 73 y.o. who presents for a Medicare Wellness preventive visit.  As a reminder, Annual Wellness Visits don't include a physical exam, and some assessments may be limited, especially if this visit is performed virtually. We may recommend an in-person follow-up visit with your provider if needed.  Visit Complete: Virtual I connected with  Terri Melendez [place] on 10/09/23 by a audio enabled telemedicine application and verified that I am speaking with the correct person using two identifiers.  Patient Location: Home  Provider Location: Home Office  I discussed the limitations of evaluation and management by telemedicine. The patient expressed understanding and agreed to proceed.  Vital Signs: Because this visit was a virtual/telehealth visit, some criteria may be missing or patient reported. Any vitals not documented were not able to be obtained and vitals that have been documented are patient reported.  VideoDeclined- This patient declined Librarian, academic. Therefore the visit was completed with audio only.  Persons Participating in Visit: Patient.  AWV Questionnaire: No: Patient Medicare AWV questionnaire was not completed prior to this visit.  Cardiac Risk Factors include: advanced age (>59men, >61 women);dyslipidemia;hypertension     Objective:    Today's Vitals   10/09/23 1544 10/09/23 1546  BP: (!) 143/77 131/64  Weight: 142 lb (64.4 kg)   Height: 5' 4 (1.626 m)    Body mass index is 24.37 kg/m.     10/09/2023    4:06 PM 09/15/2023    1:07 PM 11/28/2022   10:38 AM 10/10/2022    8:35 AM 11/18/2021    9:14 AM 10/06/2021    8:49 AM 10/03/2020   10:37 AM  Advanced Directives  Does Patient Have a Medical Advance Directive? Yes Yes Yes Yes Yes Yes Yes  Type of Estate agent of [place];Living will Healthcare Power of [place];Living will Living will;Healthcare Power of [employer]  Power of [place];Living will Healthcare Power of [place];Living will Healthcare Power of [place];Living will Healthcare Power of [place];Living will  Does patient want to make changes to medical advance directive? No - Patient declined  No - Patient declined No - Patient declined  No - Patient declined No - Patient declined  Copy of Healthcare Power of Attorney in Chart? Yes - validated most recent copy scanned in chart (See row information)   Yes - validated most recent copy scanned in chart (See row information)  Yes - validated most recent copy scanned in chart (See row information) Yes - validated most recent copy scanned in chart (See row information)    Current Medications (verified) Outpatient Encounter Medications as of 10/09/2023  Medication Sig   atorvastatin  (LIPITOR) 20 MG tablet TAKE 1 TABLET BY MOUTH DAILY   cetirizine  (ZYRTEC ) 10 MG tablet Take 1 tablet (10 mg total) by mouth daily.   cholecalciferol  (VITAMIN D3) 25 MCG (1000 UNIT) tablet Take 1,000 Units by mouth daily.   mometasone  (ELOCON ) 0.1 % lotion Apply topically daily as needed.   mometasone  (NASONEX ) 50 MCG/ACT nasal spray Place 2 sprays into the nose daily.   No facility-administered encounter medications on file as of 10/09/2023.    Allergies (verified) Dust mite extract   History: Past Medical History:  Diagnosis Date   Actinic keratosis    Allergy    dust   Cancer (HCC)    basal cell on face in 2000 or 2001   GERD (gastroesophageal reflux disease)    on medication Dr. Herminio   Hx of  basal cell carcinoma 2002   right cheek    Hx of basal cell carcinoma 02/11/2020   glabella, Mohs with Dr. Bluford 06/03/2020   Hypertension    Osteopenia    Renal disorder    recent bloodwork (2025) showing GFR under 60   Sinus congestion    Sleep apnea    use CPAP   Past Surgical History:  Procedure Laterality Date   BASAL CELL CARCINOMA EXCISION     TONSILLECTOMY AND ADENOIDECTOMY     Family History  Problem  Relation Age of Onset   Cancer Mother        lung   Breast cancer Maternal Aunt    Social History   Socioeconomic History   Marital status: Married    Spouse name: Not on file   Number of children: Not on file   Years of education: Not on file   Highest education level: Bachelor's degree (e.g., BA, AB, BS)  Occupational History   Not on file  Tobacco Use   Smoking status: Never   Smokeless tobacco: Never  Vaping Use   Vaping status: Never Used  Substance and Sexual Activity   Alcohol use: Yes    Comment: on occasion   Drug use: No   Sexual activity: Not on file  Other Topics Concern   Not on file  Social History Narrative   Married   Social Drivers of Health   Financial Resource Strain: Low Risk  (10/09/2023)   Overall Financial Resource Strain (CARDIA)    Difficulty of Paying Living Expenses: Not hard at all  Food Insecurity: No Food Insecurity (10/09/2023)   Hunger Vital Sign    Worried About Running Out of Food in the Last Year: Never true    Ran Out of Food in the Last Year: Never true  Transportation Needs: No Transportation Needs (10/09/2023)   PRAPARE - Administrator, Civil Service (Medical): No    Lack of Transportation (Non-Medical): No  Physical Activity: Insufficiently Active (10/09/2023)   Exercise Vital Sign    Days of Exercise per Week: 3 days    Minutes of Exercise per Session: 20 min  Stress: No Stress Concern Present (10/09/2023)   [employer] of Occupational Health - Occupational Stress Questionnaire    Feeling of Stress: Not at all  Social Connections: Socially Integrated (10/09/2023)   Social Connection and Isolation Panel    Frequency of Communication with Friends and Family: Twice a week    Frequency of Social Gatherings with Friends and Family: Twice a week    Attends Religious Services: More than 4 times per year    Active Member of [employer] or Organizations: Yes    Attends Engineer, structural: More than 4 times per  year    Marital Status: Married    Tobacco Counseling Counseling given: Not Answered    Clinical Intake:  Pre-visit preparation completed: Yes  Pain : No/denies pain     BMI - recorded: 24.37 Nutritional Status: BMI of 19-24  Normal Nutritional Risks: None Diabetes: No  Lab Results  Component Value Date   HGBA1C 5.8 01/29/2023   HGBA1C 5.8 01/10/2022     How often do you need to have someone help you when you read instructions, pamphlets, or other written materials from your doctor or pharmacy?: 1 - Never  Interpreter Needed?: No  Information entered by :: R. Hrishikesh Hoeg LPN   Activities of Daily Living     10/09/2023    3:50 PM  10/10/2022    8:21 AM  In your present state of health, do you have any difficulty performing the following activities:  Hearing? 1 1  Comment  some hearing loss  Vision? 0 0  Comment  glasses  Difficulty concentrating or making decisions? 0 0  Walking or climbing stairs? 1 0  Dressing or bathing? 0 0  Doing errands, shopping? 0 0  Preparing Food and eating ? N N  Using the Toilet? N N  In the past six months, have you accidently leaked urine? N N  Do you have problems with loss of bowel control? N N  Managing your Medications? N N  Managing your Finances? N N  Housekeeping or managing your Housekeeping? N N    Patient Care Team: Marylynn Verneita CROME, MD as PCP - General (Internal Medicine) Verta Royden DASEN, DPM as Consulting Physician (Podiatry) Hester Alm BROCKS, MD (Dermatology)  I have updated your Care Teams any recent Medical Services you may have received from other providers in the past year.     Assessment:   This is a routine wellness examination for Khadeeja.  Hearing/Vision screen Hearing Screening - Comments:: Some issues Vision Screening - Comments:: glasses   Goals Addressed             This Visit's Progress    Patient Stated       Wants to walk more       Depression Screen     10/09/2023    3:59 PM 09/26/2023     2:52 PM 09/19/2023    2:04 PM 09/12/2023    9:51 AM 07/30/2023   10:34 AM 04/30/2023    2:13 PM 01/29/2023   10:55 AM  PHQ 2/9 Scores  PHQ - 2 Score 0 0 3 0 0 0 0  PHQ- 9 Score 0 1 6 2        Fall Risk     10/09/2023    3:53 PM 09/26/2023    2:51 PM 09/19/2023    2:04 PM 09/12/2023    9:51 AM 07/30/2023   10:33 AM  Fall Risk   Falls in the past year? 0 0 0 0 0  Number falls in past yr: 0 0 0 0 0  Injury with Fall? 0 0 0 0 0  Risk for fall due to : No Fall Risks No Fall Risks No Fall Risks No Fall Risks No Fall Risks  Follow up Falls evaluation completed;Falls prevention discussed Falls evaluation completed Falls evaluation completed Falls evaluation completed;Education provided Falls evaluation completed    MEDICARE RISK AT HOME:  Medicare Risk at Home Any stairs in or around the home?: Yes If so, are there any without handrails?: No Home free of loose throw rugs in walkways, pet beds, electrical cords, etc?: Yes Adequate lighting in your home to reduce risk of falls?: Yes Life alert?: No Use of a cane, walker or w/c?: No Grab bars in the bathroom?: No Shower chair or bench in shower?: Yes Elevated toilet seat or a handicapped toilet?: No  TIMED UP AND GO:  Was the test performed?  No  Cognitive Function: 6CIT completed    10/01/2019   10:55 AM  MMSE - Mini Mental State Exam  Not completed: Unable to complete        10/09/2023    4:06 PM 10/10/2022    8:35 AM 09/30/2018   10:56 AM  6CIT Screen  What Year? 0 points 0 points 0 points  What month? 0 points  0 points 0 points  What time? 0 points 0 points 0 points  Count back from 20 0 points 0 points 0 points  Months in reverse 0 points 0 points 0 points  Repeat phrase 0 points 0 points 0 points  Total Score 0 points 0 points 0 points    Immunizations Immunization History  Administered Date(s) Administered   Fluad Quad(high Dose 65+) 11/22/2018, 11/15/2020   Influenza Split 01/13/2016   Influenza-Unspecified  12/13/2016, 12/31/2017, 12/18/2021, 11/09/2022   PFIZER(Purple Top)SARS-COV-2 Vaccination 04/10/2019, 05/05/2019, 11/27/2019   PNEUMOCOCCAL CONJUGATE-20 06/15/2020   Pfizer Covid-19 Vaccine Bivalent Booster 22yrs & up 11/21/2020   Pfizer(Comirnaty)Fall Seasonal Vaccine 12 years and older 06/12/2022   Pneumococcal Conjugate-13 12/11/2018   Td 06/25/2005   Tdap 10/17/2015   Zoster Recombinant(Shingrix) 12/22/2018, 03/09/2019   Zoster, Live 09/06/2010    Screening Tests Health Maintenance  Topic Date Due   COVID-19 Vaccine (6 - 2024-25 season) 10/28/2022   INFLUENZA VACCINE  09/27/2023   Medicare Annual Wellness (AWV)  10/10/2023   MAMMOGRAM  03/27/2024   DTaP/Tdap/Td (3 - Td or Tdap) 10/16/2025   Colonoscopy  10/08/2028   Pneumococcal Vaccine: 50+ Years  Completed   DEXA SCAN  Completed   Hepatitis C Screening  Completed   Zoster Vaccines- Shingrix  Completed   Hepatitis B Vaccines  Aged Out   HPV VACCINES  Aged Out   Meningococcal B Vaccine  Aged Out    Health Maintenance  Health Maintenance Due  Topic Date Due   COVID-19 Vaccine (6 - 2024-25 season) 10/28/2022   INFLUENZA VACCINE  09/27/2023   Medicare Annual Wellness (AWV)  10/10/2023   Health Maintenance Items Addressed: Discussed the need to update flu and covid vaccines annually  Additional Screening:  Vision Screening: Recommended annual ophthalmology exams for early detection of glaucoma and other disorders of the eye. Up to date Dr. Laurice Would you like a referral to an eye doctor? No    Dental Screening: Recommended annual dental exams for proper oral hygiene  Community Resource Referral / Chronic Care Management: CRR required this visit?  No   CCM required this visit?  No   Plan:    I have personally reviewed and noted the following in the patient's chart:   Medical and social history Use of alcohol, tobacco or illicit drugs  Current medications and supplements including opioid prescriptions.  Patient is not currently taking opioid prescriptions. Functional ability and status Nutritional status Physical activity Advanced directives List of other physicians Hospitalizations, surgeries, and ER visits in previous 12 months Vitals Screenings to include cognitive, depression, and falls Referrals and appointments  In addition, I have reviewed and discussed with patient certain preventive protocols, quality metrics, and best practice recommendations. A written personalized care plan for preventive services as well as general preventive health recommendations were provided to patient.   Angeline Fredericks, LPN   [id number]   After Visit Summary: (MyChart) Due to this being a telephonic visit, the after visit summary with patients personalized plan was offered to patient via MyChart   Notes: Nothing significant to report at this time.

## 2023-10-09 NOTE — Patient Instructions (Signed)
 Terri Melendez , Thank you for taking time out of your busy schedule to complete your Annual Wellness Visit with me. I enjoyed our conversation and look forward to speaking with you again next year. I, as well as your care team,  appreciate your ongoing commitment to your health goals. Please review the following plan we discussed and let me know if I can assist you in the future. Your Game plan/ To Do List    Referrals: If you haven't heard from the office you've been referred to, please reach out to them at the phone provided.  Remember to update your flu and covid vaccines annually   Follow up Visits: We will see or speak with you next year for your Next Medicare AWV with our clinical staff 10/13/24 @ 10:10 Have you seen your provider in the last 6 months (3 months if uncontrolled diabetes)? Yes  Clinician Recommendations:  Aim for 30 minutes of exercise or brisk walking, 6-8 glasses of water, and 5 servings of fruits and vegetables each day.       This is a list of the screenings recommended for you:  Health Maintenance  Topic Date Due   COVID-19 Vaccine (6 - 2024-25 season) 10/28/2022   Flu Shot  09/27/2023   Mammogram  03/27/2024   Medicare Annual Wellness Visit  10/08/2024   DTaP/Tdap/Td vaccine (3 - Td or Tdap) 10/16/2025   Colon Cancer Screening  10/08/2028   Pneumococcal Vaccine for age over 71  Completed   DEXA scan (bone density measurement)  Completed   Hepatitis C Screening  Completed   Zoster (Shingles) Vaccine  Completed   Hepatitis B Vaccine  Aged Out   HPV Vaccine  Aged Out   Meningitis B Vaccine  Aged Out    Advanced directives: (In Chart) A copy of your advanced directives are scanned into your chart should your provider ever need it. Advance Care Planning is important because it:  [x]  Makes sure you receive the medical care that is consistent with your values, goals, and preferences  [x]  It provides guidance to your family and loved ones and reduces their  decisional burden about whether or not they are making the right decisions based on your wishes.  Follow the link provided in your after visit summary or read over the paperwork we have mailed to you to help you started getting your Advance Directives in place. If you need assistance in completing these, please reach out to us  so that we can help you!

## 2023-10-15 ENCOUNTER — Other Ambulatory Visit

## 2023-10-16 DIAGNOSIS — R42 Dizziness and giddiness: Secondary | ICD-10-CM | POA: Diagnosis not present

## 2023-10-17 ENCOUNTER — Encounter: Admitting: Physical Therapy

## 2023-10-23 ENCOUNTER — Encounter: Payer: Self-pay | Admitting: Internal Medicine

## 2023-10-23 ENCOUNTER — Ambulatory Visit: Admitting: Internal Medicine

## 2023-10-23 VITALS — BP 152/63 | HR 75 | Ht 64.0 in | Wt 142.4 lb

## 2023-10-23 DIAGNOSIS — R002 Palpitations: Secondary | ICD-10-CM | POA: Diagnosis not present

## 2023-10-23 DIAGNOSIS — I2584 Coronary atherosclerosis due to calcified coronary lesion: Secondary | ICD-10-CM | POA: Diagnosis not present

## 2023-10-23 DIAGNOSIS — M175 Other unilateral secondary osteoarthritis of knee: Secondary | ICD-10-CM | POA: Diagnosis not present

## 2023-10-23 DIAGNOSIS — I10 Essential (primary) hypertension: Secondary | ICD-10-CM | POA: Diagnosis not present

## 2023-10-23 DIAGNOSIS — N1831 Chronic kidney disease, stage 3a: Secondary | ICD-10-CM

## 2023-10-23 MED ORDER — ATORVASTATIN CALCIUM 20 MG PO TABS: 20.0000 mg | ORAL_TABLET | Freq: Every day | ORAL | 3 refills | Status: AC

## 2023-10-23 MED ORDER — CETIRIZINE HCL 10 MG PO TABS: 10.0000 mg | ORAL_TABLET | Freq: Every day | ORAL | 1 refills | Status: DC

## 2023-10-23 MED ORDER — TELMISARTAN 20 MG PO TABS: 20.0000 mg | ORAL_TABLET | Freq: Every day | ORAL | 1 refills | Status: DC

## 2023-10-23 NOTE — Patient Instructions (Addendum)
 Resume medication to lower BP with 20 mg telmisartan  dose daily (reduced  dose)   Goal BP is 120/70 to 130/80  on the majority of days.  You may increase the dose to 40  mg after 2 weeks if your readings are not at goal,  but LET ME KNOW so I can update the rx    Knee pain  Avoid aleve and motrin,  hard on kidneys    You can take 3000 mg of acetominophen (tylenol ) every day safely  In divided doses (750 mg  every 6 hours  Or 1000 mg every 8 hours.)    Ok to add topical voltaren and asperceme for more relief

## 2023-10-23 NOTE — Progress Notes (Unsigned)
 Subjective:  Patient ID: Terri Melendez, female    DOB: 1950/11/16  Age: 73 y.o. MRN: [id number]  CC: There were no encounter diagnoses.   HPI STEELE LEDONNE presents for  Chief Complaint  Patient presents with   Medical Management of Chronic Issues    1 month follow up on hypertension    Follow up on hypertension :  patient  has been taking telmisartan /hct  for several years,  but was treated in July for home reports of hypotension and dizziness.  ZIO monitor was ordered and medication was suspended.  Home machine has been validated and most recent has been elevated to 150 systolic   Results of ZIO were unrevealing and she has seen cardiology Marsa):  HR 53 to 218, average 76. 38 nonsustained SVT, longest 11 beats. Rare supraventricular and ventricular ectopy. No sustained arrhythmias. No atrial fibrillation. Symptom trigger episodes correspond to sinus rhythm.   CKD: referred to [facility].  Has not received the results   Right knee pain:  imporved with PT but then became more painful,  was seen by [facility] and given I/A steroid injection which provided transient relief   Outpatient Medications Prior to Visit  Medication Sig Dispense Refill   atorvastatin  (LIPITOR) 20 MG tablet TAKE 1 TABLET BY MOUTH DAILY 90 tablet 3   cetirizine  (ZYRTEC ) 10 MG tablet Take 1 tablet (10 mg total) by mouth daily. 90 tablet 1   cholecalciferol  (VITAMIN D3) 25 MCG (1000 UNIT) tablet Take 1,000 Units by mouth daily.     mometasone  (ELOCON ) 0.1 % lotion Apply topically daily as needed.     mometasone  (NASONEX ) 50 MCG/ACT nasal spray Place 2 sprays into the nose daily. 17 g 11   No facility-administered medications prior to visit.    Review of Systems;  Patient denies headache, fevers, malaise, unintentional weight loss, skin rash, eye pain, sinus congestion and sinus pain, sore throat, dysphagia,  hemoptysis , cough, dyspnea, wheezing, chest pain, palpitations, orthopnea,  edema, abdominal pain, nausea, melena, diarrhea, constipation, flank pain, dysuria, hematuria, urinary  Frequency, nocturia, numbness, tingling, seizures,  Focal weakness, Loss of consciousness,  Tremor, insomnia, depression, anxiety, and suicidal ideation.      Objective:  BP (!) 152/63   Pulse 75   Ht 5' 4 (1.626 m)   Wt 142 lb 6.4 oz (64.6 kg)   SpO2 99%   BMI 24.44 kg/m   BP Readings from Last 3 Encounters:  10/23/23 (!) 152/63  10/09/23 131/64  09/26/23 118/74    Wt Readings from Last 3 Encounters:  10/23/23 142 lb 6.4 oz (64.6 kg)  10/09/23 142 lb (64.4 kg)  09/26/23 142 lb 6.4 oz (64.6 kg)    Physical Exam  Lab Results  Component Value Date   HGBA1C 5.8 01/29/2023   HGBA1C 5.8 01/10/2022    Lab Results  Component Value Date   CREATININE 0.84 09/20/2023   CREATININE 0.87 09/15/2023   CREATININE 0.85 09/12/2023    Lab Results  Component Value Date   WBC 8.3 09/15/2023   HGB 12.4 09/15/2023   HCT 36.6 09/15/2023   PLT 245 09/15/2023   GLUCOSE 153 (H) 09/20/2023   CHOL 156 07/30/2023   TRIG 70.0 07/30/2023   HDL 58.70 07/30/2023   LDLDIRECT 82.0 07/30/2023   LDLCALC 83 07/30/2023   ALT 12 09/12/2023   AST 18 09/12/2023   NA 139 09/20/2023   K 3.8 09/20/2023   CL 106 09/20/2023   CREATININE 0.84 09/20/2023  BUN 21 09/20/2023   CO2 25 09/20/2023   TSH 1.03 09/12/2023   HGBA1C 5.8 01/29/2023   MICROALBUR <0.7 07/30/2023    US  RENAL Result Date: 09/21/2023 CLINICAL DATA:  Stage III chronic renal disease. EXAM: RENAL / URINARY TRACT ULTRASOUND COMPLETE COMPARISON:  None Available. FINDINGS: Right Kidney: Renal measurements: 8.6 cm x 4.1 cm x 5.4 cm = volume: 98 mL. Mildly increased echogenicity of the renal parenchyma is seen. No mass or hydronephrosis visualized. Left Kidney: Renal measurements: 10.4 cm x 5.2 cm x 5.3 cm = volume: 150 mL. Echogenicity within normal limits. No mass or hydronephrosis visualized. Bladder: Appears normal for degree of  bladder distention. Other: None. IMPRESSION: Mildly echogenic right kidney which may represent sequelae associated with medical renal disease. Electronically Signed   By: Suzen Dials M.D.   On: 09/21/2023 21:16    Assessment & Plan:  .There are no diagnoses linked to this encounter.   I spent 34 minutes on the day of this face to face encounter reviewing patient's  most recent visit with cardiology,  nephrology,  and neurology,  prior relevant surgical and non surgical procedures, recent  labs and imaging studies, counseling on weight management,  reviewing the assessment and plan with patient, and post visit ordering and reviewing of  diagnostics and therapeutics with patient  .   Follow-up: No follow-ups on file.   Verneita LITTIE Kettering, MD

## 2023-10-24 ENCOUNTER — Other Ambulatory Visit

## 2023-10-25 ENCOUNTER — Ambulatory Visit: Attending: Cardiovascular Disease

## 2023-10-25 DIAGNOSIS — N183 Chronic kidney disease, stage 3 unspecified: Secondary | ICD-10-CM | POA: Insufficient documentation

## 2023-10-25 DIAGNOSIS — R0602 Shortness of breath: Secondary | ICD-10-CM

## 2023-10-25 NOTE — Assessment & Plan Note (Signed)
 She has been asymptomatic.  Continue atorvastatin   follow up with dr Darron for ECHO

## 2023-10-25 NOTE — Assessment & Plan Note (Addendum)
 She has had orthopedic evaluation and treatement by [facility]: plain films noted mild joint space narrowing of the medial compartment with mild medial femoral condylar spurring and mild sclerotic changes medial tibial plateau. 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 Mid PA flexion view shows near complete loss of joint space in the medial compartment of the right kneehe has had 2 steroid injections thus far and is considering gel injections

## 2023-10-25 NOTE — Assessment & Plan Note (Addendum)
 Advised to resume therapy with 20 mg telmisartan 

## 2023-10-25 NOTE — Assessment & Plan Note (Signed)
 ZIO monitor worn July 2025 was negative for sustained arrhythmias

## 2023-10-25 NOTE — Assessment & Plan Note (Addendum)
 GFR is stable. She is avoiding NSAIDS.  Continue BP management with ARB to keep BP < 130/80.  Renal ultrasound reviewed with patient today .  Lab Results  Component Value Date   CREATININE 0.84 09/20/2023   Lab Results  Component Value Date   LABMICR See below: 10/22/2017   LABMICR See below: 10/17/2016   MICROALBUR <0.7 07/30/2023

## 2023-10-26 ENCOUNTER — Encounter: Payer: Self-pay | Admitting: Internal Medicine

## 2023-10-26 DIAGNOSIS — I10 Essential (primary) hypertension: Secondary | ICD-10-CM

## 2023-10-28 LAB — ECHOCARDIOGRAM COMPLETE
AR max vel: 2.55 cm2
AV Area VTI: 2.72 cm2
AV Area mean vel: 2.5 cm2
AV Mean grad: 3 mmHg
AV Peak grad: 5.9 mmHg
Ao pk vel: 1.21 m/s
Area-P 1/2: 3.42 cm2
S' Lateral: 2.18 cm

## 2023-10-29 MED ORDER — AMLODIPINE BESYLATE 2.5 MG PO TABS: 2.5000 mg | ORAL_TABLET | Freq: Every day | ORAL | 0 refills | Status: DC

## 2023-10-29 NOTE — Telephone Encounter (Signed)
 Appt made.

## 2023-10-29 NOTE — Assessment & Plan Note (Signed)
 She is intolerant when pressure drsops below 110 ( felt weak with bp of 104/68); stopping  20 mg telmisartan   ,  needs RN   vist in 2 weeks to chck BP and orthostatics

## 2023-11-01 ENCOUNTER — Ambulatory Visit: Payer: Self-pay | Admitting: Cardiovascular Disease

## 2023-11-12 ENCOUNTER — Ambulatory Visit (INDEPENDENT_AMBULATORY_CARE_PROVIDER_SITE_OTHER)

## 2023-11-12 VITALS — BP 132/72 | HR 80

## 2023-11-12 DIAGNOSIS — Z23 Encounter for immunization: Secondary | ICD-10-CM | POA: Diagnosis not present

## 2023-11-12 DIAGNOSIS — I1A0 Resistant hypertension: Secondary | ICD-10-CM

## 2023-11-12 NOTE — Progress Notes (Signed)
 Patient here for nurse visit BP check per order from Dr Tullo.   Patient reports compliance with prescribed BP medications: yes  Last dose of BP medication: 11/12/2023  BP Readings from Last 3 Encounters:  11/12/23 132/72  10/23/23 (!) 152/63  10/09/23 131/64   Pulse Readings from Last 3 Encounters:  11/12/23 80  10/23/23 75  09/26/23 75    Per Dr. Marylynn just continue medication regimen as instructed  Pt also presented in office for High dose Flu vaccine  in Left Deltoid pt showed no signs of distress or concern during injection

## 2023-11-19 ENCOUNTER — Ambulatory Visit: Attending: Nurse Practitioner | Admitting: Nurse Practitioner

## 2023-11-19 ENCOUNTER — Encounter: Payer: Self-pay | Admitting: Nurse Practitioner

## 2023-11-19 VITALS — BP 116/62 | HR 82 | Ht 64.0 in | Wt 140.0 lb

## 2023-11-19 DIAGNOSIS — R002 Palpitations: Secondary | ICD-10-CM | POA: Diagnosis not present

## 2023-11-19 DIAGNOSIS — I10 Essential (primary) hypertension: Secondary | ICD-10-CM

## 2023-11-19 DIAGNOSIS — G4733 Obstructive sleep apnea (adult) (pediatric): Secondary | ICD-10-CM

## 2023-11-19 DIAGNOSIS — E7800 Pure hypercholesterolemia, unspecified: Secondary | ICD-10-CM | POA: Diagnosis not present

## 2023-11-19 NOTE — Progress Notes (Signed)
 Office Visit    Patient Name: Terri Melendez Date of Encounter: 11/19/2023  Primary Care Provider:  Marylynn Verneita CROME, MD Primary Cardiologist:  Deatrice Cage, MD  Cardiology APP:  Vivienne Lonni Ingle, NP   Chief Complaint    73 y.o. female with a history of atypical chest pain and normal stress testing (2018), hypertension, hyperlipidemia, GERD, palpitations, and dizziness, who presents for follow-up after recent event monitoring.  Past Medical History   Subjective   Past Medical History:  Diagnosis Date   Actinic keratosis    Allergy    dust   Atypical chest pain    a. 05/2016 MV: EF 70%, no ischemia/infarct.   Cancer (HCC)    basal cell on face in 2000 or 2001   Diastolic dysfunction    a. 05/2016 Echo: EF>55%, mild MR; b. 09/2023 Echo: EF 60-65%, no orwma, GrI DD, nl RV fxn, mild TR.   GERD (gastroesophageal reflux disease)    on medication Dr. Herminio   Hx of basal cell carcinoma 2002   right cheek    Hx of basal cell carcinoma 02/11/2020   glabella, Mohs with Dr. Bluford 06/03/2020   Hypertension    on medication Dr. Marylynn   Osteopenia    Palpitations    a. 08/2023 Zio: predominantly sinus rhythm, avg 76 (53-218). Rare PACs/PVCs. 38 runs of SVT, longest 11 beats. No sustained arrhythmias/pauses. Triggers = sinus rhythm.   Renal disorder    recent bloodwork (2025) showing GFR under 60   Sinus congestion    Sleep apnea    use CPAP   Past Surgical History:  Procedure Laterality Date   BASAL CELL CARCINOMA EXCISION     TONSILLECTOMY AND ADENOIDECTOMY      Allergies  Allergies  Allergen Reactions   Dust Mite Extract        History of Present Illness      73 y.o. y/o female with a history of atypical chest pain and normal stress testing in 2018, hypertension, hyperlipidemia, GERD, palpitations, and dizziness.  As noted, she was previously evaluated by Dr. Florencio in 2018 for atypical chest pain.  Stress testing and echocardiogram at that time  were unremarkable.  In July 2025, she established care with Dr. Cage in the setting of dizziness, lightheadedness, weak spells associated with low blood pressures (97/74), and palpitations.  She was wearing a ZIO monitor at that time and this subsequently showed predominantly sinus rhythm at an average rate of 76 with 38 brief runs of SVT lasting up to 11 beats, rare PACs/PVCs, and no sustained arrhythmias or pauses.  Triggered events were associated with sinus rhythm.  Echocardiogram in August 2025 showed an EF of 60-65% with grade 1 diastolic dysfunction, normal RV function, and no significant valvular disease.   Since her last visit, [name] has felt well.  She denies any recurrence of palpitations or lightheadedness.  She notes that her blood pressure is under better control following the recent addition of amlodipine  earlier this month, which she has tolerated well.  We discussed her test results in detail today.  She denies chest pain, dyspnea, palpitations, PND, orthopnea, dizziness, syncope, or early satiety.  She sometimes notes swelling in her right foot at the end of the day, which has been associated with right knee swelling after twisting her right knee earlier this year. Objective   Home Medications    Current Outpatient Medications  Medication Sig Dispense Refill   amLODipine  (NORVASC ) 2.5 MG tablet Take  1 tablet (2.5 mg total) by mouth daily. 30 tablet 0   atorvastatin  (LIPITOR) 20 MG tablet Take 1 tablet (20 mg total) by mouth daily. 90 tablet 3   cetirizine  (ZYRTEC ) 10 MG tablet Take 1 tablet (10 mg total) by mouth daily. 90 tablet 1   cholecalciferol  (VITAMIN D3) 25 MCG (1000 UNIT) tablet Take 1,000 Units by mouth daily.     mometasone  (ELOCON ) 0.1 % lotion Apply topically daily as needed.     mometasone  (NASONEX ) 50 MCG/ACT nasal spray Place 2 sprays into the nose daily. 17 g 11   telmisartan  (MICARDIS ) 20 MG tablet Take 1 tablet (20 mg total) by mouth daily. 90 tablet 1    No current facility-administered medications for this visit.     Physical Exam    VS:  BP 116/62 (BP Location: Left Arm, Patient Position: Sitting)   Pulse 82   Ht 5' 4 (1.626 m)   Wt 140 lb (63.5 kg)   SpO2 98%   BMI 24.03 kg/m  , BMI Body mass index is 24.03 kg/m.          GEN: Well nourished, well developed, in no acute distress. HEENT: normal. Neck: Supple, no JVD, carotid bruits, or masses. Cardiac: RRR, no murmurs, rubs, or gallops. No clubbing, cyanosis, edema.  Radials 2+/PT 2+ and equal bilaterally.  Respiratory:  Respirations regular and unlabored, clear to auscultation bilaterally. GI: Soft, nontender, nondistended, BS + x 4. MS: no deformity or atrophy. Skin: warm and dry, no rash. Neuro:  Strength and sensation are intact. Psych: Normal affect.  Accessory Clinical Findings    Lab Results  Component Value Date   WBC 8.3 09/15/2023   HGB 12.4 09/15/2023   HCT 36.6 09/15/2023   MCV 89.3 09/15/2023   PLT 245 09/15/2023   Lab Results  Component Value Date   CREATININE 0.84 09/20/2023   BUN 21 09/20/2023   NA 139 09/20/2023   K 3.8 09/20/2023   CL 106 09/20/2023   CO2 25 09/20/2023   Lab Results  Component Value Date   ALT 12 09/12/2023   AST 18 09/12/2023   GGT 15 10/10/2016   ALKPHOS 102 09/12/2023   BILITOT 0.5 09/12/2023   Lab Results  Component Value Date   CHOL 156 07/30/2023   HDL 58.70 07/30/2023   LDLCALC 83 07/30/2023   LDLDIRECT 82.0 07/30/2023   TRIG 70.0 07/30/2023   CHOLHDL 3 07/30/2023    Lab Results  Component Value Date   HGBA1C 5.8 01/29/2023   Lab Results  Component Value Date   TSH 1.03 09/12/2023       Assessment & Plan    1.  Palpitations: Patient evaluated over the summer with complaints of palpitations and lightheadedness.  Subsequent event monitoring revealed predominantly sinus rhythm with 38 brief runs of SVT lasting up to 11 beats as well as rare PACs/PVCs.  Interestingly, SVT did not cause symptoms and  triggered events were associated with sinus rhythm.  We discussed findings of her monitoring in detail today.  She has not had any palpitations since her prior visit.  Reassurance provided.  We discussed that if she has recurrence of palpitations, that we would consider beta-blocker and/or repeat monitoring in the future.  2.  Primary hypertension/diastolic dysfunction: Patient notes improvement in blood pressure management following recent addition of amlodipine  2.5 mg daily.  Pressures have been trending less than 130 systolic at home.  She is 116/62 today.  Continue current doses of telmisartan  and amlodipine .  3.  Hyperlipidemia: On atorvastatin  20 mg daily with an LDL of 83 in June.  4.  Obstructive sleep apnea: Compliant with CPAP.    5.  Disposition: Follow-up in 6 months or sooner if necessary.  Lonni Meager, NP 11/19/2023, 12:04 PM

## 2023-11-19 NOTE — Patient Instructions (Signed)
 Medication Instructions:  Your physician recommends that you continue on your current medications as directed. Please refer to the Current Medication list given to you today.  *If you need a refill on your cardiac medications before your next appointment, please call your pharmacy*  Lab Work: No labs ordered today  If you have labs (blood work) drawn today and your tests are completely normal, you will receive your results only by: MyChart Message (if you have MyChart) OR A paper copy in the mail If you have any lab test that is abnormal or we need to change your treatment, we will call you to review the results.  Testing/Procedures: No test ordered today   Follow-Up: At [facility], you and your health needs are our priority.  As part of our continuing mission to provide you with exceptional heart care, our providers are all part of one team.  This team includes your primary Cardiologist (physician) and Advanced Practice Providers or APPs (Physician Assistants and Nurse Practitioners) who all work together to provide you with the care you need, when you need it.  Your next appointment:   6 month(s)  Provider:   You may see Deatrice Cage, MD or one of the following Advanced Practice Providers on your designated Care Team:   Lonni Meager, NP  We recommend signing up for the patient portal called MyChart.  Sign up information is provided on this After Visit Summary.  MyChart is used to connect with patients for Virtual Visits (Telemedicine).  Patients are able to view lab/test results, encounter notes, upcoming appointments, etc.  Non-urgent messages can be sent to your provider as well.   To learn more about what you can do with MyChart, go to [email or web address].

## 2023-11-25 ENCOUNTER — Other Ambulatory Visit: Payer: Self-pay | Admitting: Internal Medicine

## 2023-11-26 ENCOUNTER — Telehealth: Payer: Self-pay | Admitting: Internal Medicine

## 2023-11-26 NOTE — Telephone Encounter (Signed)
 Placed in yellow results folder.

## 2023-11-26 NOTE — Telephone Encounter (Signed)
 Pt came into the office to drop off some forms for Dr. Tullo and forms has been placed in the back mail box. Thanks

## 2023-11-27 ENCOUNTER — Other Ambulatory Visit: Payer: Self-pay | Admitting: Internal Medicine

## 2023-11-27 ENCOUNTER — Encounter: Payer: Self-pay | Admitting: Internal Medicine

## 2023-11-27 DIAGNOSIS — I10 Essential (primary) hypertension: Secondary | ICD-10-CM

## 2023-11-27 NOTE — Assessment & Plan Note (Signed)
 She is  SYMPTOMATIC  when pressure drops below 110 ( felt weak with bp of 104/68); so we stopped telmisartan  on Sept 1.  And started amlodipine  on Sept. 4  .  Home readings reviewed,  only one < 120/70 in the last week of September

## 2023-12-05 ENCOUNTER — Other Ambulatory Visit: Payer: Self-pay | Admitting: Internal Medicine

## 2023-12-12 ENCOUNTER — Telehealth: Payer: Self-pay | Admitting: Internal Medicine

## 2023-12-12 NOTE — Telephone Encounter (Signed)
 Placed in yellow results folder for review.

## 2023-12-12 NOTE — Telephone Encounter (Signed)
 Pt has dropped off a list of BP readings for Dr Marylynn. It's in her color folder up front

## 2023-12-13 NOTE — Telephone Encounter (Signed)
 Spoke with pt and advised her to only check her bp once daily in the morning for about a week and then if her bp readings are greater than 130/80 then she will need to start on the 2.5 mg amlodipine . Pt gave a verbal understanding.

## 2023-12-23 ENCOUNTER — Other Ambulatory Visit: Payer: Self-pay

## 2023-12-23 ENCOUNTER — Telehealth: Payer: Self-pay

## 2023-12-23 DIAGNOSIS — I10 Essential (primary) hypertension: Secondary | ICD-10-CM

## 2023-12-23 MED ORDER — CETIRIZINE HCL 10 MG PO TABS: 10.0000 mg | ORAL_TABLET | Freq: Every day | ORAL | 1 refills | Status: AC

## 2023-12-23 NOTE — Telephone Encounter (Signed)
 Pt dropped off bp readings and letter for review. I have placed in yellow results folder.

## 2023-12-24 NOTE — Assessment & Plan Note (Signed)
 Resume 2.5 mg amlodipine  daily ,  bps are > 140 most days in October

## 2023-12-25 MED ORDER — AMLODIPINE BESYLATE 2.5 MG PO TABS: 2.5000 mg | ORAL_TABLET | Freq: Every day | ORAL | 1 refills | Status: AC

## 2023-12-25 NOTE — Telephone Encounter (Signed)
 Pt is aware that she will need to resume the amlodipine  2.5 mg daily. Rx has been sent to pharmacy.

## 2023-12-25 NOTE — Addendum Note (Signed)
 Addended by: HARRIETTE RAISIN on: 12/25/2023 09:41 AM   Modules accepted: Orders

## 2024-02-24 ENCOUNTER — Other Ambulatory Visit: Payer: Self-pay | Admitting: Internal Medicine

## 2024-02-24 DIAGNOSIS — Z1231 Encounter for screening mammogram for malignant neoplasm of breast: Secondary | ICD-10-CM

## 2024-03-09 ENCOUNTER — Telehealth: Payer: Self-pay

## 2024-03-09 NOTE — Telephone Encounter (Signed)
 Medical records from [facility] scanned into media.

## 2024-03-19 ENCOUNTER — Ambulatory Visit: Payer: Medicare HMO | Admitting: Dermatology

## 2024-03-31 ENCOUNTER — Ambulatory Visit
Admission: RE | Admit: 2024-03-31 | Discharge: 2024-03-31 | Disposition: A | Source: Ambulatory Visit | Attending: Internal Medicine | Admitting: Internal Medicine

## 2024-03-31 DIAGNOSIS — Z1231 Encounter for screening mammogram for malignant neoplasm of breast: Secondary | ICD-10-CM

## 2024-04-28 ENCOUNTER — Ambulatory Visit: Admitting: Internal Medicine

## 2024-05-06 ENCOUNTER — Ambulatory Visit: Admitting: Nurse Practitioner

## 2024-05-26 ENCOUNTER — Ambulatory Visit: Admitting: Internal Medicine

## 2024-10-13 ENCOUNTER — Ambulatory Visit
